# Patient Record
Sex: Female | Born: 1935 | Race: White | Hispanic: No | Smoking: Former smoker
Health system: Southern US, Community
[De-identification: ages and names within clinical notes are randomized; demographics above are authoritative.]

## PROBLEM LIST (undated history)

## (undated) DIAGNOSIS — K219 Gastro-esophageal reflux disease without esophagitis: Secondary | ICD-10-CM

## (undated) DIAGNOSIS — J449 Chronic obstructive pulmonary disease, unspecified: Secondary | ICD-10-CM

## (undated) DIAGNOSIS — K5900 Constipation, unspecified: Secondary | ICD-10-CM

## (undated) DIAGNOSIS — I10 Essential (primary) hypertension: Secondary | ICD-10-CM

## (undated) DIAGNOSIS — I739 Peripheral vascular disease, unspecified: Secondary | ICD-10-CM

## (undated) DIAGNOSIS — E782 Mixed hyperlipidemia: Secondary | ICD-10-CM

## (undated) DIAGNOSIS — I340 Nonrheumatic mitral (valve) insufficiency: Secondary | ICD-10-CM

## (undated) DIAGNOSIS — G4762 Sleep related leg cramps: Secondary | ICD-10-CM

## (undated) DIAGNOSIS — E039 Hypothyroidism, unspecified: Secondary | ICD-10-CM

## (undated) DIAGNOSIS — F419 Anxiety disorder, unspecified: Secondary | ICD-10-CM

## (undated) DIAGNOSIS — R0602 Shortness of breath: Secondary | ICD-10-CM

## (undated) DIAGNOSIS — I2510 Atherosclerotic heart disease of native coronary artery without angina pectoris: Secondary | ICD-10-CM

## (undated) DIAGNOSIS — Z9289 Personal history of other medical treatment: Secondary | ICD-10-CM

## (undated) DIAGNOSIS — N390 Urinary tract infection, site not specified: Secondary | ICD-10-CM

## (undated) DIAGNOSIS — IMO0002 Reserved for concepts with insufficient information to code with codable children: Secondary | ICD-10-CM

## (undated) DIAGNOSIS — Z860100 Personal history of colon polyps, unspecified: Secondary | ICD-10-CM

## (undated) DIAGNOSIS — J45909 Unspecified asthma, uncomplicated: Secondary | ICD-10-CM

## (undated) DIAGNOSIS — M069 Rheumatoid arthritis, unspecified: Secondary | ICD-10-CM

## (undated) DIAGNOSIS — H9190 Unspecified hearing loss, unspecified ear: Secondary | ICD-10-CM

## (undated) HISTORY — PX: ABDOMINAL HYSTERECTOMY: SHX81

## (undated) HISTORY — PX: EYE SURGERY: SHX253

## (undated) HISTORY — PX: FOOT SURGERY: SHX648

## (undated) HISTORY — PX: CATARACT EXTRACTION, BILATERAL: SHX1313

## (undated) HISTORY — PX: VASCULAR SURGERY: SHX849

---

## 2006-04-27 HISTORY — PX: COLONOSCOPY W/ POLYPECTOMY: SHX1380

## 2010-04-27 HISTORY — PX: CERVICAL FUSION: SHX112

## 2010-09-10 ENCOUNTER — Ambulatory Visit (HOSPITAL_COMMUNITY)
Admission: RE | Admit: 2010-09-10 | Discharge: 2010-09-10 | Disposition: A | Discharge status: Home / Self Care | Payer: Medicare Other | Source: Ambulatory Visit | Attending: Specialist | Admitting: Specialist

## 2010-09-10 ENCOUNTER — Encounter (HOSPITAL_COMMUNITY)
Admission: RE | Admit: 2010-09-10 | Discharge: 2010-09-10 | Disposition: A | Discharge status: Home / Self Care | Payer: Medicare Other | Source: Ambulatory Visit | Attending: Specialist | Admitting: Specialist

## 2010-09-10 ENCOUNTER — Other Ambulatory Visit (HOSPITAL_COMMUNITY): Payer: Self-pay | Admitting: Specialist

## 2010-09-10 DIAGNOSIS — M4800 Spinal stenosis, site unspecified: Secondary | ICD-10-CM

## 2010-09-10 DIAGNOSIS — Z01811 Encounter for preprocedural respiratory examination: Secondary | ICD-10-CM | POA: Insufficient documentation

## 2010-09-10 DIAGNOSIS — J438 Other emphysema: Secondary | ICD-10-CM | POA: Insufficient documentation

## 2010-09-10 DIAGNOSIS — Z01818 Encounter for other preprocedural examination: Secondary | ICD-10-CM | POA: Insufficient documentation

## 2010-09-10 DIAGNOSIS — Z01810 Encounter for preprocedural cardiovascular examination: Secondary | ICD-10-CM | POA: Insufficient documentation

## 2010-09-10 DIAGNOSIS — Z01812 Encounter for preprocedural laboratory examination: Secondary | ICD-10-CM | POA: Insufficient documentation

## 2010-09-10 DIAGNOSIS — M4802 Spinal stenosis, cervical region: Secondary | ICD-10-CM | POA: Insufficient documentation

## 2010-09-10 LAB — COMPREHENSIVE METABOLIC PANEL
ALT: 21 U/L (ref 0–35)
AST: 22 U/L (ref 0–37)
Albumin: 4.1 g/dL (ref 3.5–5.2)
Alkaline Phosphatase: 63 U/L (ref 39–117)
BUN: 10 mg/dL (ref 6–23)
Chloride: 106 mEq/L (ref 96–112)
GFR calc Af Amer: >60 mL/min (ref >60)
Potassium: 4.3 mEq/L (ref 3.5–5.1)
Sodium: 141 mEq/L (ref 135–145)
Total Bilirubin: 0.5 mg/dL (ref 0.3–1.2)

## 2010-09-10 LAB — PROTIME-INR
INR: 0.92 (ref 0.00–1.49)
Prothrombin Time: 12.6 seconds (ref 11.6–15.2)

## 2010-09-10 LAB — DIFFERENTIAL
Basophils Absolute: 0.1 10*3/uL (ref 0.0–0.1)
Basophils Relative: 1 % (ref 0–1)
Eosinophils Absolute: 0 10*3/uL (ref 0.0–0.7)
Eosinophils Relative: 1 % (ref 0–5)
Lymphocytes Relative: 39 % (ref 12–46)

## 2010-09-10 LAB — URINALYSIS, ROUTINE W REFLEX MICROSCOPIC
Nitrite: NEGATIVE
Protein, ur: NEGATIVE mg/dL
Specific Gravity, Urine: 1.012 (ref 1.005–1.030)
Urobilinogen, UA: 0.2 mg/dL (ref 0.0–1.0)

## 2010-09-10 LAB — CBC
Platelets: 223 10*3/uL (ref 150–400)
RDW: 12.4 % (ref 11.5–15.5)
WBC: 7.2 10*3/uL (ref 4.0–10.5)

## 2010-09-10 LAB — TYPE AND SCREEN

## 2010-09-12 ENCOUNTER — Inpatient Hospital Stay (HOSPITAL_COMMUNITY)
Admission: RE | Admit: 2010-09-12 | Discharge: 2010-09-15 | DRG: 472 | Disposition: A | Discharge status: Home Health | Payer: Medicare Other | Source: Ambulatory Visit | Attending: Specialist | Admitting: Specialist

## 2010-09-12 ENCOUNTER — Inpatient Hospital Stay (HOSPITAL_COMMUNITY): Payer: Medicare Other

## 2010-09-12 DIAGNOSIS — J449 Chronic obstructive pulmonary disease, unspecified: Secondary | ICD-10-CM | POA: Diagnosis present

## 2010-09-12 DIAGNOSIS — M4802 Spinal stenosis, cervical region: Principal | ICD-10-CM | POA: Diagnosis present

## 2010-09-12 DIAGNOSIS — E785 Hyperlipidemia, unspecified: Secondary | ICD-10-CM | POA: Diagnosis present

## 2010-09-12 DIAGNOSIS — I10 Essential (primary) hypertension: Secondary | ICD-10-CM | POA: Diagnosis present

## 2010-09-12 DIAGNOSIS — J4489 Other specified chronic obstructive pulmonary disease: Secondary | ICD-10-CM | POA: Diagnosis present

## 2010-09-12 DIAGNOSIS — M7500 Adhesive capsulitis of unspecified shoulder: Secondary | ICD-10-CM | POA: Diagnosis present

## 2010-09-12 DIAGNOSIS — M4712 Other spondylosis with myelopathy, cervical region: Secondary | ICD-10-CM | POA: Diagnosis present

## 2010-10-03 NOTE — Op Note (Signed)
NAME:  Amber Yang, Amber Yang                   ACCOUNT NO.:  [id number]  MEDICAL RECORD NO.:  [id number]           PATIENT TYPE:  I  LOCATION:  5031                         FACILITY:  [facility]  PHYSICIAN:  Kerrin Champagne, M.D.   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  PROCEDURE: 1. Cervical spine corpectomy C4 with decompression of the C3-4 and C4-     5 disk spaces and foraminotomy of both C4 and both C5 nerve roots. 2. Decompression of the central portions of the cervical canal     extending from C3 to C5. 3. Anterior cervical diskectomy and fusion at C5-6 with central     decompression at this level and bilateral foraminotomies of the C6     nerve roots. 4. Allograft, strut graft for fusion at the C3-C5 level and allograft     bone graft at the C5-6 level. 5. Internal fixation of the cervical spine extending from C3-C6, a     total of three disk levels and four vertebral bodies using a eight     hole 38 mm length Alphatec cervical plate and six 78-GN screws. 6. Closed manipulation of the left shoulder under general anesthetic.  ANESTHESIA:  General via orotracheal intubation, Dr. Laverle Hobby, supplemented with local infiltration of the left neck with a total of 10 mL of Marcaine, 0.5% with 1:200,000 epinephrine, left shoulder injected with 25 mL of Marcaine, 0.5% with 1:200,000 epinephrine, and 1 mL of Depo-Medrol 40 mg/mL.  SURGEON:  Kerrin Champagne,  MD.  ASSISTANT:  Wende Neighbors, PA.  ESTIMATED BLOOD LOSS:  Less than 75 mL.  DRAINS:  TLS drain 10-French, anterior neck.  Foley to straight drain.  BRIEF CLINICAL HISTORY:  This patient is a 77 year old right-handed female.  She has been seen by Dr. August Saucer previously for problems of shoulder pain, stiffness, found to have a frozen shoulder involving her left shoulder and weakness.  Findings consistent with cervical radiculopathy with severe neck pain, upper extremity numbness.  She is developing an ataxic gait with multiple falls occurring.  MRI scan that showed edema of the cervical cord at the C3-4 and C4-5 levels secondary to severe cervical stenosis at these levels.  Moderate stenosis also present at the C5-6 level.  She was seen in the office, evaluated, felt to have significant findings requiring cervical decompression surgery. She understands the risks and benefits of this procedure and signed informed consent.  DESCRIPTION OF PROCEDURE:  The patient was brought to the operating room via her stretcher, seen in the preoperative holding area where she had marking of her left neck and left shoulder with  an X and my initial. Skin crease on the upper neck was to be used on this left side. Following the transport to OR room #4, which was used for this procedure.  The patient was then transferred to the OR table.  There, she underwent induction of general anesthesia.  Foley catheter was placed.  The neck was placed on the cervical halter traction [place] head rest in minimal extension.  A 5-pound cervical halter traction was used.  Beach-chair type position was used.  The legs placed, well padded with PAS stockings to prevent DVT.  She had a standard prep over the anterior aspect of the cervical spine using DuraPrep solution.  Draped in the usual manner.  Standard time-out protocol was carried out, identifying the patient, procedure for neck and left shoulder.  With this, then  an incision was made in line with the patient's skin crease at about the C4 level just above the patient's thyroid cartilage line with the skin crease.  Skin had been infiltrated with Marcaine 0.5% with 1:200,000 epinephrine, total of 5-10 mL.  Incision through skin and subcu layers down to the platysma layer incised in line with the skin incision and spread.  The interval between the trachea and esophagus medially and the sternocleidomastoid laterally was carefully developed. An exposure obtained both inferiorly and superiorly along the anterior border of the sternocleidomastoid.  The omohyoid muscle identified carefully, freed up, retracted, then resected, and the interval then between the trachea, the esophagus, and the carotid sheath was then plainly visible.  A blunt dissection was then carried to the anterior aspect of the cervical spine extending from C3-C5.  Spinal needles were placed at the C3-4 and C4-5 levels.  The upper needle noted to be at C2- 3 and the lower one at the C3-4 level.  So that the areas of C3-4, C4-5, C5-6 were then carefully marked with incision of the disk using 15 blade scalpels and removed a small portion of the disk for continued identification of these levels.  Medial border longus colli muscles carefully freed up with electrocautery and Key elevators, so that a [employer] retractor was then able to be inserted into the incision and further the blade placed beneath the medial border longus colli muscle of the C4 level bilaterally.  A 14-mm screw posts were then placed into the anterior aspect of the vertebral body of C3 and the anterior superior aspect of the vertebral body of C5.  Distraction was obtained. Disk space at C3-4 and then C4-5 were then further incised using a 15 blade scalpel.  Loupe magnification headlamp used for this portion of the procedure.  The anterior annular material was resected using pituitary rongeurs and the disk space  debrided of degenerative disk material using micro curettes, forward straight 3-0, 2-0, as well as forward angle micro curettes.  The endplates in the inferior aspect of C3 and superior aspect of C5 were debrided with cartilaginous material. Central portions of the disk anteriorly at the C4 level was then resected using Leksell rongeur.  Note that the patient's midline was identified prior to beginning the resection and that the screw posts were placed in the center portion of the vertebral body of both C3 and C5 in order to maintain alignment in the post alignment for resection of the vertebral body.  The vertebral body was resected posteriorly using Leksell rongeur until the posterior cortex was then encountered.  Bone wax was applied to the bleeding cancellous bone surfaces.  The operating room  microscope was then sterilely draped, brought into the field, and under the operating room microscope then a 4-mm bur was then able to be used to carefully thin the posterior cortex in the midline at the C4 level.  This was carefully thinned.  Additionally, then a sounder normally used to sound for the height of the intervertebral disk space for anterior diskectomy and fusion purposes was used, placed within the trough made for the corpectomy, this measured from side-to-side nearly 14 mm.  So that an additional millimeter on each side was resected further using a high-speed bur to decompress the central corpectomy site posteriorly at least 16-17 mm.  This was carried out carefully ensuring that no broaching of the cortex occurred laterally in order to prevent injury to the vertebral arteries.  The posterior cortical bone was then further thinned using a high-speed bur until areas of light penetration of the posterior cortex ventral to the posterior longitudinal ligament had occurred.  At this point, then the posterior cortex of the vertebral body of three was able to be resected using micro  curettes, there were forward angle with 4-0 and 3-0, as well as 1 mm Kerrisons, this entire trough over its posterior aspect was then carefully resected both centrally and then left to the right side.  Once this was completed, then additional annular material at C3-4 and C4-5 levels were resected using 1-mm and 2-mm Kerrisons.  Foraminotomies were performed at the C3- 4 level, resecting the uncovertebral spurs in the right and left side using 1-2 mm Kerrisons, the uncovertebral joint itself debrided of cartilaginous material and allow for viewing purposes of the exiting C4 nerve root as it exited laterally and anteriorly, then the neural foramen demonstrating it of a well decompressed.  Additionally, this was carried out of the C4-5 level.  So that resection of the uncovertebral joints at C4-5 were carried out and that this decompressed the C5 nerve roots bilaterally within their foramen until the nerve root was noted to be exiting ventral and lateral to the vertebral spurs that had been resected.  Bleeders were controlled using bipolar electrocautery. Thrombin-soaked Gelfoam were appropriate.  The width of the trough was noted and it has been nearly 17 mm.  The depth of the trough noted at its upper extent to be about 16 mm, its distal extent noted nearly 20 mm.  A 15-mm graft was chosen.  Graft to be subset over the lower portion of the trough at the C5 upper end plate level and to be tapered from inferior to superior so the upper aspect at the anterior portion of the bone graft against the anterior cortex of the inferior aspect of C3. This graft and the height was measured as well as depth, carefully cut using oscillating saw, high-speed bur, then used to remove cortical bone superiorly and inferiorly, so that the graft could be keyed into the undersurface of C3 and the upper surface of C5.  Irrigation was carried out of the area of the trough.  Bleeding was well controlled.  There  was no active bleeding present, so that the graft was then carefully placed into the trough and carefully impacted then first over its inferior margin and superior margins, carefully aligned the graft to be set into the trough and into the area of previous corpectomy, performing strutting between the C3-C5 levels.  Note, that an additional distraction was not necessary and the graft appeared to seat quite nicely in good position alignment.  Additional bone graft was  then placed along the lateral aspect of the trough bone graft that been harvested from the central corpectomy area.  Screw post at C3 was then removed and this was then placed at the C6 level after careful exposure down to C6 exposing and then carefully elevating the medial border longus colli muscle at C5-6.  The [facility] retractor was removed and replaced at the C5-6 level.  Further the blade beneath the medial border longus colli muscle for retraction purposes and the 14-mm screw post placed into the C6 vertebral body.  Distraction obtained between C5 and C6 and the anterior aspect of this was further debrided degenerative disk material using 15 blade scalpel.  Pituitary rongeurs were used to remove anterior disk and bone.  The operating room microscope had been draped sterilely for resection and the posterior cortex at the C4 level was brought to the C5-6 level and used for purposes of debridement of the degenerative disk from the C5-6 level back to the posterior lip osteophytes at this level.  High-speed bur was then used to carefully remove cartilaginous and bone endplates over the inferior aspect of C5, superior aspect of C6, back to the bony endplate lips, these were further thinned, so that then a small curette was able to be introduced to remove bone from posterior lip osteophytes and then 1 mm Kerrison passed proximal or superior to the endplate osteophytes over the inferior-posterior aspect of C5, resecting  the bony bridge from side-to- side.  This was then removed.  Additionally, then Kerrison was able to be reduced over the posterior aspect of the posterior-superior lip of C6 and used to resect the osteophytes here on an 2-mm Kerrison. Foraminotomy was performed at both C6 nerve roots using 1-2-mm Kerrison, size to be bur would necessary.  Nerve roots noted to be exiting then following decompression ventral and lateral without further compression vertebral spurs.  Height of this intervertebral disk space was measured at about 7-8 mm.  A portion of the previous allograft had been used for the purposes of grafting at the C3-4 to C4-5 level, was then cut to the specific dimensions for this level using a high-speed oscillating saw. The depth of the intervertebral disk space measuring a nearly 20 mm.  A 15-mm graft was chosen.  This was carefully tapered to the dimensions of intervertebral disk space.  Irrigation was carried out of the disk space and care was taken to ensure there was no bony debris or soft tissues to be retropulsed with insertion graft.  Graft was then carefully inserted and packed into place, carefully subset beneath the anterior border of the intervertebral disk space by about 2-mm.  Screw posts were then removed at [employer].  Bone wax applied to bleeding screw post holes.  The expected distance between the inferior aspect of C3 and superior aspect of C5 was carefully measured.  First a 40-mm plate was chosen, but did appear to be too long so a 38-mm length screw and plate set up was chosen with eight holes.  This was placed against the anterior aspect of the cervical spine following carefully burring using a high-speed bur over both the C3 inferior aspect, the superior aspect of C5, inferior aspect of C5, superior aspect of C6 to remove lip osteophytes that would prevent the plate from seating.  Following this, then a retaining screw was placed at the C5 level on the left side  in order to align the plate carefully.  Screws were then placed in the inferior  aspect of the plate at the C6 level, both screws were placed.  Traction was released on the patient's cervical spine.  Additional two screws were then placed at the C5 level removing the retaining pin, drilling to positive stop 14 mm and placing appropriate screw at each segment.  Locking mechanism noted to take place at both C5-C6 and four screws present.  Excellent purchase of bone was preserved.  Final screws were placed at the upper level of C3. These were drilled using a 40-mm drill as well and the appropriate self- tapping screws placed obtaining excellent fixation at C3. Intraoperative lateral radiograph obtained documenting the plates and screws in good position alignment with no evidence of impingement on cervical canal.  The grafts at both the C4 corpectomy site and at C5-6 appeared to be in excellent position alignment with no sign of retropulsion.  Irrigation was then carried out of the soft tissues of the anterior aspect of the cervical spine.  Careful inspection of the esophagus demonstrated no abnormalities.  Soft tissues allowed to fall back into place.  A 10-French talus drain placed at the incision exiting over the lower anterior aspect of the cervical spine below the incision site.  The omohyoid muscle was then reapproximated with interrupted 2-0 Vicryl sutures.  Platysma layer approximated with interrupted 2-0 Vicryl sutures.  Subcu layers of skin approximated with interrupted 3-0 Vicryl suture.  The skin closed with a running subcu stitch of 4-0 Vicryl.  The patient then had Dermabond applied and Mepilex bandage.  The TLS drain was sewn in place using a 4-0 nylon stitch.  Drain was attached to a red top tube.  The patient had [place] collar applied.  This completed the cervical spine portion of the procedure, so the attention was turned to the left shoulder, prep was performed over the  anterior aspect of left shoulder with Betadine spray and this was then draped.  A 25-gauge needle, 1.5 inches in length was used to instill 25 mL of Marcaine, 0.5% with 1:200,000 epinephrine and 1 mL of Depo-Medrol 40 mg/mL into the left shoulder joint proper.  This was performed at the intersection of the sagittal line with the [facility] joint and a horizontal line with the coracoid process.  This entered the joint nicely.  Aspiration showed no blood and the shoulder joint was then injected.  Following injection, then the left arm was then anteriorly flexed and abducted and brought to 180 degrees of the anterior flexion, so that the patient's elbow was at the level of her head.  This was done by carefully grasping proximal humerus and slowly allowing the gravity of the way to the OR to stretch the shoulder capsule until finally the shoulder capsule did give with audible and palpable giving of the tissue.  External rotation examined and about 80-90 degrees of internal rotation was found.  This represented a shoulder manipulation that was successful.  The arm was then brought down to the side and the patient placed into a sling.  The patient then was reactivated and returned to her bed, extubated and returned to the recovery room in satisfactory condition.  All instrument and sponge counts were correct.  Physician assistant's responsibilities Maud Deed, performed duties of an Designer, television/film set and she was present from the beginning of this case to the very end.  She performed the duties of Designer, television/film set and performing suctioning of carefully neural elements in this patient's cervical cord and nerve roots at the C4 level, the level of corpectomy,  C3-4, C4-5 levels, decompressions and foraminotomies were carried out as well as C5-6.  She was present at the beginning of the case with the transfer of this patient to the OR bed and performed closure end of the decompression of this procedure.   She also participated in the introduction of screw fixation of the plate screws during the cervical spine plate fixation portion.  She held the graft while graft was cut for both purposes of the graft at the C5-6 level as well as the corpectomy site.  She performed surgery assistance under the operating room microscope.  At the end of the case, she was present for the transfer the patient to the OR table.  She performed the closure of the patient's neck from the platysma layer to the skin.     Kerrin Champagne, M.D.     JEN/MEDQ  D:  09/18/2010  T:  09/19/2010  Job:  [id number]  Electronically Signed by Vira Browns M.D. on 10/03/2010 07:48:25 PM

## 2010-11-04 NOTE — Discharge Summary (Signed)
Amber Yang, Amber Yang NO.:  [id number]  MEDICAL RECORD NO.:  [id number]  LOCATION:  5031                         FACILITY:  [facility]  PHYSICIAN:  Kerrin Champagne, M.D.   DATE OF BIRTH:  [birth year]  DATE OF ADMISSION:  09/12/2010 DATE OF DISCHARGE:  09/15/2010                              DISCHARGE SUMMARY   ADMISSION DIAGNOSES: 1. Severe cervical spine stenosis at C3-4 and C4-5, moderate stenosis     at C5-6 with cervical spondylosis involving both C4 and C5 nerve     roots and both C6 nerve roots. 2. Left shoulder adhesive capsulitis. 3. Carotid stenosis. 4. Asthma. 5. Hyperlipidemia. 6. Vitamin D deficiency. 7. History of dysphagia and gastroesophageal reflux disease. 8. Chronic obstructive pulmonary disease. 9. Degenerative disk disease of the lumbar spine at L4-5.  DISCHARGE DIAGNOSES: 1. Severe cervical spine stenosis at C3-4 and C4-5, moderate stenosis     at C5-6 with cervical spondylosis involving both C4 and C5 nerve     roots and both C6 nerve roots. 2. Left shoulder adhesive capsulitis. 3. Carotid stenosis. 4. Asthma. 5. Hyperlipidemia. 6. Vitamin D deficiency. 7. History of dysphagia and gastroesophageal reflux disease. 8. Chronic obstructive pulmonary disease. 9. Degenerative disk disease of the lumbar spine at L4-5.  PROCEDURES:  On Sep 12, 2010, the patient underwent 1. Cervical spine corpectomy C4 with decompression of the C3-4 and C4-     5 disk space with foraminotomy at both C4 and C5 nerve roots. 2. Decompression central portions of the cervical canal extending from     C3-C5. 3. Anterior cervical diskectomy and fusion at C5-6 with central     decompression at this level and bilateral foraminotomies of C6     nerve roots. 4. Allograft strut graft for fusion at C3-C5 level with allograft bone     graft at the C5-6 level. 5. Internal fixation of the cervical spine extending from C3-C6.  A     total of 3 disk levels and 4 vertebral  bodies using an 8 hole     Alphatec cervical plate. 6. Closed manipulation of the left shoulder under general anesthesia.     These were performed under general anesthesia by Dr. Otelia Sergeant assisted     by Maud Deed [facility].  CONSULTATIONS:  None.  BRIEF HISTORY:  The patient is a 77 year old right-handed female initially treated for frozen shoulder on the left.  She was also noted to have weakness in the left shoulder.  She underwent evaluation with MRI scan showing edema of the cervical cord at C3-4 and C4-5 level secondary to severe cervical stenosis at these levels.  She was also noted to have moderate stenosis at the C5-6 level.  The patient had exam findings consistent with cervical radiculopathy with severe neck pain and upper extremity numbness and development of an ataxic gait with multiple falls.  It was felt that she would require surgical intervention and after the risk and benefits were discussed, the patient was admitted to proceed with the above-stated procedure.  BRIEF HOSPITAL COURSE:  The patient tolerated the procedure under general anesthesia without complications.  Postoperatively, she continued to have some weakness  with left shoulder abduction.  This was felt to be related to the intraarticular injection to the left shoulder during surgery with Marcaine and Depo-Medrol.  Eventually, this did improve very gradually through the hospital stay.  The patient's drain was removed from her neck on the first postoperative day.  She did have a small amount of bloody drainage from the drain site and this did resolve while in the hospital.  Occupational therapy was initiated for range of motion of the left shoulder as well as activities of daily living.  The patient required IV narcotics initially and was weaned to p.o. analgesics without difficulty.  The patient was able to void once her Foley catheter was discontinued.  She was able to ambulate in the hallway without  difficulty.  An [place] collar was used to the cervical spine continuously.  She was given a [place] collar to use for showering purposes once allowed.  The patient was taking a regular diet at discharge without difficulty swallowing.  She was afebrile and vital signs were stable and the patient was able to be discharged to her home on postop day #3.  Pertinent laboratory values, there are no laboratory values listed in the chart under access anywhere for this dictation.  X- ray of the left humerus on May 18 showed the humerus to be intact without fracture, no dislocation.  Postoperative films of the cervical spine showed fusion C3-C6.  PLAN:  The patient was discharged to her home.  She was given instructions to wear her collar at all times.  She will continue on a liquid or soft diet as long as she has any of the sore throat.  She will keep her incision dry and clean for 5 days and then she will be allowed to shower using the [place] collar.  No lifting or overhead use of her arms.  No driving for 2 weeks.  She will follow up with Dr. Otelia Sergeant 2 weeks from the date of surgery.  For her left shoulder, she should continue with range of motion exercises as instructed by the occupational therapist.  MEDICATIONS AT DISCHARGE: 1. Chloraseptic spray for her throat as needed. 2. Robaxin 500 mg one every 8 hours as needed for spasm. 3. Percocet 5/325 one every 6 hours as needed for pain.  She will     resume home medications as taken prior to admission.  All questions     encouraged and answered.  CONDITION ON DISCHARGE:  Stable.    Wende Neighbors, P.A.   ______________________________ Kerrin Champagne, M.D.   SMV/MEDQ  D:  09/29/2010  T:  09/29/2010  Job:  [id number]  cc:   Kerrin Champagne, M.D.  Electronically Signed by Dorna Mai. on 10/07/2010 12:55:57 PM Electronically Signed by Vira Browns M.D. on 11/04/2010 11:06:53 AM

## 2012-09-29 ENCOUNTER — Other Ambulatory Visit: Payer: Self-pay | Admitting: Specialist

## 2012-09-29 DIAGNOSIS — M4306 Spondylolysis, lumbar region: Secondary | ICD-10-CM

## 2012-10-06 ENCOUNTER — Ambulatory Visit
Admission: RE | Admit: 2012-10-06 | Discharge: 2012-10-06 | Disposition: A | Discharge status: Home / Self Care | Payer: Medicare Other | Source: Ambulatory Visit | Attending: Specialist | Admitting: Specialist

## 2012-10-06 DIAGNOSIS — M4306 Spondylolysis, lumbar region: Secondary | ICD-10-CM

## 2012-12-20 DIAGNOSIS — Z9289 Personal history of other medical treatment: Secondary | ICD-10-CM

## 2012-12-20 HISTORY — DX: Personal history of other medical treatment: Z92.89

## 2013-01-27 DIAGNOSIS — Z9289 Personal history of other medical treatment: Secondary | ICD-10-CM

## 2013-01-27 HISTORY — DX: Personal history of other medical treatment: Z92.89

## 2013-05-28 DIAGNOSIS — N390 Urinary tract infection, site not specified: Secondary | ICD-10-CM

## 2013-05-28 HISTORY — PX: CAROTID ENDARTERECTOMY: SUR193

## 2013-05-28 HISTORY — DX: Urinary tract infection, site not specified: N39.0

## 2013-10-16 ENCOUNTER — Other Ambulatory Visit: Payer: Self-pay | Admitting: Specialist

## 2013-10-16 DIAGNOSIS — M545 Low back pain: Secondary | ICD-10-CM

## 2013-10-20 ENCOUNTER — Ambulatory Visit
Admission: RE | Admit: 2013-10-20 | Discharge: 2013-10-20 | Disposition: A | Discharge status: Home / Self Care | Payer: Medicare Other | Source: Ambulatory Visit | Attending: Specialist | Admitting: Specialist

## 2013-10-20 DIAGNOSIS — M545 Low back pain: Secondary | ICD-10-CM

## 2013-11-03 ENCOUNTER — Other Ambulatory Visit (HOSPITAL_COMMUNITY): Payer: Self-pay | Admitting: Specialist

## 2013-11-13 NOTE — Pre-Procedure Instructions (Signed)
DEANNDRA KIRLEY  11/13/2013   Your procedure is scheduled on:  Tuesday, July 28th  Report to [facility] Admitting at 0800 AM.  Call this number if you have problems the morning of surgery: [phone]   Remember:   Do not eat food or drink liquids after midnight.Monday night   Take these medicines the morning of surgery with A SIP OF WATER:  Symbicort, Thyroid medication, amlodipine for blood pressure, Hydrocodone if needed   Do not wear jewelry, make-up or nail polish.  Do not wear lotions, powders, or perfumes. You may wear deodorant.  Do not shave 48 hours prior to surgery. Men may shave face and neck.  Do not bring valuables to the hospital.  [facility] is not responsible  for any belongings or valuables.               Contacts, dentures or bridgework may not be worn into surgery.  Leave suitcase in the car. After surgery it may be brought to your room.  For patients admitted to the hospital, discharge time is determined by your treatment team.      Please read over the following fact sheets that you were given: Pain Booklet, Coughing and Deep Breathing, Blood Transfusion Information, MRSA Information and Surgical Site Infection Prevention    [place] - Preparing for Surgery  Before surgery, you can play an important role.  Because skin is not sterile, your skin needs to be as free of germs as possible.  You can reduce the number of germs on you skin by washing with CHG (chlorahexidine gluconate) soap before surgery.  CHG is an antiseptic cleaner which kills germs and bonds with the skin to continue killing germs even after washing.  Please DO NOT use if you have an allergy to CHG or antibacterial soaps.  If your skin becomes reddened/irritated stop using the CHG and inform your nurse when you arrive at Short Stay.  Do not shave (including legs and underarms) for at least 48 hours prior to the first CHG shower.  You may shave your face.  Please follow these  instructions carefully:   1.  Shower with CHG Soap the night before surgery and the morning of Surgery.  2.  If you choose to wash your hair, wash your hair first as usual with your normal shampoo.  3.  After you shampoo, rinse your hair and body thoroughly to remove the shampoo.  4.  Use CHG as you would any other liquid soap.  You can apply CHG directly to the skin and wash gently with scrungie or a clean washcloth.  5.  Apply the CHG Soap to your body ONLY FROM THE NECK DOWN.  Do not use on open wounds or open sores.  Avoid contact with your eyes, ears, mouth and genitals (private parts).  Wash genitals (private parts) with your normal soap.  6.  Wash thoroughly, paying special attention to the area where your surgery will be performed.  7.  Thoroughly rinse your body with warm water from the neck down.  8.  DO NOT shower/wash with your normal soap after using and rinsing off the CHG Soap.  9.  Pat yourself dry with a clean towel.            10.  Wear clean pajamas.            11.  Place clean sheets on your bed the night of your first shower and do not sleep with  pets.  Day of Surgery  Do not apply any lotions/deoderants the morning of surgery.  Please wear clean clothes to the hospital/surgery center.

## 2013-11-14 ENCOUNTER — Encounter (HOSPITAL_COMMUNITY)
Admission: RE | Admit: 2013-11-14 | Discharge: 2013-11-14 | Disposition: A | Discharge status: Home / Self Care | Payer: Medicare Other | Source: Ambulatory Visit | Attending: Specialist | Admitting: Specialist

## 2013-11-14 ENCOUNTER — Encounter (HOSPITAL_COMMUNITY): Payer: Self-pay

## 2013-11-14 ENCOUNTER — Encounter (HOSPITAL_COMMUNITY)
Admission: RE | Admit: 2013-11-14 | Discharge: 2013-11-14 | Disposition: A | Discharge status: Home / Self Care | Payer: Medicare Other | Source: Ambulatory Visit | Attending: Anesthesiology | Admitting: Anesthesiology

## 2013-11-14 ENCOUNTER — Encounter (HOSPITAL_COMMUNITY): Payer: Self-pay | Admitting: Pharmacy Technician

## 2013-11-14 DIAGNOSIS — Z01812 Encounter for preprocedural laboratory examination: Secondary | ICD-10-CM | POA: Diagnosis not present

## 2013-11-14 DIAGNOSIS — Z01818 Encounter for other preprocedural examination: Secondary | ICD-10-CM | POA: Diagnosis present

## 2013-11-14 HISTORY — DX: Shortness of breath: R06.02

## 2013-11-14 HISTORY — DX: Chronic obstructive pulmonary disease, unspecified: J44.9

## 2013-11-14 HISTORY — DX: Nonrheumatic mitral (valve) insufficiency: I34.0

## 2013-11-14 HISTORY — DX: Sleep related leg cramps: G47.62

## 2013-11-14 HISTORY — DX: Unspecified hearing loss, unspecified ear: H91.90

## 2013-11-14 HISTORY — DX: Personal history of colonic polyps: Z86.010

## 2013-11-14 HISTORY — DX: Reserved for concepts with insufficient information to code with codable children: IMO0002

## 2013-11-14 HISTORY — DX: Personal history of colon polyps, unspecified: Z86.0100

## 2013-11-14 HISTORY — DX: Urinary tract infection, site not specified: N39.0

## 2013-11-14 HISTORY — DX: Atherosclerotic heart disease of native coronary artery without angina pectoris: I25.10

## 2013-11-14 HISTORY — DX: Mixed hyperlipidemia: E78.2

## 2013-11-14 HISTORY — DX: Anxiety disorder, unspecified: F41.9

## 2013-11-14 HISTORY — DX: Unspecified asthma, uncomplicated: J45.909

## 2013-11-14 HISTORY — DX: Constipation, unspecified: K59.00

## 2013-11-14 HISTORY — DX: Hypothyroidism, unspecified: E03.9

## 2013-11-14 HISTORY — DX: Personal history of other medical treatment: Z92.89

## 2013-11-14 HISTORY — DX: Rheumatoid arthritis, unspecified: M06.9

## 2013-11-14 HISTORY — DX: Peripheral vascular disease, unspecified: I73.9

## 2013-11-14 HISTORY — DX: Essential (primary) hypertension: I10

## 2013-11-14 HISTORY — DX: Gastro-esophageal reflux disease without esophagitis: K21.9

## 2013-11-14 LAB — URINALYSIS, ROUTINE W REFLEX MICROSCOPIC
BILIRUBIN URINE: NEGATIVE
Glucose, UA: NEGATIVE mg/dL
HGB URINE DIPSTICK: NEGATIVE
Ketones, ur: NEGATIVE mg/dL
Leukocytes, UA: NEGATIVE
Nitrite: NEGATIVE
PROTEIN: NEGATIVE mg/dL
Specific Gravity, Urine: 1.01 (ref 1.005–1.030)
UROBILINOGEN UA: 0.2 mg/dL (ref 0.0–1.0)
pH: 5 (ref 5.0–8.0)

## 2013-11-14 LAB — COMPREHENSIVE METABOLIC PANEL
ALK PHOS: 69 U/L (ref 39–117)
ALT: 28 U/L (ref 0–35)
AST: 26 U/L (ref 0–37)
Albumin: 4.2 g/dL (ref 3.5–5.2)
Anion gap: 14 (ref 5–15)
BUN: 14 mg/dL (ref 6–23)
CO2: 25 meq/L (ref 19–32)
Calcium: 9.1 mg/dL (ref 8.4–10.5)
Chloride: 102 mEq/L (ref 96–112)
Creatinine, Ser: 0.79 mg/dL (ref 0.50–1.10)
GFR calc non Af Amer: 76 mL/min — ABNORMAL LOW (ref >90)
GFR, EST AFRICAN AMERICAN: 88 mL/min — AB (ref >90)
GLUCOSE: 93 mg/dL (ref 70–99)
Potassium: 4.9 mEq/L (ref 3.7–5.3)
Sodium: 141 mEq/L (ref 137–147)
Total Bilirubin: 0.8 mg/dL (ref 0.3–1.2)
Total Protein: 7.2 g/dL (ref 6.0–8.3)

## 2013-11-14 LAB — SURGICAL PCR SCREEN
MRSA, PCR: NEGATIVE
STAPHYLOCOCCUS AUREUS: NEGATIVE

## 2013-11-14 LAB — CBC
HCT: 44 % (ref 36.0–46.0)
Hemoglobin: 14.9 g/dL (ref 12.0–15.0)
MCH: 32.3 pg (ref 26.0–34.0)
MCHC: 33.9 g/dL (ref 30.0–36.0)
MCV: 95.2 fL (ref 78.0–100.0)
Platelets: 231 10*3/uL (ref 150–400)
RBC: 4.62 MIL/uL (ref 3.87–5.11)
RDW: 13.1 % (ref 11.5–15.5)
WBC: 8.6 10*3/uL (ref 4.0–10.5)

## 2013-11-14 LAB — TYPE AND SCREEN
ABO/RH(D): AB NEG
Antibody Screen: NEGATIVE

## 2013-11-15 NOTE — Progress Notes (Signed)
Anesthesia Chart Review:  Patient is a 80 year old female scheduled for Gill procedure, L4-5 posterolateral fusion,  L4-5, central decompression L3-4 on 11/21/13 by Dr. Otelia Sergeant.   History includes CAD, mild to moderate mitral regurgitation 01/2013 echo, HTN, severe COPD (not on home O2) with an element of asthma, former smoker, RA, hypothyroidism, PVD s/p right CEA 05/2013 anxiety, GERD, HLD, hearing loss, hysterectomy, cervical fusion, cataract extraction, colo polyps, nocturnal leg cramps. Cardiologist Dr. Lollie Marrow with [facility]) cleared patient for this procedure. Cardiologist is Dr. Kenney Houseman with [facility] who felt "she is in good shape for surgery, is at mildly increased risk of pulmonary complications." PCP is Annamary Rummage, PA-C who also cleared patient for surgery.   EKG on 10/18/13 [facility]) showed: SR, old anterior infarct, low voltage with rightward axis and rotation, possible pulmonary disease pattern.  Echo on 01/27/13 [facility]) showed: The mitral valve is normal. Mild to moderate mitral regurgitation. No evidence of pulmonary hypertension. Normal cardiac chamber sizes and function. Normal valve anatomy. No pericardial effusion. No intracardiac shunts. Normal thoracic aorta and aortic arch. LV size, wall thickness, and systolic function are normal with EF 60-65%.  Nuclear stress test on 12/20/12 [facility]) showed: Normal myocardial perfusion imaging. Overall left ventricular systolic function was normal without regional wall motion abnormalities. Left ventricular EF was 73%.  CXR on 11/14/13 showed: Cervical ACDF hardware now in place. Stable lung volumes. Normal cardiac size and mediastinal contours. Stable mild basilar predominant increased interstitial markings. No pneumothorax, pulmonary edema, pleural effusion or acute pulmonary opacity. No acute osseous abnormality identified.  IMPRESSION: No acute cardiopulmonary abnormality.  According to pulmonary notes, "PFTs  last August showed severe obstruction with bronchodilator response, air trapping and very severe diffusion defect."  She had follow-up spirometry ~ 09/2013, but report is pending.  (Requested)  6 minute walk test showed ability to walk six minutes with O2 sat of 93% with ambulation.  Carotid duplex on 02/02/13 [facility]) showed: 80-99% RICA stenosis.  40-59% LICA stenosis.  She has since undergone right CEA 05/2013.    Preoperative labs noted.    She has been cleared by her PCP, pulmonologist, and cardiologist.  If no acute changes then I would anticipate that she could proceed as planned.  Velna Ochs [facility]/Anesthesiology Phone [phone] 11/15/2013 2:45 PM

## 2013-11-17 NOTE — Progress Notes (Signed)
Spoke with Lanora Manis at [facility] and she will fax PFTs to [phone].

## 2013-11-17 NOTE — H&P (Signed)
Amber Yang is an 80 y.o. female.   Chief Complaint: back pain with radiation into her legs HPI: she is complaining of pain into her left hip and some radiation into her left leg.  She has been experiencing numbness in the left foot.  The pain starts along the left lateral aspect of her hip and then goes all of the way down.  It is not constant, but it is worse when she is standing and trying to walk.   The pain in her back is not constant and it improves when she is sitting.  At nighttime she experiences discomfort and it does keep her awake at night.    She has lumbar spinal stenosis with associated spondylolisthesis that is grade 1 nearly grade 2 at the L4-L5 level.  She has had persistent ongoing back pain and has been ready to undergo intervention surgically and we have discussed this with her and her family.   She has undergone pulmonary testing by her pulmonologist Dr. Lorri Frederick in [facility], these apparently have returned fairly stable.  She notes that she also has seen her primary care physician and Dr. Lollie Marrow.  She states that they have given her an okay to consider undergoing decompression for severe lumbar spinal stenosis with fusion to be done at the L4-5 level for anterior spondylolisthesis at this segment.    On her most recent MRI scan she has been noted to have some areas of foraminal stenosis on the right side at T12, moderate left at T12, disk osteophyte complex of the right at L1-2 with mild right lateral recess stenosis, but no significant central stenosis.  Mild right L1 foraminal stenosis.  At L2-3 she has a disk osteophyte complex with mild left lateral recess stenosis and spinal stenosis, borderline mild bilateral L2 foraminal stenosis.  At L3-4 severe multifactorial spinal stenosis due to chronic disk osteophyte complex, severe ligamentum flavum hypertrophy with moderate left greater than right facet hypertrophy, superimposed epidural lipomatosis, bilateral L3 foraminal  stenosis.  At L4-5 chronic severe and very severe multifactorial stenosis, chronic grade 1 anterolisthesis with circumferential pseudo disk bulge protrusion.  Severe facet ligamentum hypertrophy, moderate-to-severe bilateral foraminal stenosis.  At L5-S1 circumferential disk osteophyte complex, mild lateral recess stenosis on the left, moderate left and moderate-to-severe right L5 foraminal entrapment or stenosis present.    Past Medical History  Diagnosis Date  . Coronary artery disease   . Hypertension   . COPD (chronic obstructive pulmonary disease)   . Asthma   . Hypothyroidism   . Rheumatoid arthritis   . History of colon polyps   . Peripheral vascular disease     followed by Dr Sondra Come  . DDD (degenerative disc disease)   . Constipation   . Anxiety   . Nocturnal leg cramps   . GERD (gastroesophageal reflux disease)   . Hyperlipemia, mixed   . H/O echocardiogram 01/27/2013  . History of nuclear stress test 12/20/2012  . Hearing loss   . COPD, severe   . Shortness of breath     exertional  . UTI (urinary tract infection) 05/2013  . Mitral valve regurgitation     Past Surgical History  Procedure Laterality Date  . Abdominal hysterectomy    . Vascular surgery    . Carotid endarterectomy Right 05/2013  . Colonoscopy w/ polypectomy  2008  . Foot surgery Right   . Cervical fusion  2012  . Eye surgery    . Cataract extraction, bilateral  No family history on file. Social History:  reports that she quit smoking about 10 years ago. Her smoking use included Cigarettes. She has a 40 pack-year smoking history. She has never used smokeless tobacco. She reports that she does not drink alcohol or use illicit drugs.  Allergies:  Allergies  Allergen Reactions  . Bee Venom Swelling  . Codeine Swelling  . Sulfa Antibiotics Hives    Medications Prior to Admission  Medication Sig Dispense Refill  . albuterol (PROVENTIL HFA;VENTOLIN HFA) 108 (90 BASE) MCG/ACT inhaler Inhale 2 puffs  into the lungs every 6 (six) hours as needed for wheezing or shortness of breath.      . ALPRAZolam (XANAX) 0.5 MG tablet Take 0.5 mg by mouth 2 (two) times daily as needed for anxiety.      Marland Kitchen amLODipine (NORVASC) 5 MG tablet Take 5 mg by mouth daily.      Marland Kitchen aspirin 325 MG tablet Take 325 mg by mouth daily.      Marland Kitchen azelastine (ASTELIN) 0.1 % nasal spray Place 1 spray into both nostrils 2 (two) times daily. Use in each nostril as directed      . bisacodyl (DULCOLAX) 5 MG EC tablet Take 5 mg by mouth daily as needed for moderate constipation.      . budesonide-formoterol (SYMBICORT) 80-4.5 MCG/ACT inhaler Inhale 2 puffs into the lungs 2 (two) times daily.      . cholecalciferol (VITAMIN D) 1000 UNITS tablet Take 1,000 Units by mouth daily.      . flintstones complete (FLINTSTONES) 60 MG chewable tablet Chew 1 tablet by mouth daily.      . hydrochlorothiazide (HYDRODIURIL) 12.5 MG tablet Take 3.125 mg by mouth daily as needed. edema      . HYDROcodone-acetaminophen (NORCO/VICODIN) 5-325 MG per tablet Take 1 tablet by mouth every 6 (six) hours as needed for moderate pain.      Marland Kitchen levothyroxine (SYNTHROID, LEVOTHROID) 25 MCG tablet Take 25 mcg by mouth daily before breakfast.      . loratadine (CLARITIN) 10 MG tablet Take 10 mg by mouth daily.      . Lutein 20 MG CAPS Take 20 mg by mouth daily.      . Magnesium 250 MG TABS Take 250 mg by mouth daily.      . psyllium (METAMUCIL) 58.6 % powder Take 1 packet by mouth daily.      . Sennosides (EX-LAX) 15 MG CHEW Chew 1 each by mouth at bedtime as needed. Constipation        No results found for this or any previous visit (from the past 48 hour(s)). No results found.  Review of Systems  Constitutional: Negative.   HENT: Negative.   Eyes: Negative.   Respiratory: Negative.   Cardiovascular: Negative.   Gastrointestinal: Negative.   Genitourinary: Negative.   Musculoskeletal: Positive for back pain.  Skin: Negative.   Neurological: Positive for  focal weakness.       Both legs with ambulation  Endo/Heme/Allergies: Negative.   Psychiatric/Behavioral: Negative.     Blood pressure 116/65, pulse 94, temperature 98.6 F (37 C), temperature source Oral, resp. rate 18, height 5\' 1"  (1.549 m), weight 67.132 kg (148 lb), SpO2 97.00%. Physical Exam  Constitutional: She is oriented to person, place, and time. She appears well-developed and well-nourished.  HENT:  Head: Normocephalic and atraumatic.  Eyes: EOM are normal. Pupils are equal, round, and reactive to light.  Neck: Normal range of motion.  Cardiovascular: Normal rate and regular  rhythm.   Respiratory: Effort normal and breath sounds normal.  GI: Soft.  Musculoskeletal:   Her exam shows that she has some very mild weakness in right foot dorsiflexion, plantar flexion.  Her knee extension/flexion strength is normal.  Hip abduction/adduction strength is normal.    Neurological: She is alert and oriented to person, place, and time.  Skin: Skin is warm and dry.  Psychiatric: She has a normal mood and affect.     IMPRESSION:  Spinal stenosis L3-4 and L4-5 with spondylolisthesis L4-5  PLAN:Gil procedure at L4-5, TLIF at L4-5, pedicle screws and rods and local bone graft, central decompression would be carried up to the L3-4 level.    Keagan Brislin E 11/21/2013, 9:50 AM

## 2013-11-20 MED ORDER — CEFAZOLIN SODIUM-DEXTROSE 2-3 GM-% IV SOLR
2.0000 g | INTRAVENOUS | Status: DC
  Filled 2013-11-20: qty 50

## 2013-11-21 ENCOUNTER — Encounter (HOSPITAL_COMMUNITY): Payer: Self-pay | Admitting: Anesthesiology

## 2013-11-21 ENCOUNTER — Inpatient Hospital Stay (HOSPITAL_COMMUNITY): Payer: Medicare Other | Admitting: Anesthesiology

## 2013-11-21 ENCOUNTER — Encounter (HOSPITAL_COMMUNITY): Payer: Medicare Other | Admitting: Vascular Surgery

## 2013-11-21 ENCOUNTER — Inpatient Hospital Stay (HOSPITAL_COMMUNITY)
Admission: RE | Admit: 2013-11-21 | Discharge: 2013-11-23 | DRG: 460 | Disposition: A | Discharge status: Home / Self Care | Payer: Medicare Other | Source: Ambulatory Visit | Attending: Specialist | Admitting: Specialist

## 2013-11-21 ENCOUNTER — Inpatient Hospital Stay (HOSPITAL_COMMUNITY): Payer: Medicare Other

## 2013-11-21 ENCOUNTER — Encounter (HOSPITAL_COMMUNITY)
Admission: RE | Disposition: A | Discharge status: Home / Self Care | Payer: Medicare Other | Source: Ambulatory Visit | Attending: Specialist

## 2013-11-21 DIAGNOSIS — E039 Hypothyroidism, unspecified: Secondary | ICD-10-CM | POA: Diagnosis present

## 2013-11-21 DIAGNOSIS — Z882 Allergy status to sulfonamides status: Secondary | ICD-10-CM

## 2013-11-21 DIAGNOSIS — I10 Essential (primary) hypertension: Secondary | ICD-10-CM | POA: Diagnosis present

## 2013-11-21 DIAGNOSIS — Q762 Congenital spondylolisthesis: Secondary | ICD-10-CM

## 2013-11-21 DIAGNOSIS — I059 Rheumatic mitral valve disease, unspecified: Secondary | ICD-10-CM | POA: Diagnosis present

## 2013-11-21 DIAGNOSIS — K219 Gastro-esophageal reflux disease without esophagitis: Secondary | ICD-10-CM | POA: Diagnosis present

## 2013-11-21 DIAGNOSIS — M48061 Spinal stenosis, lumbar region without neurogenic claudication: Secondary | ICD-10-CM | POA: Diagnosis present

## 2013-11-21 DIAGNOSIS — M48062 Spinal stenosis, lumbar region with neurogenic claudication: Secondary | ICD-10-CM | POA: Diagnosis present

## 2013-11-21 DIAGNOSIS — H9190 Unspecified hearing loss, unspecified ear: Secondary | ICD-10-CM | POA: Diagnosis present

## 2013-11-21 DIAGNOSIS — D62 Acute posthemorrhagic anemia: Secondary | ICD-10-CM | POA: Diagnosis not present

## 2013-11-21 DIAGNOSIS — Z91038 Other insect allergy status: Secondary | ICD-10-CM

## 2013-11-21 DIAGNOSIS — I739 Peripheral vascular disease, unspecified: Secondary | ICD-10-CM | POA: Diagnosis present

## 2013-11-21 DIAGNOSIS — E875 Hyperkalemia: Secondary | ICD-10-CM | POA: Diagnosis not present

## 2013-11-21 DIAGNOSIS — I2510 Atherosclerotic heart disease of native coronary artery without angina pectoris: Secondary | ICD-10-CM | POA: Diagnosis present

## 2013-11-21 DIAGNOSIS — J4489 Other specified chronic obstructive pulmonary disease: Secondary | ICD-10-CM | POA: Diagnosis present

## 2013-11-21 DIAGNOSIS — J449 Chronic obstructive pulmonary disease, unspecified: Secondary | ICD-10-CM | POA: Diagnosis present

## 2013-11-21 DIAGNOSIS — N289 Disorder of kidney and ureter, unspecified: Secondary | ICD-10-CM | POA: Diagnosis not present

## 2013-11-21 DIAGNOSIS — M069 Rheumatoid arthritis, unspecified: Secondary | ICD-10-CM | POA: Diagnosis present

## 2013-11-21 DIAGNOSIS — Z87891 Personal history of nicotine dependence: Secondary | ICD-10-CM

## 2013-11-21 DIAGNOSIS — Z885 Allergy status to narcotic agent status: Secondary | ICD-10-CM

## 2013-11-21 DIAGNOSIS — M129 Arthropathy, unspecified: Secondary | ICD-10-CM | POA: Diagnosis present

## 2013-11-21 DIAGNOSIS — E782 Mixed hyperlipidemia: Secondary | ICD-10-CM | POA: Diagnosis present

## 2013-11-21 DIAGNOSIS — M549 Dorsalgia, unspecified: Secondary | ICD-10-CM | POA: Diagnosis present

## 2013-11-21 SURGERY — POSTERIOR LUMBAR FUSION 1 LEVEL
Anesthesia: General

## 2013-11-21 MED ORDER — PANTOPRAZOLE SODIUM 40 MG IV SOLR
40.0000 mg | Freq: Every day | INTRAVENOUS | Status: DC
  Administered 2013-11-21: 40 mg via INTRAVENOUS
  Filled 2013-11-21 (×2): qty 40

## 2013-11-21 MED ORDER — ONDANSETRON HCL 4 MG/2ML IJ SOLN
INTRAMUSCULAR | Status: DC | PRN
  Administered 2013-11-21: 4 mg via INTRAVENOUS

## 2013-11-21 MED ORDER — GLYCOPYRROLATE 0.2 MG/ML IJ SOLN
INTRAMUSCULAR | Status: AC
  Filled 2013-11-21: qty 2

## 2013-11-21 MED ORDER — LEVOTHYROXINE SODIUM 25 MCG PO TABS
25.0000 ug | ORAL_TABLET | Freq: Every day | ORAL | Status: DC
  Administered 2013-11-22 – 2013-11-23 (×2): 25 ug via ORAL
  Filled 2013-11-21 (×4): qty 1

## 2013-11-21 MED ORDER — ARTIFICIAL TEARS OP OINT
TOPICAL_OINTMENT | OPHTHALMIC | Status: AC
  Filled 2013-11-21: qty 3.5

## 2013-11-21 MED ORDER — HYDROCODONE-ACETAMINOPHEN 5-325 MG PO TABS
1.0000 | ORAL_TABLET | ORAL | Status: DC | PRN
  Administered 2013-11-21 (×2): 1 via ORAL
  Administered 2013-11-22 – 2013-11-23 (×4): 2 via ORAL
  Filled 2013-11-21: qty 2
  Filled 2013-11-21: qty 1
  Filled 2013-11-21 (×3): qty 2
  Filled 2013-11-21: qty 1

## 2013-11-21 MED ORDER — SODIUM CHLORIDE 0.9 % IJ SOLN: 3.0000 mL | INTRAMUSCULAR | Status: DC | PRN

## 2013-11-21 MED ORDER — PROTAMINE SULFATE 10 MG/ML IV SOLN
INTRAVENOUS | Status: AC
  Filled 2013-11-21: qty 5

## 2013-11-21 MED ORDER — ACETAMINOPHEN 325 MG PO TABS: 650.0000 mg | ORAL_TABLET | ORAL | Status: DC | PRN

## 2013-11-21 MED ORDER — FENTANYL CITRATE 0.05 MG/ML IJ SOLN
INTRAMUSCULAR | Status: DC | PRN
  Administered 2013-11-21 (×4): 50 ug via INTRAVENOUS

## 2013-11-21 MED ORDER — HYDROCHLOROTHIAZIDE 12.5 MG PO TABS: 3.1250 mg | ORAL_TABLET | Freq: Every day | ORAL | Status: DC | PRN

## 2013-11-21 MED ORDER — PHENYLEPHRINE HCL 10 MG/ML IJ SOLN
INTRAMUSCULAR | Status: DC | PRN
  Administered 2013-11-21 (×5): 80 ug via INTRAVENOUS

## 2013-11-21 MED ORDER — ASPIRIN 325 MG PO TABS
325.0000 mg | ORAL_TABLET | Freq: Every day | ORAL | Status: DC
  Administered 2013-11-21 – 2013-11-23 (×3): 325 mg via ORAL
  Filled 2013-11-21 (×4): qty 1

## 2013-11-21 MED ORDER — 0.9 % SODIUM CHLORIDE (POUR BTL) OPTIME
TOPICAL | Status: DC | PRN
  Administered 2013-11-21: 1000 mL

## 2013-11-21 MED ORDER — CALCIUM CHLORIDE 10 % IV SOLN
INTRAVENOUS | Status: AC
  Filled 2013-11-21: qty 10

## 2013-11-21 MED ORDER — ONDANSETRON HCL 4 MG/2ML IJ SOLN
INTRAMUSCULAR | Status: AC
Start: 2013-11-21 — End: 2013-11-21
  Filled 2013-11-21: qty 2

## 2013-11-21 MED ORDER — ROCURONIUM BROMIDE 100 MG/10ML IV SOLN
INTRAVENOUS | Status: DC | PRN
  Administered 2013-11-21: 50 mg via INTRAVENOUS

## 2013-11-21 MED ORDER — ALBUMIN HUMAN 5 % IV SOLN
INTRAVENOUS | Status: DC | PRN
  Administered 2013-11-21: 12:00:00 via INTRAVENOUS

## 2013-11-21 MED ORDER — ZOLPIDEM TARTRATE 5 MG PO TABS: 5.0000 mg | ORAL_TABLET | Freq: Every evening | ORAL | Status: DC | PRN

## 2013-11-21 MED ORDER — BUPIVACAINE LIPOSOME 1.3 % IJ SUSP
20.0000 mL | INTRAMUSCULAR | Status: DC
  Filled 2013-11-21: qty 20

## 2013-11-21 MED ORDER — BUDESONIDE-FORMOTEROL FUMARATE 80-4.5 MCG/ACT IN AERO
2.0000 | INHALATION_SPRAY | Freq: Two times a day (BID) | RESPIRATORY_TRACT | Status: DC
  Administered 2013-11-21: 2 via RESPIRATORY_TRACT
  Filled 2013-11-21: qty 6.9

## 2013-11-21 MED ORDER — AZELASTINE HCL 0.1 % NA SOLN
1.0000 | Freq: Two times a day (BID) | NASAL | Status: DC
  Administered 2013-11-21 – 2013-11-22 (×3): 1 via NASAL
  Filled 2013-11-21 (×2): qty 30

## 2013-11-21 MED ORDER — LIDOCAINE HCL (CARDIAC) 20 MG/ML IV SOLN
INTRAVENOUS | Status: AC
  Filled 2013-11-21: qty 5

## 2013-11-21 MED ORDER — LORATADINE 10 MG PO TABS
10.0000 mg | ORAL_TABLET | Freq: Every day | ORAL | Status: DC
  Administered 2013-11-21 – 2013-11-23 (×3): 10 mg via ORAL
  Filled 2013-11-21 (×4): qty 1

## 2013-11-21 MED ORDER — LACTATED RINGERS IV SOLN
INTRAVENOUS | Status: DC | PRN
  Administered 2013-11-21 (×2): via INTRAVENOUS

## 2013-11-21 MED ORDER — EPHEDRINE SULFATE 50 MG/ML IJ SOLN
INTRAMUSCULAR | Status: DC | PRN
  Administered 2013-11-21 (×2): 10 mg via INTRAVENOUS

## 2013-11-21 MED ORDER — VECURONIUM BROMIDE 10 MG IV SOLR
INTRAVENOUS | Status: DC | PRN
  Administered 2013-11-21 (×3): 2 mg via INTRAVENOUS

## 2013-11-21 MED ORDER — ONDANSETRON HCL 4 MG/2ML IJ SOLN: 4.0000 mg | INTRAMUSCULAR | Status: DC | PRN

## 2013-11-21 MED ORDER — MORPHINE SULFATE 2 MG/ML IJ SOLN: 1.0000 mg | INTRAMUSCULAR | Status: DC | PRN

## 2013-11-21 MED ORDER — HYDROMORPHONE HCL PF 1 MG/ML IJ SOLN
0.2500 mg | INTRAMUSCULAR | Status: DC | PRN
  Administered 2013-11-21 (×2): 0.5 mg via INTRAVENOUS

## 2013-11-21 MED ORDER — BUPIVACAINE HCL 0.5 % IJ SOLN
INTRAMUSCULAR | Status: DC | PRN
  Administered 2013-11-21: 20 mL

## 2013-11-21 MED ORDER — BUPIVACAINE-EPINEPHRINE (PF) 0.5% -1:200000 IJ SOLN
INTRAMUSCULAR | Status: AC
  Filled 2013-11-21: qty 30

## 2013-11-21 MED ORDER — ONDANSETRON HCL 4 MG/2ML IJ SOLN: 4.0000 mg | Freq: Once | INTRAMUSCULAR | Status: DC | PRN

## 2013-11-21 MED ORDER — MENTHOL 3 MG MT LOZG: 1.0000 | LOZENGE | OROMUCOSAL | Status: DC | PRN

## 2013-11-21 MED ORDER — OXYCODONE-ACETAMINOPHEN 5-325 MG PO TABS
1.0000 | ORAL_TABLET | ORAL | Status: DC | PRN
  Administered 2013-11-22: 2 via ORAL
  Filled 2013-11-21: qty 2

## 2013-11-21 MED ORDER — THROMBIN 20000 UNITS EX KIT
PACK | CUTANEOUS | Status: AC
  Filled 2013-11-21: qty 1

## 2013-11-21 MED ORDER — PROPOFOL 10 MG/ML IV BOLUS
INTRAVENOUS | Status: DC | PRN
Start: 2013-11-21 — End: 2013-11-21
  Administered 2013-11-21: 120 mg via INTRAVENOUS
  Administered 2013-11-21: 30 mg via INTRAVENOUS

## 2013-11-21 MED ORDER — FENTANYL CITRATE 0.05 MG/ML IJ SOLN
INTRAMUSCULAR | Status: AC
  Filled 2013-11-21: qty 5

## 2013-11-21 MED ORDER — METHOCARBAMOL 500 MG PO TABS
500.0000 mg | ORAL_TABLET | Freq: Four times a day (QID) | ORAL | Status: DC | PRN
  Administered 2013-11-21 – 2013-11-22 (×3): 500 mg via ORAL
  Filled 2013-11-21 (×3): qty 1

## 2013-11-21 MED ORDER — PHENYLEPHRINE HCL 10 MG/ML IJ SOLN
10.0000 mg | INTRAVENOUS | Status: DC | PRN
  Administered 2013-11-21: 20 ug/min via INTRAVENOUS

## 2013-11-21 MED ORDER — BISACODYL 10 MG RE SUPP: 10.0000 mg | Freq: Every day | RECTAL | Status: DC | PRN

## 2013-11-21 MED ORDER — NEOSTIGMINE METHYLSULFATE 10 MG/10ML IV SOLN
INTRAVENOUS | Status: AC
  Filled 2013-11-21: qty 1

## 2013-11-21 MED ORDER — BUPIVACAINE HCL (PF) 0.5 % IJ SOLN
INTRAMUSCULAR | Status: AC
  Filled 2013-11-21: qty 10

## 2013-11-21 MED ORDER — LACTATED RINGERS IV SOLN: INTRAVENOUS | Status: DC

## 2013-11-21 MED ORDER — SODIUM CHLORIDE 0.9 % IV SOLN: 250.0000 mL | INTRAVENOUS | Status: DC

## 2013-11-21 MED ORDER — SURGIFOAM 100 EX MISC
CUTANEOUS | Status: DC | PRN
  Administered 2013-11-21: 1 via TOPICAL

## 2013-11-21 MED ORDER — PROPOFOL 10 MG/ML IV BOLUS
INTRAVENOUS | Status: AC
  Filled 2013-11-21: qty 20

## 2013-11-21 MED ORDER — NEOSTIGMINE METHYLSULFATE 10 MG/10ML IV SOLN
INTRAVENOUS | Status: DC | PRN
  Administered 2013-11-21: 3 mg via INTRAVENOUS

## 2013-11-21 MED ORDER — SODIUM CHLORIDE 0.9 % IJ SOLN
3.0000 mL | Freq: Two times a day (BID) | INTRAMUSCULAR | Status: DC
  Administered 2013-11-21 – 2013-11-23 (×4): 3 mL via INTRAVENOUS

## 2013-11-21 MED ORDER — BUPIVACAINE LIPOSOME 1.3 % IJ SUSP
INTRAMUSCULAR | Status: DC | PRN
  Administered 2013-11-21: 20 mL

## 2013-11-21 MED ORDER — KCL IN DEXTROSE-NACL 20-5-0.45 MEQ/L-%-% IV SOLN
INTRAVENOUS | Status: AC
  Filled 2013-11-21: qty 1000

## 2013-11-21 MED ORDER — PHENOL 1.4 % MT LIQD: 1.0000 | OROMUCOSAL | Status: DC | PRN

## 2013-11-21 MED ORDER — METHOCARBAMOL 1000 MG/10ML IJ SOLN
500.0000 mg | Freq: Four times a day (QID) | INTRAVENOUS | Status: DC | PRN
  Filled 2013-11-21: qty 5

## 2013-11-21 MED ORDER — KETOROLAC TROMETHAMINE 30 MG/ML IJ SOLN
INTRAMUSCULAR | Status: AC
  Filled 2013-11-21: qty 1

## 2013-11-21 MED ORDER — AMLODIPINE BESYLATE 5 MG PO TABS
5.0000 mg | ORAL_TABLET | Freq: Every day | ORAL | Status: DC
  Administered 2013-11-22 – 2013-11-23 (×2): 5 mg via ORAL
  Filled 2013-11-21 (×2): qty 1

## 2013-11-21 MED ORDER — KETOROLAC TROMETHAMINE 30 MG/ML IJ SOLN
30.0000 mg | Freq: Once | INTRAMUSCULAR | Status: AC
  Administered 2013-11-21: 30 mg via INTRAVENOUS

## 2013-11-21 MED ORDER — KCL IN DEXTROSE-NACL 20-5-0.45 MEQ/L-%-% IV SOLN
INTRAVENOUS | Status: DC
  Administered 2013-11-21: 18:00:00 via INTRAVENOUS
  Filled 2013-11-21 (×3): qty 1000

## 2013-11-21 MED ORDER — ALPRAZOLAM 0.5 MG PO TABS
0.5000 mg | ORAL_TABLET | Freq: Two times a day (BID) | ORAL | Status: DC | PRN
  Administered 2013-11-21 – 2013-11-22 (×2): 0.5 mg via ORAL
  Filled 2013-11-21 (×2): qty 1

## 2013-11-21 MED ORDER — ALBUTEROL SULFATE (2.5 MG/3ML) 0.083% IN NEBU
3.0000 mL | INHALATION_SOLUTION | Freq: Four times a day (QID) | RESPIRATORY_TRACT | Status: DC | PRN
Start: 2013-11-21 — End: 2013-11-23

## 2013-11-21 MED ORDER — VITAMIN D3 25 MCG (1000 UNIT) PO TABS
1000.0000 [IU] | ORAL_TABLET | Freq: Every day | ORAL | Status: DC
  Administered 2013-11-21 – 2013-11-23 (×3): 1000 [IU] via ORAL
  Filled 2013-11-21 (×4): qty 1

## 2013-11-21 MED ORDER — METHOCARBAMOL 500 MG PO TABS
ORAL_TABLET | ORAL | Status: AC
  Filled 2013-11-21: qty 1

## 2013-11-21 MED ORDER — CALCIUM CHLORIDE 10 % IV SOLN
INTRAVENOUS | Status: DC | PRN
  Administered 2013-11-21 (×2): 100 mg via INTRAVENOUS

## 2013-11-21 MED ORDER — CEFAZOLIN SODIUM 1-5 GM-% IV SOLN
1.0000 g | Freq: Three times a day (TID) | INTRAVENOUS | Status: AC
  Administered 2013-11-21 (×2): 1 g via INTRAVENOUS
  Filled 2013-11-21 (×2): qty 50

## 2013-11-21 MED ORDER — BISACODYL 5 MG PO TBEC: 5.0000 mg | DELAYED_RELEASE_TABLET | Freq: Every day | ORAL | Status: DC | PRN

## 2013-11-21 MED ORDER — ARTIFICIAL TEARS OP OINT
TOPICAL_OINTMENT | OPHTHALMIC | Status: DC | PRN
  Administered 2013-11-21: 1 via OPHTHALMIC

## 2013-11-21 MED ORDER — THROMBIN 20000 UNITS EX SOLR
CUTANEOUS | Status: AC
  Filled 2013-11-21: qty 20000

## 2013-11-21 MED ORDER — HYDROMORPHONE HCL PF 1 MG/ML IJ SOLN
INTRAMUSCULAR | Status: AC
  Filled 2013-11-21: qty 1

## 2013-11-21 MED ORDER — POLYETHYLENE GLYCOL 3350 17 G PO PACK
17.0000 g | PACK | Freq: Every day | ORAL | Status: DC
  Administered 2013-11-21 – 2013-11-23 (×3): 17 g via ORAL
  Filled 2013-11-21 (×4): qty 1

## 2013-11-21 MED ORDER — KETOROLAC TROMETHAMINE 15 MG/ML IJ SOLN
INTRAMUSCULAR | Status: AC
  Filled 2013-11-21: qty 1

## 2013-11-21 MED ORDER — HEMOSTATIC AGENTS (NO CHARGE) OPTIME
TOPICAL | Status: DC | PRN
  Administered 2013-11-21: 1 via TOPICAL

## 2013-11-21 MED ORDER — LIDOCAINE HCL (CARDIAC) 20 MG/ML IV SOLN
INTRAVENOUS | Status: DC | PRN
Start: 2013-11-21 — End: 2013-11-21
  Administered 2013-11-21: 100 mg via INTRAVENOUS

## 2013-11-21 MED ORDER — GLYCOPYRROLATE 0.2 MG/ML IJ SOLN
INTRAMUSCULAR | Status: DC | PRN
  Administered 2013-11-21: 0.4 mg via INTRAVENOUS

## 2013-11-21 MED ORDER — ACETAMINOPHEN 650 MG RE SUPP: 650.0000 mg | RECTAL | Status: DC | PRN

## 2013-11-21 MED ORDER — FLEET ENEMA 7-19 GM/118ML RE ENEM: 1.0000 | ENEMA | Freq: Once | RECTAL | Status: AC | PRN

## 2013-11-21 SURGICAL SUPPLY — 67 items
BLADE SURG ROTATE 9660 (MISCELLANEOUS) IMPLANT
BUR ROUND FLUTED 4 SOFT TCH (BURR) ×2 IMPLANT
BUR ROUND FLUTED 4MM SOFT TCH (BURR) ×1
CAGE CONCORDE BULLET 9X7X23 (Cage) ×2 IMPLANT
CAGE CONCORDE BULLET 9X7X23MM (Cage) ×1 IMPLANT
CORDS BIPOLAR (ELECTRODE) ×3 IMPLANT
COVER MAYO STAND STRL (DRAPES) ×6 IMPLANT
COVER SURGICAL LIGHT HANDLE (MISCELLANEOUS) ×3 IMPLANT
DERMABOND ADVANCED (GAUZE/BANDAGES/DRESSINGS) ×2
DERMABOND ADVANCED .7 DNX12 (GAUZE/BANDAGES/DRESSINGS) ×1 IMPLANT
DRAPE C-ARM 42X72 X-RAY (DRAPES) ×3 IMPLANT
DRAPE C-ARMOR (DRAPES) ×6 IMPLANT
DRAPE SURG 17X23 STRL (DRAPES) ×9 IMPLANT
DRAPE TABLE COVER HEAVY DUTY (DRAPES) ×3 IMPLANT
DRSG MEPILEX BORDER 4X4 (GAUZE/BANDAGES/DRESSINGS) IMPLANT
DRSG MEPILEX BORDER 4X8 (GAUZE/BANDAGES/DRESSINGS) ×3 IMPLANT
DURAPREP 26ML APPLICATOR (WOUND CARE) ×3 IMPLANT
ELECT BLADE 6.5 EXT (BLADE) IMPLANT
ELECT CAUTERY BLADE 6.4 (BLADE) ×3 IMPLANT
ELECT REM PT RETURN 9FT ADLT (ELECTROSURGICAL) ×3
ELECTRODE REM PT RTRN 9FT ADLT (ELECTROSURGICAL) ×1 IMPLANT
EVACUATOR 1/8 PVC DRAIN (DRAIN) IMPLANT
GLOVE BIOGEL PI IND STRL 7.5 (GLOVE) ×3 IMPLANT
GLOVE BIOGEL PI INDICATOR 7.5 (GLOVE) ×6
GLOVE ECLIPSE 7.0 STRL STRAW (GLOVE) ×9 IMPLANT
GLOVE ECLIPSE 9.0 STRL (GLOVE) ×3 IMPLANT
GLOVE SURG 8.5 LATEX PF (GLOVE) ×12 IMPLANT
GOWN STRL REUS W/ TWL LRG LVL3 (GOWN DISPOSABLE) ×4 IMPLANT
GOWN STRL REUS W/TWL 2XL LVL3 (GOWN DISPOSABLE) ×6 IMPLANT
GOWN STRL REUS W/TWL LRG LVL3 (GOWN DISPOSABLE) ×8
HEMOSTAT SURGICEL 2X14 (HEMOSTASIS) IMPLANT
KIT BASIN OR (CUSTOM PROCEDURE TRAY) ×3 IMPLANT
KIT POSITION SURG JACKSON T1 (MISCELLANEOUS) ×3 IMPLANT
KIT ROOM TURNOVER OR (KITS) ×3 IMPLANT
NEEDLE 22X1 1/2 (OR ONLY) (NEEDLE) ×3 IMPLANT
NEEDLE ASP BONE MRW 11GX15 (NEEDLE) IMPLANT
NEEDLE BONE MARROW 8GAX6 (NEEDLE) IMPLANT
NEEDLE SPNL 18GX3.5 QUINCKE PK (NEEDLE) ×3 IMPLANT
NS IRRIG 1000ML POUR BTL (IV SOLUTION) ×3 IMPLANT
PACK LAMINECTOMY ORTHO (CUSTOM PROCEDURE TRAY) ×3 IMPLANT
PAD ARMBOARD 7.5X6 YLW CONV (MISCELLANEOUS) ×6 IMPLANT
PATTIES SURGICAL .75X.75 (GAUZE/BANDAGES/DRESSINGS) ×3 IMPLANT
PATTIES SURGICAL 1X1 (DISPOSABLE) ×3 IMPLANT
ROD EXEDIUM PREBENT 5.5 40MM (Rod) ×2 IMPLANT
ROD EXEDIUM PREBENT 5.5X40 (Rod) ×1 IMPLANT
ROD PRE BENT EXP 40MM (Rod) ×3 IMPLANT
SCREW SET SINGLE INNER (Screw) ×12 IMPLANT
SCREW VIPER CORT FIX 5.00X35 (Screw) IMPLANT
SCREW VIPER CORT FIX 5.00X40 (Screw) ×9 IMPLANT
SCREW VIPER CORTICAL FIX 6X40 (Screw) ×3 IMPLANT
SPONGE GAUZE 4X4 12PLY (GAUZE/BANDAGES/DRESSINGS) ×3 IMPLANT
SURGIFLO W/THROMBIN 8M KIT (HEMOSTASIS) ×3 IMPLANT
SUT VIC AB 0 CT1 27 (SUTURE) ×2
SUT VIC AB 0 CT1 27XBRD ANBCTR (SUTURE) ×1 IMPLANT
SUT VIC AB 1 CTX 36 (SUTURE) ×4
SUT VIC AB 1 CTX36XBRD ANBCTR (SUTURE) ×2 IMPLANT
SUT VIC AB 2-0 CT1 27 (SUTURE) ×2
SUT VIC AB 2-0 CT1 TAPERPNT 27 (SUTURE) ×1 IMPLANT
SUT VICRYL 0 CT 1 36IN (SUTURE) ×6 IMPLANT
SUT VICRYL 4-0 PS2 18IN ABS (SUTURE) ×6 IMPLANT
SYR 20CC LL (SYRINGE) ×3 IMPLANT
SYR CONTROL 10ML LL (SYRINGE) ×6 IMPLANT
TOWEL OR 17X24 6PK STRL BLUE (TOWEL DISPOSABLE) ×3 IMPLANT
TOWEL OR 17X26 10 PK STRL BLUE (TOWEL DISPOSABLE) ×3 IMPLANT
TRAY FOLEY CATH 16FRSI W/METER (SET/KITS/TRAYS/PACK) ×3 IMPLANT
WATER STERILE IRR 1000ML POUR (IV SOLUTION) ×3 IMPLANT
YANKAUER SUCT BULB TIP NO VENT (SUCTIONS) ×3 IMPLANT

## 2013-11-21 NOTE — Op Note (Signed)
11/21/2013  1:43 PM  PATIENT:  Amber Yang  80 y.o. female  MRN: [id number]  OPERATIVE REPORT  PRE-OPERATIVE DIAGNOSIS:  L4-5 spondylolisthesis and severe spinal stenosis, L3-4 mild stenosis  POST-OPERATIVE DIAGNOSIS:  same  PROCEDURE:  Procedure(s): Gill Procedure L4-5, Transforaminal lumbar interbody fusion L4-5 with pedicle screws and rods, local bone graft, Central decompression L3-4    SURGEON:  Jessy Oto, MD     ASSISTANT:  Phillips Hay, PA-C  (Present throughout the entire procedure and necessary for completion of procedure in a timely manner)     ANESTHESIA:  General,supplemented with local marcaine 1/2% 1:1 exparel 1.3% total 40cc. Dr. Sherren Kerns.    COMPLICATIONS:  None.   EBL: 200cc    COMPONENTS:Viper cortical fixation screws L4: left 71mx40mm, right 628m40mm. L5: left 40m41m0mm, right 40mm58mmm. Left 340mm29m and Right 40mm 36m Concorde bullet cage 9mmx7m40m3mm.   PROCEDURE: The patient was met in the holding area, and the appropriate lumbar levels left L4-5 and L3-4 identified and marked with an "X" and my initials.The fusion level identified as L4-5. Patient understands the rationale to perform TLIFs at L4-5 and decompress the L4-5 and L3-4 central and foramenal stenosis and to allow stablilization of the L4-5 spondylolisthesis. The patient was then transported to OR and was placed under general anestheticwithout difficulty. The patient received appropriate preoperative antibiotic prophylaxis 2 gm Ancef.  Nursing staff inserted a Foley catheter under sterile conditions. The patient was then turned to a prone position using the JacksonEwingframe. PAS. all pressure points well padded the arms at the side to 90 90. Standard prep with DuraPrep solution draped in the usual manner from the lower dorsal spine the mid sacral segment. Iodine Vi-Drape was used and the old incision scar was marked. Time-out procedure was called and correct. Skin in the midline between L3  and S1 was then infiltrated with local anesthesia, marcaine 1/2% 1:1 exparel 1.3% total 20 cc used. Incision was then made  extending from L2-S1  through the skin and subcutaneous layers down to the patient's lumbodorsal fascia and spinous processes. The incision then carried sharply excising the supraspinous ligament and then continuing the lateral aspect of the spinous processes of L3, L4, L5 . Cobb elevator used to carefully elevate the paralumbar muscles off of the posterior elements using electrocautery carefully drilled bleeding and perform dissection of the muscle tissues of the preserving the facet capsule at the L3-4. Continuing the exposure out laterally to expose the lateral margin of the facet joint line at L3-4 and L4-5. Incision was carried in the midline down to the S1 level area bleeders controlled using electrocautery monopolar electrocautery.  Clamps were then placed at the L3-4 level and also at the L4-5 interspinous process level observed on lateral radiograph the upper clamp was beneath the L2 spinous process. Spinous processes of L4 and L3 marked with  OR marking pen sterilely Viper self retaining retractor was used for the upper part of the incision and the cerebellar retractor distally. C-arm fluoroscopy was then brought into the field and using C-arm fluoroscopy then a hole made into the medial aspect of the pedicle of L4 observed in the pedicle using C arm at the 5 oclock position on the left L4 pedicle nerve probe initial entry was determined on fluoroscopy to be good position alignment so that a 4.0mm tap78ms passed to 30 mm within the left L4 pedicle to a depth of nearly 40 mm observed on C-arm fluoroscopy to be beyond  the midpoint of the lumbar vertebra and then position alignment within the left L4 pedicle this was then removed and the pedicle channel probed demonstrating patency no sign of rupture the cortex of the pedicle. Tapping with a 4 mm screw tap then 5 mm tap then 5.0 mm x  40 mm screw was saved on the left side at the L4 level. C-arm fluoroscopy was then brought into the field and using C-arm fluoroscopy then a hole made into the posterior medial aspect of the pedicle of right L4 observed in the pedicle using ball tipped nerve hook and hockey stick nerve probe initial entry was determined on fluoroscopy to be good position alignment so that 4.57m tap was then used to tap the right L4 pedicle to a depth of nearly 40 mm observed on C-arm fluoroscopy to be beyond the midpoint of the lumbar vertebra and then position alignment within the right L4 pedicle this was then removed and the pedicle channel probed demonstrating patency no sign of rupture the cortex of the pedicle. Tapping with a 5 mm screw tap then 5.0 mm x 40 mm screw was chosen to be placed later following decompression and TLIF. C-arm fluoroscopy was then brought into the field and using C-arm fluoroscopy then a hole made into the posterior and medial aspect of the left pedicle of L5 observed in the pedicle using ball tipped nerve hook and hockey stick nerve probe initial entry was determined on fluoroscopy to be good position alignment so that a 4.086mtap was then used to tap the left L5 pedicle to a depth of nearly 30 mm observed on C-arm fluoroscopy to be beyond the posterior one third of the lumbar vertebra and good position alignment within the left L5 pedicle this was then removed and the pedicle channel probed demonstrating patency no sign of broach of the pedicle cortex. Tapping with a 5 mm screw tap then 5.32m95m 40 mm screw was saved to be placed on the left side at the L5 level. The pedicle channel of L5 on the left probed demonstrating patency no sign of rupture the cortex of the pedicle. Viper screw for fixation of this level was measured as 5.0 mm x 40 mm screw so  was saved to be placed on the left side at the L5 level. C-arm fluoroscopy was then brought into the field and using C-arm fluoroscopy then a hole made  into the posterior and medial aspect of the right pedicle of L5 observed in the pedicle using ball tipped nerve hook and hockey stick nerve probe initial entry was determined on fluoroscopy to be good position alignment so that a 4.32mm66mp was then used to tap the left L5 pedicle to a depth of nearly 30 mm observed on C-arm fluoroscopy to be beyond the posterior one third of the lumbar vertebra and good position alignment within the right L5 pedicle this was then removed and the pedicle channel probed demonstrating patency no sign of rupture the cortex of the pedicle. Tapping with a 5 mm screw then 5.32mm 21m0 mm screw was placed on the right side at the L5 level. The pedicle channel of L5 on the right probed demonstrating patency no sign of rupture the cortex of the pedicle. Viper screw for fixation of this level was measured as 5.0 mm x 40 mm screw saved for insertion after decompression and TLIF.   Spinous processes of  L4 and inferior 50% of L3 were then resected down to the base the  lamina at each segment Leksell rongeur used to resect inferior aspect of the lamina on both sides at the L4 level and partially on the both sides at L3 The left medial 70% of the facet of L4-5 was resected in order to decompress the left side of the lumbar thecal sac at  L4-5, decompressing the left L4 and L5  neuroforamen. Osteotomes and 68m and 3872mkerrisons were used for this portion of the decompression. Similarly the right side decompression was carried out 50% facetectomies were perform on the right at L4-5 decompressing the right L4 and L5 neuroforamen. With the near complete resection of the medial left L4-5 facet eased of placement of the concorde cage at the L4-5 level, inferior portions of the lamina and pars were also resected first beginning with the Leksell rongeur and osteotomes and then resecting using 2 and 3 mm Kerrison. The L3-4 level had removal of the hypertrophic ligamentum flavum centrally and off the medial  L3-4 facets, decompressing the L3-4 level centrally and both Lateral recesses.  The facet capsules of L4-5 were resected. The L5 nerve root identified bilaterally and the medial aspect of the L5 pedicle. Superior articular process of L5 was then resected from the left side further decompressing the left L5 nerve and providing for exposure of the area just superior to the L5 pedicle for a placement of cage.A large amount of hypertrophic ligmentum flavum was found impressing on the left lateral recesses at L4-5 and narrowing the respective L4, and L5 neuroforamen. Loupe magnification and headlight were used during this portion procedure.    Attention then turned to placement of the transforaminal lumbar interbody fusion cages. Using a Penfield 4 the left lateral aspect of the thecal sac at the L4-5 disc space was carefully freed up The thecal sac could then easily be retracted in the posterior lateral aspect of the L4-5 disc was exposed 15 blade scalpel used to incise the posterolateral disc and an osteotome used to resect a small portion of bone off the superior aspect of the posterior superior vertebral body of L5 in order to ease the entry into the L4-5 disc space. A  72m93merrison rongeur was then able to be introduced in the disc space debrided it was quite narrow. 7 mm dilator shaver was used to dialate the L4-5 disc space on the left side using small ring curettes and the disc space was debrided a minimal degenerative disc present in the endplates debrided to bleeding endplate bone. Shavers were inserted to trial the intervertebral disc space. A 7 mm x 39m53mpuy bullet concord cage was carefully packed with morcellized bone graft and the been harvested from previous laminotomies.The cage was then inserted with the articulating insertion handle.  Additional bone graft was then packed into the intervertebral disc space. Bleeding controlled using bipolar electrocautery thrombin soaked gel cottonoids. Bleeding  controlled using bipolar electrocautery.  Observed on C-arm fluoroscopy to be in good position alignment. The cage at L4-5  were placed anteriorly as best as possible restore lordosis. With this then the transforaminal lumbar interbody fusion portion of the case was completed bleeders were controlled using bipolar electrocautery thrombin-soaked Gelfoam were appropriate.Decortication of the facet joints carried out bilateral L4-5. These were packed with cancellous local bone graft.  2 pedicle screws on the left were then placed at L4 and L5, the 2 viper corticofixation screws on the right were each placed and then each fastener carefully aligned  to allow for placement of rods. The right side  first quarter inch titanium rod was then carefully contoured using the french benders and a precontoured 18m rod. This was then placed into the pedicle screws on the right extending from L4-L5 each of the caps were carefully placed loosely tightened. Attention turned to the left side were similarly and then screws were carefully adjusted to allow for a better pattern screws to allow for placement of fixation of the rod a quarter inch 40 mm precontoured titanium rod was then carefully contoured. This was able to be inserted into the left pedicle screw fasteners, Caps onto the L4 fasteners were tightened to 80 foot lbs. Across the leftt side  L4-5  screw fasteners compression was obtained between L4 and L5,  by compressing between the fasteners and tightening the screw caps 85 pounds. Similarly this was done on the right side at L4-5 obtaining compression and tightened 85 pounds. Irrigation was carried out with copious amounts of saline solution this was done throughout the case. Cell Saver was used during the case.  Hockey stick neuroprobe was used to probe the neuroforamen bilateral L4 and L5 , these were determined to be well decompressed. Permanent C-arm images were obtained in AP and lateral plane and oblique planes.  Remaining local bone graft was then applied along both lateral posterior lateral region extending from L4 to L5 facet beds.Gelfoam was then removed spinal canal the lumbodorsal musculature carefully exam debrided of any devitalized tissue following removal of Viper retractors were the bleeders were controlled using electrocautery and the area dorsal lumbar muscle were then approximated in the midline with interrupted #1 Vicryl sutures loose the dorsal fascia was reattached to the spinous process of L3 and L2  superiorly and L5 inferiorly this was done with #1 Vicryl sutures. Subcutaneous layers then approximated using interrupted 0 Vicryl sutures and 2-0 Vicryl sutures. Skin was closed with a running subcutaneous stitch of 4-0 Vicryl Dermabond was applied then MedPlex bandage. All instrument and sponge counts were correct. The patient was then returned to a supine position on her bed reactivated extubated and returned to the recovery room in satisfactory condition.   SPhillips HayPA-C perform the duties of assistant surgeon during this case. She was present from the beginning of the case to the end of the case assisting in transfer the patient from his stretcher to the OR table and back to the stretcher at the end of the case. Assisted in careful retraction and suction of the laminectomy site delicate neural structures operating under the operating room microscope. She performed closure of the incision from the fascia to the skin applying the dressing.         NITKA,JAMES E  11/21/2013, 1:43 PM

## 2013-11-21 NOTE — Anesthesia Procedure Notes (Signed)
Procedure Name: Intubation Date/Time: 11/21/2013 10:10 AM Performed by: Jerilee Hoh Pre-anesthesia Checklist: Patient identified, Emergency Drugs available, Suction available and Patient being monitored Patient Re-evaluated:Patient Re-evaluated prior to inductionOxygen Delivery Method: Circle system utilized Preoxygenation: Pre-oxygenation with 100% oxygen Intubation Type: IV induction Ventilation: Mask ventilation without difficulty Laryngoscope Size: Mac and 3 Grade View: Grade I Tube type: Oral Tube size: 7.5 mm Number of attempts: 1 Airway Equipment and Method: Stylet Placement Confirmation: ETT inserted through vocal cords under direct vision,  positive ETCO2 and breath sounds checked- equal and bilateral Secured at: 21 cm Tube secured with: Tape Dental Injury: Teeth and Oropharynx as per pre-operative assessment

## 2013-11-21 NOTE — Anesthesia Postprocedure Evaluation (Signed)
  Anesthesia Post-op Note  Patient: Amber Yang  Procedure(s) Performed: Procedure(s): Gill Procedure L4-5, Transforaminal lumbar interbody fusion L4-5 with pedicle screws and rods, local bone graft, Central decompression L3-4 (N/A)  Patient Location: PACU  Anesthesia Type:General  Level of Consciousness: awake, oriented, sedated and patient cooperative  Airway and Oxygen Therapy: Patient Spontanous Breathing  Post-op Pain: moderate  Post-op Assessment: Post-op Vital signs reviewed, Patient's Cardiovascular Status Stable, Respiratory Function Stable, Patent Airway and No signs of Nausea or vomiting  Post-op Vital Signs: stable  Last Vitals:  Filed Vitals:   11/21/13 1513  BP:   Pulse: 71  Temp:   Resp: 16    Complications: No apparent anesthesia complications

## 2013-11-21 NOTE — Brief Op Note (Addendum)
11/21/2013  1:39 PM  PATIENT:  Amber Yang  80 y.o. female  PRE-OPERATIVE DIAGNOSIS:  L4-5 spondylolisthesis and severe spinal stenosis, L3-4 mild stenosis  POST-OPERATIVE DIAGNOSIS:  same  PROCEDURE:  Procedure(s): Gill Procedure L4-5, Transforaminal lumbar interbody fusion L4-5 with pedicle screws and rods, local bone graft, Central decompression L3-4 (N/A)  SURGEON:  Surgeon(s) and Role:     Kerrin Champagne, MD - Primary  PHYSICIAN ASSISTANT: Maud Deed, PA-C   ANESTHESIA:   local and general, Dr. Diamantina Monks.  EBL:  Total I/O In: 1250 [I.V.:1000; IV Piggyback:250] Out: 410 [Urine:210; Blood:200]  BLOOD ADMINISTERED:none  DRAINS: Urinary Catheter (Foley)   LOCAL MEDICATIONS USED:  MARCAINE1/2% 1:1 EXPAREL 1.3% Amount:40 ml   SPECIMEN:  No Specimen  DISPOSITION OF SPECIMEN:  N/A  COUNTS:  YES  TOURNIQUET:  * No tourniquets in log *  DICTATION: .Dragon Dictation  PLAN OF CARE: Admit to inpatient   PATIENT DISPOSITION:  PACU - hemodynamically stable.   Delay start of Pharmacological VTE agent (>24hrs) due to surgical blood loss or risk of bleeding: yes

## 2013-11-21 NOTE — Interval H&P Note (Signed)
History and Physical Interval Note:  11/21/2013 9:51 AM  Amber Yang  has presented today for surgery, with the diagnosis of L4-5 spondylolisthesis and severe spinal stenosis, L3-4 mild stenosis  The various methods of treatment have been discussed with the patient and family. After consideration of risks, benefits and other options for treatment, the patient has consented to  Procedure(s): Gill Procedure L4-5, Transforaminal lumbar interbody fusion L4-5 with pedicle screws and rods, local bone graft, Central decompression L3-4 (N/A) as a surgical intervention .  The patient's history has been reviewed, patient examined, no change in status, stable for surgery.  I have reviewed the patient's chart and labs.  Questions were answered to the patient's satisfaction.     NITKA,JAMES E

## 2013-11-21 NOTE — Transfer of Care (Signed)
Immediate Anesthesia Transfer of Care Note  Patient: Amber Yang  Procedure(s) Performed: Procedure(s): Gill Procedure L4-5, Transforaminal lumbar interbody fusion L4-5 with pedicle screws and rods, local bone graft, Central decompression L3-4 (N/A)  Patient Location: PACU  Anesthesia Type:General  Level of Consciousness: awake, alert , oriented and patient cooperative  Airway & Oxygen Therapy: Patient Spontanous Breathing and Patient connected to nasal cannula oxygen  Post-op Assessment: Report given to PACU RN, Post -op Vital signs reviewed and stable and Patient moving all extremities  Post vital signs: Reviewed and stable  Complications: No apparent anesthesia complications

## 2013-11-21 NOTE — Anesthesia Preprocedure Evaluation (Signed)
Anesthesia Evaluation  Patient identified by MRN, date of birth, ID band Patient awake    Reviewed: Allergy & Precautions, H&P , NPO status , Patient's Chart, lab work & pertinent test results  Airway       Dental   Pulmonary asthma , COPDformer smoker,          Cardiovascular hypertension, + CAD and + Peripheral Vascular Disease     Neuro/Psych    GI/Hepatic GERD-  ,  Endo/Other  Hypothyroidism   Renal/GU      Musculoskeletal  (+) Arthritis -,   Abdominal   Peds  Hematology   Anesthesia Other Findings   Reproductive/Obstetrics                           Anesthesia Physical Anesthesia Plan  ASA: III  Anesthesia Plan: General   Post-op Pain Management:    Induction: Intravenous  Airway Management Planned: Oral ETT  Additional Equipment:   Intra-op Plan:   Post-operative Plan: Extubation in OR  Informed Consent: I have reviewed the patients History and Physical, chart, labs and discussed the procedure including the risks, benefits and alternatives for the proposed anesthesia with the patient or authorized representative who has indicated his/her understanding and acceptance.     Plan Discussed with: CRNA, Anesthesiologist and Surgeon  Anesthesia Plan Comments:         Anesthesia Quick Evaluation

## 2013-11-22 LAB — BASIC METABOLIC PANEL
Anion gap: 15 (ref 5–15)
BUN: 13 mg/dL (ref 6–23)
CALCIUM: 8 mg/dL — AB (ref 8.4–10.5)
CO2: 24 mEq/L (ref 19–32)
CREATININE: 1.04 mg/dL (ref 0.50–1.10)
Chloride: 100 mEq/L (ref 96–112)
GFR calc non Af Amer: 49 mL/min — ABNORMAL LOW (ref >90)
GFR, EST AFRICAN AMERICAN: 57 mL/min — AB (ref >90)
GLUCOSE: 120 mg/dL — AB (ref 70–99)
POTASSIUM: 5.4 meq/L — AB (ref 3.7–5.3)
Sodium: 139 mEq/L (ref 137–147)

## 2013-11-22 LAB — CBC
HEMATOCRIT: 31.8 % — AB (ref 36.0–46.0)
HEMOGLOBIN: 10.5 g/dL — AB (ref 12.0–15.0)
MCH: 32.4 pg (ref 26.0–34.0)
MCHC: 33 g/dL (ref 30.0–36.0)
MCV: 98.1 fL (ref 78.0–100.0)
Platelets: 189 10*3/uL (ref 150–400)
RBC: 3.24 MIL/uL — ABNORMAL LOW (ref 3.87–5.11)
RDW: 13.7 % (ref 11.5–15.5)
WBC: 7.4 10*3/uL (ref 4.0–10.5)

## 2013-11-22 MED ORDER — PANTOPRAZOLE SODIUM 40 MG PO TBEC
40.0000 mg | DELAYED_RELEASE_TABLET | Freq: Every day | ORAL | Status: DC
  Administered 2013-11-22: 40 mg via ORAL

## 2013-11-22 MED ORDER — DEXTROSE-NACL 5-0.45 % IV SOLN
INTRAVENOUS | Status: DC
  Administered 2013-11-22: 09:00:00 via INTRAVENOUS

## 2013-11-22 MED FILL — Heparin Sodium (Porcine) Inj 1000 Unit/ML: INTRAMUSCULAR | Qty: 30 | Status: AC

## 2013-11-22 MED FILL — Thrombin For Soln 20000 Unit: CUTANEOUS | Qty: 1 | Status: AC

## 2013-11-22 MED FILL — Sodium Chloride IV Soln 0.9%: INTRAVENOUS | Qty: 1000 | Status: AC

## 2013-11-22 NOTE — Evaluation (Signed)
Occupational Therapy Evaluation Patient Details Name: Amber Yang MRN: [id number] DOB: 1930-07-29 Today's Date: 11/22/2013    History of Present Illness 80 y.o. female s/p Gill Procedure L4-5, Transforaminal lumbar interbody fusion L4-5 with pedicle screws and rods, local bone graft, Central decompression L3-4. Hx of CAD, HTN, COPD, rheumatoid arthritis, GERD, astham, and anxiety.   Clinical Impression   Pt is a pleasant, determined 80 year old woman who was independent prior to admission, living independently.  Pt is performing ADL and ADL tranfers at a supervision level with RW.  She is able to cross her foot over the opposite knee for LB bathing and dressing.  Educated extensively in back and safety precautions related to ADL and IADL.  Daughter in law will be staying with pt and she has extended family that can assist her prn.  Daughter in law will purchase a reacher, toilet tongs, and a long handled sponge in the community.  Educated in safe footwear.  No further OT needs.   Follow Up Recommendations  No OT follow up;Supervision/Assistance - 24 hour    Equipment Recommendations  None recommended by OT (has 3 in 1 at home)    Recommendations for Other Services       Precautions / Restrictions Precautions Precautions: Back Precaution Booklet Issued: Yes (comment) Precaution Comments: Reviewed back precautions related to ADL and IADL. Required Braces or Orthoses: Spinal Brace Spinal Brace: Lumbar corset;Applied in sitting position Restrictions Weight Bearing Restrictions: No      Mobility Bed Mobility               General bed mobility comments: NT, pt up in chair  Transfers Overall transfer level: Needs assistance Equipment used: Rolling walker (2 wheeled) Transfers: Sit to/from Stand Sit to Stand: Supervision         General transfer comment: verba cues for technique    Balance                                            ADL Overall ADL's  : Needs assistance/impaired Eating/Feeding: Independent;Sitting   Grooming: Supervision/safety;Standing;Wash/dry hands   Upper Body Bathing: Supervision/ safety;Sitting   Lower Body Bathing: Supervison/ safety;Sit to/from stand   Upper Body Dressing : Supervision/safety;Sitting   Lower Body Dressing: Supervision/safety;Sit to/from stand   Toilet Transfer: Min guard;Ambulation;Comfort height toilet;Grab bars;RW   Toileting- Clothing Manipulation and Hygiene: Sit to/from stand;Minimal assistance Toileting - Clothing Manipulation Details (indicate cue type and reason): assist to hold up gowns, pt able to perform pericare after urinating      Functional mobility during ADLs: Rolling walker;Supervision/safety       Vision                     Perception     Praxis      Pertinent Vitals/Pain Did not c/o pain, VSS     Hand Dominance Right   Extremity/Trunk Assessment Upper Extremity Assessment Upper Extremity Assessment: Overall WFL for tasks assessed   Lower Extremity Assessment Lower Extremity Assessment: Defer to PT evaluation   Cervical / Trunk Assessment Cervical / Trunk Assessment: Normal   Communication Communication Communication: No difficulties   Cognition Arousal/Alertness: Awake/alert Behavior During Therapy: WFL for tasks assessed/performed Overall Cognitive Status: Within Functional Limits for tasks assessed  General Comments       Exercises       Shoulder Instructions      Home Living Family/patient expects to be discharged to:: Private residence Living Arrangements: Alone Available Help at Discharge: Family;Available 24 hours/day Type of Home: Mobile home Home Access: Stairs to enter Entrance Stairs-Number of Steps: 4 Entrance Stairs-Rails: Right;Can reach both;Left Home Layout: One level     Bathroom Shower/Tub: Producer, television/film/video: Standard     Home Equipment: Bedside commode           Prior Functioning/Environment Level of Independence: Independent             OT Diagnosis:     OT Problem List:     OT Treatment/Interventions:      OT Goals(Current goals can be found in the care plan section) Acute Rehab OT Goals Patient Stated Goal: home with assist of family  OT Frequency:     Barriers to D/C:            Co-evaluation              End of Session Equipment Utilized During Treatment: Back brace;Rolling walker Nurse Communication: Other (comment) (pt urinated)  Activity Tolerance: Patient tolerated treatment well Patient left: in chair;with call bell/phone within reach;with family/visitor present   Time: 1325-1400 OT Time Calculation (min): 35 min Charges:  OT General Charges $OT Visit: 1 Procedure OT Evaluation $Initial OT Evaluation Tier I: 1 Procedure OT Treatments $Self Care/Home Management : 8-22 mins G-Codes:    [name] 11/22/2013, 2:01 PM [phone]

## 2013-11-22 NOTE — Progress Notes (Signed)
Patient c/o difficulty sleeping in hospital. States she wishes to be placed in a diaper for the night to not have to ambulate to void. Patient explained we do not stock adult depends and the possibility of skin breakdown if she is left in a wet diaper for a period of time. Patient given xanax 0.5 prn med and ensure drink to facilitates rest and relaxation.

## 2013-11-22 NOTE — Progress Notes (Signed)
Physical Therapy Treatment Patient Details Name: Amber Yang MRN: [id number] DOB: 01-Oct-1930 Today's Date: 11/22/2013    History of Present Illness 80 y.o. female s/p Gill Procedure L4-5, Transforaminal lumbar interbody fusion L4-5 with pedicle screws and rods, local bone graft, Central decompression L3-4. Hx of CAD, HTN, COPD, rheumatoid arthritis, GERD, astham, and anxiety.    PT Comments    Patient evaluated by Physical Therapy with no further acute PT needs identified. All education has been completed and the patient has no further questions. She is mobilizing at a supervision level and will have 24 hour care at home upon d/c from her daughter-in-law, who was present and actively participated in this therapy session. See below for any follow-up Physial Therapy or equipment needs. PT is signing off. Thank you for this referral.   Follow Up Recommendations  No PT follow up;Supervision for mobility/OOB     Equipment Recommendations  Rolling walker with 5" wheels;3in1 (PT)    Recommendations for Other Services       Precautions / Restrictions Precautions Precautions: Back Precaution Booklet Issued: Yes (comment) Precaution Comments: Reviewed back precautions related to ADL and IADL. Required Braces or Orthoses: Spinal Brace Spinal Brace: Lumbar corset;Applied in sitting position Restrictions Weight Bearing Restrictions: No    Mobility  Bed Mobility Overal bed mobility: Needs Assistance Bed Mobility: Rolling;Sidelying to Sit Rolling: Supervision Sidelying to sit: Supervision       General bed mobility comments: NT, pt up in chair  Transfers Overall transfer level: Needs assistance Equipment used: Rolling walker (2 wheeled) Transfers: Sit to/from Stand Sit to Stand: Supervision         General transfer comment: verba cues for technique  Ambulation/Gait Ambulation/Gait assistance: Supervision Ambulation Distance (Feet): 100 Feet (x2) Assistive device: Rolling  walker (2 wheeled);1 person hand held assist Gait Pattern/deviations: Step-through pattern;Decreased stride length;Narrow base of support;Trunk flexed   Gait velocity interpretation: Below normal speed for age/gender General Gait Details: Educated on safe DME use with rolling walker. Mild instability without assistive device but much improved with use of rolling walker. No instances of knees buckling (complaint prior to surgery). Demonstrates good control of walker after cued for placement.   Stairs Stairs: Yes Stairs assistance: Supervision Stair Management: Two rails;Step to pattern;Forwards Number of Stairs: 2 General stair comments: Demonstrated and instructed on safe stair navigation prior to having pt perform. Was able to complete with supervision for safety. Slow and guarded but performs safely. Able to correctly teach back sequencing.  Wheelchair Mobility    Modified Rankin (Stroke Patients Only)       Balance Overall balance assessment: Needs assistance Sitting-balance support: No upper extremity supported;Feet supported Sitting balance-Leahy Scale: Good     Standing balance support: No upper extremity supported Standing balance-Leahy Scale: Fair                      Cognition Arousal/Alertness: Awake/alert Behavior During Therapy: WFL for tasks assessed/performed Overall Cognitive Status: Within Functional Limits for tasks assessed                      Exercises      General Comments General comments (skin integrity, edema, etc.): Reviewed back precautions and how to safely don/doff spinal corset. Pt daughter-in-law was present and activly participated in all activities.      Pertinent Vitals/Pain Pt reports pain as 2/10 Patient repositioned in chair for comfort.     Home Living Family/patient expects to be discharged to::  Private residence Living Arrangements: Alone Available Help at Discharge: Family;Available 24 hours/day Type of Home:  Mobile home Home Access: Stairs to enter Entrance Stairs-Rails: Right;Can reach both;Left Home Layout: One level Home Equipment: Bedside commode      Prior Function Level of Independence: Independent          PT Goals (current goals can now be found in the care plan section) Acute Rehab PT Goals Patient Stated Goal: home with assist of family PT Goal Formulation: No goals set, d/c therapy    Frequency       PT Plan      Co-evaluation             End of Session Equipment Utilized During Treatment: Back brace Activity Tolerance: Patient tolerated treatment well Patient left: in chair;with call bell/phone within reach;with family/visitor present     Time: 1478-2956 PT Time Calculation (min): 33 min  Charges:  $Gait Training: 8-22 mins $Therapeutic Activity: 8-22 mins                    G Codes:      [employer], [place] [phone]  Berton Mount 11/22/2013, 2:33 PM

## 2013-11-22 NOTE — Progress Notes (Signed)
Subjective: 1 Day Post-Op Procedure(s) (LRB): Gill Procedure L4-5, Transforaminal lumbar interbody fusion L4-5 with pedicle screws and rods, local bone graft, Central decompression L3-4 (N/A) Patient reports pain as mild.   Concerned about not getting her inhaler and has used hers from home this am. Anxious to get out of bed.  Legs feel good.  Some tingling on left leg earlier but resolved currently No nausea.  Ate breakfast. Discussed post op xrays with pt as she had questions.  Objective: Vital signs in last 24 hours: Temp:  [97 F (36.1 C)-98.5 F (36.9 C)] 97.7 F (36.5 C) (07/29 0559) Pulse Rate:  [59-86] 78 (07/29 0559) Resp:  [14-20] 16 (07/29 0559) BP: (114-155)/(46-108) 129/55 mmHg (07/29 0559) SpO2:  [96 %-100 %] 100 % (07/29 0559)  Intake/Output from previous day: 07/28 0701 - 07/29 0700 In: 2500 [P.O.:300; I.V.:1950; IV Piggyback:250] Out: 990 [Urine:790; Blood:200] Intake/Output this shift:     Recent Labs  11/22/13 0525  HGB 10.5*    Recent Labs  11/22/13 0525  WBC 7.4  RBC 3.24*  HCT 31.8*  PLT 189    Recent Labs  11/22/13 0525  NA 139  K 5.4*  CL 100  CO2 24  BUN 13  CREATININE 1.04  GLUCOSE 120*  CALCIUM 8.0*   No results found for this basename: LABPT, INR,  in the last 72 hours  ABD soft Neurovascular intact Sensation intact distally Intact pulses distally Dorsiflexion/Plantar flexion intact Incision: no drainage  Assessment/Plan: 1 Day Post-Op Procedure(s) (LRB): Gill Procedure L4-5, Transforaminal lumbar interbody fusion L4-5 with pedicle screws and rods, local bone graft, Central decompression L3-4 (N/A) Advance diet Up with therapy Change IVF to remove potassium and renal diet started. OOB with brace.  Anticipate discharge Friday depending on progress with PT Terrisa Curfman M 11/22/2013, 8:24 AM

## 2013-11-23 DIAGNOSIS — E875 Hyperkalemia: Secondary | ICD-10-CM | POA: Diagnosis not present

## 2013-11-23 DIAGNOSIS — D62 Acute posthemorrhagic anemia: Secondary | ICD-10-CM | POA: Diagnosis not present

## 2013-11-23 LAB — HEMOGLOBIN AND HEMATOCRIT, BLOOD
HEMATOCRIT: 30 % — AB (ref 36.0–46.0)
Hemoglobin: 9.8 g/dL — ABNORMAL LOW (ref 12.0–15.0)

## 2013-11-23 LAB — BASIC METABOLIC PANEL
Anion gap: 11 (ref 5–15)
BUN: 12 mg/dL (ref 6–23)
CHLORIDE: 101 meq/L (ref 96–112)
CO2: 25 meq/L (ref 19–32)
Calcium: 8.2 mg/dL — ABNORMAL LOW (ref 8.4–10.5)
Creatinine, Ser: 0.88 mg/dL (ref 0.50–1.10)
GFR calc Af Amer: 70 mL/min — ABNORMAL LOW (ref >90)
GFR calc non Af Amer: 60 mL/min — ABNORMAL LOW (ref >90)
GLUCOSE: 148 mg/dL — AB (ref 70–99)
POTASSIUM: 5.1 meq/L (ref 3.7–5.3)
Sodium: 137 mEq/L (ref 137–147)

## 2013-11-23 MED ORDER — OXYCODONE-ACETAMINOPHEN 5-325 MG PO TABS: 1.0000 | ORAL_TABLET | Freq: Four times a day (QID) | ORAL | Status: DC | PRN

## 2013-11-23 MED ORDER — ASPIRIN 325 MG PO TABS: 325.0000 mg | ORAL_TABLET | Freq: Every day | ORAL | Status: AC

## 2013-11-23 MED ORDER — BISACODYL 5 MG PO TBEC: 5.0000 mg | DELAYED_RELEASE_TABLET | Freq: Every day | ORAL | Status: DC | PRN

## 2013-11-23 MED ORDER — METHOCARBAMOL 500 MG PO TABS: 500.0000 mg | ORAL_TABLET | Freq: Three times a day (TID) | ORAL | Status: DC | PRN

## 2013-11-23 NOTE — Progress Notes (Signed)
Patient examined and lab reviewed with Vernon PA-C. 

## 2013-11-23 NOTE — Progress Notes (Addendum)
Patient ID: Amber Yang, female   DOB: Dec 15, 1930, 80 y.o.   MRN: [id number] Subjective: 2 Days Post-Op Procedure(s) (LRB): Gill Procedure L4-5, Transforaminal lumbar interbody fusion L4-5 with pedicle screws and rods, local bone graft, Central decompression L3-4 (N/A) Awake, alert and oriented x 4. No leg pain. Low grade temp 99.3. Patient reports pain as mild.    Objective:   VITALS:  Temp:  [97.3 F (36.3 C)-99.8 F (37.7 C)] 99.2 F (37.3 C) (07/30 0542) Pulse Rate:  [71-86] 84 (07/30 0542) Resp:  [16] 16 (07/30 0542) BP: (101-128)/(51-76) 105/58 mmHg (07/30 0542) SpO2:  [96 %-98 %] 98 % (07/30 0542)  Neurologically intact ABD soft Neurovascular intact Sensation intact distally Intact pulses distally Incision: no drainage   LABS  Recent Labs  11/22/13 0525  HGB 10.5*  WBC 7.4  PLT 189    Recent Labs  11/22/13 0525  NA 139  K 5.4*  CL 100  CO2 24  BUN 13  CREATININE 1.04  GLUCOSE 120*   No results found for this basename: LABPT, INR,  in the last 72 hours   Assessment/Plan: 2 Days Post-Op Procedure(s) (LRB): Gill Procedure L4-5, Transforaminal lumbar interbody fusion L4-5 with pedicle screws and rods, local bone graft, Central decompression L3-4 (N/A) Boderline elevated Potassium 5.4. Dietary and IVF potassium intake discontinued. Perioperative acute blood loss anemia, mild   Check hgb and hct and bmet. Advance diet Up with therapy Discharge home with home health if lab is normalized.  NITKA,JAMES E 11/23/2013, 8:09 AM

## 2013-11-23 NOTE — Discharge Instructions (Signed)
Call if there is increasing drainage, fever greater than 101.5, severe head aches, and worsening nausea or light sensitivity. If shortness of breath, bloody cough or chest tightness or pain go to an emergency room. No lifting greater than 10 lbs. Avoid bending, stooping and twisting. Use brace when sitting and out of bed even to go to bathroom. Walk in house for first 2 weeks then may start to get out slowly increasing distances up to one mile by 4-6 weeks post op. After 5 days may shower and change dressing following bathing with shower.When bathing remove the brace shower and replace brace before getting out of the shower. If drainage, keep dry dressing and do not bathe the incision, use an moisture impervious dressing. Please call and return for scheduled follow up appointment 2 weeks from the time of surgery.   Anterior and Posterior Decompressions with Fusions Decompression is a procedure performed to relieve symptoms caused by pressure or compression on the spinal cord and nerve roots. The spinal cord is a cord of nervous tissue. It extends from the brain and passes through the spinal canal (passage formed by the openings in the backbone). Nerves branch out from the spinal cord to different parts of the body. Vertebrae (backbones) are a group of individual bones. They form the spinal column. Each vertebra is made up of lamina (bony arches of the spinal canal), spine, and foramen (opening in the backbone). A disc is a soft, gel-like cushion between each vertebra.  Decompression can be carried out as an anterior or posterior procedure. Anterior decompression is where the surgeon makes an incision more toward the front of your body in order to get access to the area needing repair. Posterior decompression is where the surgeon makes an incision more toward the back of your body in order to get access to the area needing repair. Depending upon the details of your specific condition, there are  reasons why the surgeon may choose one approach over the other. CAUSES Compression of spinal cord and nerves can be caused by:  Bulging or collapse of the spinal disc.  Loosening of the ligaments.  Bony growth. The causes of the spinal cord compression include:  Degenerative diseases (such as with many arthritis problems).  Rupture of the disc.  Traumatic injury.  Spinal stenosis (narrowing of the spinal canal).  Pott disease (tuberculosis of the spine). LET YOUR CAREGIVER KNOW ABOUT:   Bleeding and clotting problems.  Allergy to anesthesia medications.  Allergy to medications like steroid and blood thinners.  Previous surgeries.  Bone diseases like disease of low bone mass (osteoporosis) and infection of bone (osteomyelitis).  Cancerous (neoplastic) conditions. RISKS AND COMPLICATIONS  Bleeding and collection of blood outside the blood vessels.  Damage to the blood vessels. This can cause heavy bleeding and even death.  Damage to the nerves. This can cause pain, loss of sensation, paralysis, and weakness.  Damage to the covering of the spinal cord (meninges). This can cause the cerebrospinal fluid to leak.  Complications involving graft and plate.  Inadequate fusion.  Wound infection. BEFORE THE PROCEDURE  Your caregiver will make sure that you have been given a reasonable trial of nonsurgical treatment methods. Your caregiver may perform an X-ray study with a dye (diskogram). The injection of a dye into a disc may produce a pain similar to your back pain. This will help your caregiver to identify the disc that is the source of pain.  PROCEDURE Your surgeon will perform spinal decompression and fusion  while you are under general anesthesia. There are different methods of performing decompression surgery. They include the following:  Diskectomy. Your surgeon will remove a portion of a spinal disc that is causing pressure on the nerve roots.  Laminotomy or  laminectomy. Lamina refers to bony arches of the spinal canal. Laminotomy is a procedure in which your surgeon removes a small portion of lamina. Laminectomy is the removal of an entire lamina.  Foraminotomy or foraminectomy. Foramen refers to the opening in the backbone for nerve roots. Foraminotomy is the removal of a small amount of bone and tissue forming the foramen. Foraminectomy is the removal of a large amount of bone and tissue.  Osteophyte removal. Your surgeon removes bony growths called osteophytes. These cause pressure on the spinal column.  Corpectomy. Corpus refers to the body of vertebra. This procedure involves the removal of the body of a vertebra and also the discs.  Spinal fusion is required if there is a curvature of the spine or forward slip of a vertebra. Spinal fusion is a procedure of fusing two or more vertebrae together with a bone graft (new bone or substitute material from your body). This is further strengthened by using screws, plate, and cage apparatus.  Fusion prevents motion between 2 vertebrae. It also prevents the curvature of the spine and slip of vertebra from getting worse after surgery. AFTER THE PROCEDURE   You will be moved to the recovery room and then to the hospital room.  You will have a thin tube (catheter) inserted into the urinary bladder to help in urination.  Your may have pain for the first few days after the surgery. Your caregiver will give you medications to control pain.  Your caregiver may order blood tests to monitor oxygen carrying protein of the red blood cells (hemoglobin). This would be due to blood loss during the surgery. Hemoglobin should be monitored to make sure that the level of blood oxygen does not become too low.  You will be given a back brace. A back brace limits motion and helps fusion of the bone.  You may continue to have mild pain even after full recovery.  You may have a series X-ray examinations over time. This  will ensure adequate healing and appropriate alignment at the site of operation. HOME CARE INSTRUCTIONS   To get strength and function, start physical therapy, occupational therapy, and exercises.  To ease the pain, you may have to exercise regularly. That also helps in weight loss.  To keep your spine in proper alignment, you should sit, stand, walk, turn in bed, and reposition yourself as instructed.  At first, take only short walks. Slowly increase other activities.  Avoid smoking. Nicotine inhibits fusion of the bone.  If narcotics (pain medication) are prescribed, you should not drink alcohol. You should not drive when you are on this medication because you will feel drowsy.  Avoid use of pain medication products and nonsteroidal anti-inflammatory agents (pain medication). They interfere with the development and growth of new bone cells.  Your caregiver may recommend using ice to manage pain. Ask your caregiver for instructions on how to do it.  Avoid lifting heavy objects.  Avoid bending and twisting motions. SEEK MEDICAL CARE IF:   You have increased pain.  There is expanding redness at the operative site.  You have a fever. SEEK IMMEDIATE MEDICAL CARE IF:   You have any discomfort with the substitute material that has been used during the spinal fusion procedure.  You feel a collection of blood outside the blood vessels.  You notice any changes in the smell, appearance, or amount of drainage. Document Released: 05/03/2007 Document Revised: 08/28/2013 Document Reviewed: 09/03/2007 [facility] Patient Information 2015 [place], [place]. This information is not intended to replace advice given to you by your health care provider. Make sure you discuss any questions you have with your health care provider.

## 2013-11-23 NOTE — Care Management Note (Signed)
CARE MANAGEMENT NOTE 11/23/2013  Patient:  Amber Yang, Amber Yang   Account Number:  [id number]  Date Initiated:  11/23/2013  Documentation initiated by:  Vance Peper  Subjective/Objective Assessment:   80 yr old female s/p L4-L5 fusion with decompression.     Action/Plan:   Patient has no home health needs, Case manager will arrange. Patient has family suppport at discharge.   Anticipated DC Date:  11/23/2013   Anticipated DC Plan:  HOME/SELF CARE      DC Planning Services  CM consult      PAC Choice  DURABLE MEDICAL EQUIPMENT   Choice offered to / List presented to:  C-1 Patient   DME arranged  Levan Hurst      DME agency  [facility]     [facility] arranged  NA      Status of service:  Completed, signed off Medicare Important Message given?   (If response is "NO", the following Medicare IM given date fields will be blank) Date Medicare IM given:   Medicare IM given by:   Date Additional Medicare IM given:   Additional Medicare IM given by:    Discharge Disposition:  HOME/SELF CARE  Per UR Regulation:  Reviewed for med. necessity/level of care/duration of stay

## 2013-11-23 NOTE — Progress Notes (Signed)
Patient ID: Amber Yang, female   DOB: Aug 27, 1930, 80 y.o.   MRN: [id number] Subjective: 2 Days Post-Op Procedure(s) (LRB): Gill Procedure L4-5, Transforaminal lumbar interbody fusion L4-5 with pedicle screws and rods, local bone graft, Central decompression L3-4 (N/A);No change in level of discomfort, mild. No complaints.   Patient reports pain as mild.    Objective:   VITALS:  Temp:  [97.3 F (36.3 C)-99.8 F (37.7 C)] 99.2 F (37.3 C) (07/30 0542) Pulse Rate:  [71-86] 84 (07/30 0542) Resp:  [16] 16 (07/30 0542) BP: (101-128)/(51-76) 105/58 mmHg (07/30 0542) SpO2:  [96 %-98 %] 98 % (07/30 0542)  Neurologically intact ABD soft No cellulitis present Compartment soft   LABS  Recent Labs  11/22/13 0525 11/23/13 0930  HGB 10.5* 9.8*  WBC 7.4  --   PLT 189  --     Recent Labs  11/22/13 0525 11/23/13 0930  NA 139 137  K 5.4* 5.1  CL 100 101  CO2 24 25  BUN 13 12  CREATININE 1.04 0.88  GLUCOSE 120* 148*   No results found for this basename: LABPT, INR,  in the last 72 hours   Assessment/Plan: 2 Days Post-Op Procedure(s) (LRB): Gill Procedure L4-5, Transforaminal lumbar interbody fusion L4-5 with pedicle screws and rods, local bone graft, Central decompression L3-4 (N/A) Borderline hyperkalemia likely due to mild renal insufficiency and IV and po intake. Acute blood loss anemia, stable mild.  Discharge home with home health Okay for discharge. Provide instructions on hyperkalemia, decrease intake. Lucy Boardman E 11/23/2013, 12:59 PM

## 2013-12-04 NOTE — Discharge Summary (Signed)
Physician Discharge Summary  Patient ID: ELZABETH MCQUERRY MRN: [id number] DOB/AGE: 10-09-1930 80 y.o.  Admit date: 11/21/2013 Discharge date: 11/23/2013  Admission Diagnoses:  Spinal stenosis, lumbar region, with neurogenic claudication L4-5 spondylolisthesis and severe spinal stenosis, L3-4 mild stenosis  Discharge Diagnoses:  Principal Problem:   Spinal stenosis, lumbar region, with neurogenic claudication Active Problems:   Postoperative anemia due to acute blood loss   Past Medical History  Diagnosis Date  . Coronary artery disease   . Hypertension   . COPD (chronic obstructive pulmonary disease)   . Asthma   . Hypothyroidism   . Rheumatoid arthritis   . History of colon polyps   . Peripheral vascular disease     followed by Dr Sondra Come  . DDD (degenerative disc disease)   . Constipation   . Anxiety   . Nocturnal leg cramps   . GERD (gastroesophageal reflux disease)   . Hyperlipemia, mixed   . H/O echocardiogram 01/27/2013  . History of nuclear stress test 12/20/2012  . Hearing loss   . COPD, severe   . Shortness of breath     exertional  . UTI (urinary tract infection) 05/2013  . Mitral valve regurgitation     Surgeries: Procedure(s): Gill Procedure L4-5, Transforaminal lumbar interbody fusion L4-5 with pedicle screws and rods, local bone graft, Central decompression L3-4 on 11/21/2013   Consultants (if any):    Discharged Condition: Improved  Hospital Course: JAHIRA SWISS is an 80 y.o. female who was admitted 11/21/2013 with a diagnosis of Spinal stenosis, lumbar region, with neurogenic claudication and went to the operating room on 11/21/2013 and underwent the above named procedures.    She was given perioperative antibiotics:  Anti-infectives   Start     Dose/Rate Route Frequency Ordered Stop   11/21/13 1700  ceFAZolin (ANCEF) IVPB 1 g/50 mL premix     1 g 100 mL/hr over 30 Minutes Intravenous Every 8 hours 11/21/13 1551 11/22/13 0019   11/21/13 0600  ceFAZolin  (ANCEF) IVPB 2 g/50 mL premix  Status:  Discontinued     2 g 100 mL/hr over 30 Minutes Intravenous On call to O.R. 11/20/13 1416 11/21/13 1534    Borderline hyperkalemia likely due to mild renal insufficiency resolved with dietary and IV changes. Good progression with PT and independent at discharge to be cared for at her daughters home.  She was given sequential compression devices, early ambulation for DVT prophylaxis.  She benefited maximally from the hospital stay and there were no complications.    Recent vital signs:  Filed Vitals:   11/23/13 1422  BP: 103/44  Pulse: 77  Temp: 98.2 F (36.8 C)  Resp:     Recent laboratory studies:  Lab Results  Component Value Date   HGB 9.8* 11/23/2013   HGB 10.5* 11/22/2013   HGB 14.9 11/14/2013   Lab Results  Component Value Date   WBC 7.4 11/22/2013   PLT 189 11/22/2013   Lab Results  Component Value Date   INR 0.92 09/10/2010   Lab Results  Component Value Date   NA 137 11/23/2013   K 5.1 11/23/2013   CL 101 11/23/2013   CO2 25 11/23/2013   BUN 12 11/23/2013   CREATININE 0.88 11/23/2013   GLUCOSE 148* 11/23/2013    Discharge Medications:     Medication List         albuterol 108 (90 BASE) MCG/ACT inhaler  Commonly known as:  PROVENTIL HFA;VENTOLIN HFA  Inhale 2 puffs into the  lungs every 6 (six) hours as needed for wheezing or shortness of breath.     ALPRAZolam 0.5 MG tablet  Commonly known as:  XANAX  Take 0.5 mg by mouth 2 (two) times daily as needed for anxiety.     amLODipine 5 MG tablet  Commonly known as:  NORVASC  Take 5 mg by mouth daily.     aspirin 325 MG tablet  Take 325 mg by mouth daily.     aspirin 325 MG tablet  Take 1 tablet (325 mg total) by mouth daily.     azelastine 0.1 % nasal spray  Commonly known as:  ASTELIN  Place 1 spray into both nostrils 2 (two) times daily. Use in each nostril as directed     bisacodyl 5 MG EC tablet  Commonly known as:  DULCOLAX  Take 5 mg by mouth daily as  needed for moderate constipation.     bisacodyl 5 MG EC tablet  Commonly known as:  DULCOLAX  Take 1 tablet (5 mg total) by mouth daily as needed for moderate constipation.     budesonide-formoterol 80-4.5 MCG/ACT inhaler  Commonly known as:  SYMBICORT  Inhale 2 puffs into the lungs 2 (two) times daily.     cholecalciferol 1000 UNITS tablet  Commonly known as:  VITAMIN D  Take 1,000 Units by mouth daily.     EX-LAX 15 MG Chew  Generic drug:  Sennosides  Chew 1 each by mouth at bedtime as needed. Constipation     flintstones complete 60 MG chewable tablet  Chew 1 tablet by mouth daily.     hydrochlorothiazide 12.5 MG tablet  Commonly known as:  HYDRODIURIL  Take 3.125 mg by mouth daily as needed. edema     HYDROcodone-acetaminophen 5-325 MG per tablet  Commonly known as:  NORCO/VICODIN  Take 1 tablet by mouth every 6 (six) hours as needed for moderate pain.     levothyroxine 25 MCG tablet  Commonly known as:  SYNTHROID, LEVOTHROID  Take 25 mcg by mouth daily before breakfast.     loratadine 10 MG tablet  Commonly known as:  CLARITIN  Take 10 mg by mouth daily.     Lutein 20 MG Caps  Take 20 mg by mouth daily.     Magnesium 250 MG Tabs  Take 250 mg by mouth daily.     methocarbamol 500 MG tablet  Commonly known as:  ROBAXIN  Take 1 tablet (500 mg total) by mouth every 8 (eight) hours as needed for muscle spasms.     oxyCODONE-acetaminophen 5-325 MG per tablet  Commonly known as:  PERCOCET/ROXICET  Take 1-2 tablets by mouth every 6 (six) hours as needed for moderate pain.     psyllium 58.6 % powder  Commonly known as:  METAMUCIL  Take 1 packet by mouth daily.        Diagnostic Studies: Dg Chest 2 View  11/14/2013   CLINICAL DATA:  80 year old female preoperative study for lumbar surgery. Hypertension, heart disease, former smoker. Initial encounter.  EXAM: CHEST  2 VIEW  COMPARISON:  09/10/2010.  FINDINGS: Cervical ACDF hardware now in place. Stable lung  volumes. Normal cardiac size and mediastinal contours. Stable mild basilar predominant increased interstitial markings. No pneumothorax, pulmonary edema, pleural effusion or acute pulmonary opacity. No acute osseous abnormality identified.  IMPRESSION: No acute cardiopulmonary abnormality.   Electronically Signed   By: Augusto Gamble M.D.   On: 11/14/2013 11:31   Dg Lumbar Spine Complete  11/21/2013  CLINICAL DATA:  L4-5 fusion.  EXAM: LUMBAR SPINE - COMPLETE 4+ VIEW; DG C-ARM 61-120 MIN  COMPARISON:  10/20/2013 MR.  Fluoroscopic time:  48 seconds.  FINDINGS: Four intraoperative C-arm views submitted for review after surgery.  Fusion L4-5 level with bilateral pedicle screws, posterior connecting bar and interbody spacer. Decrease in degree of anterior slippage of L4. No obvious complication.  Marked L5-S1 disc space narrowing.  IMPRESSION: Fusion L4-5 as noted above.   Electronically Signed   By: Bridgett Larsson M.D.   On: 11/21/2013 13:43   Dg C-arm 1-60 Min  11/21/2013   CLINICAL DATA:  L4-5 fusion.  EXAM: LUMBAR SPINE - COMPLETE 4+ VIEW; DG C-ARM 61-120 MIN  COMPARISON:  10/20/2013 MR.  Fluoroscopic time:  48 seconds.  FINDINGS: Four intraoperative C-arm views submitted for review after surgery.  Fusion L4-5 level with bilateral pedicle screws, posterior connecting bar and interbody spacer. Decrease in degree of anterior slippage of L4. No obvious complication.  Marked L5-S1 disc space narrowing.  IMPRESSION: Fusion L4-5 as noted above.   Electronically Signed   By: Bridgett Larsson M.D.   On: 11/21/2013 13:43    Disposition: 01-Home or Self Care      Discharge Instructions   Call MD / Call 911    Complete by:  As directed   If you experience chest pain or shortness of breath, CALL 911 and be transported to the hospital emergency room.  If you develope a fever above 101 F, pus (white drainage) or increased drainage or redness at the wound, or calf pain, call your surgeon's office.     Constipation  Prevention    Complete by:  As directed   Drink plenty of fluids.  Prune juice may be helpful.  You may use a stool softener, such as Colace (over the counter) 100 mg twice a day.  Use MiraLax (over the counter) for constipation as needed.     Diet general    Complete by:  As directed      Discharge instructions    Complete by:  As directed   Call if there is increasing drainage, fever greater than 101.5, severe head aches, and worsening nausea or light sensitivity. If shortness of breath, bloody cough or chest tightness or pain go to an emergency room. No lifting greater than 10 lbs. Avoid bending, stooping and twisting. Use brace when sitting and out of bed even to go to bathroom. Walk in house for first 2 weeks then may start to get out slowly increasing distances up to one mile by 4-6 weeks post op. After 5 days may shower and change dressing following bathing with shower.When bathing remove the brace shower and replace brace before getting out of the shower. If drainage, keep dry dressing and do not bathe the incision, use an moisture impervious dressing. Please call and return for scheduled follow up appointment 2 weeks from the time of surgery.     Driving restrictions    Complete by:  As directed   No driving for 6 weeks     Increase activity slowly as tolerated    Complete by:  As directed      Lifting restrictions    Complete by:  As directed   No lifting for 6 weeks           Follow-up Information   Follow up with NITKA,JAMES E, MD. Schedule an appointment as soon as possible for a visit in 2 weeks.   Specialty:  Orthopedic  Surgery   Contact information:   [address] [place] [address] [phone]        Signed: Wende Neighbors 12/04/2013, 4:18 PM

## 2013-12-05 NOTE — Discharge Summary (Signed)
Patient d/c summary note and lab reviewed.  

## 2014-10-05 DIAGNOSIS — M778 Other enthesopathies, not elsewhere classified: Secondary | ICD-10-CM | POA: Insufficient documentation

## 2014-10-05 DIAGNOSIS — M7580 Other shoulder lesions, unspecified shoulder: Secondary | ICD-10-CM

## 2014-10-17 ENCOUNTER — Other Ambulatory Visit (HOSPITAL_COMMUNITY): Payer: Self-pay | Admitting: Specialist

## 2014-10-17 DIAGNOSIS — M545 Low back pain: Secondary | ICD-10-CM

## 2014-10-26 ENCOUNTER — Ambulatory Visit (HOSPITAL_COMMUNITY)
Admission: RE | Admit: 2014-10-26 | Discharge: 2014-10-26 | Disposition: A | Discharge status: Home / Self Care | Payer: Medicare Other | Source: Ambulatory Visit | Attending: Specialist | Admitting: Specialist

## 2014-10-26 DIAGNOSIS — M545 Low back pain: Secondary | ICD-10-CM | POA: Diagnosis present

## 2014-10-26 DIAGNOSIS — M4806 Spinal stenosis, lumbar region: Secondary | ICD-10-CM | POA: Insufficient documentation

## 2014-10-26 DIAGNOSIS — M5126 Other intervertebral disc displacement, lumbar region: Secondary | ICD-10-CM | POA: Insufficient documentation

## 2014-10-26 LAB — CREATININE, SERUM
CREATININE: 1 mg/dL (ref 0.44–1.00)
GFR calc non Af Amer: 51 mL/min — ABNORMAL LOW (ref >60)
GFR, EST AFRICAN AMERICAN: 59 mL/min — AB (ref >60)

## 2014-10-26 MED ORDER — GADOBENATE DIMEGLUMINE 529 MG/ML IV SOLN
13.0000 mL | Freq: Once | INTRAVENOUS | Status: AC | PRN
  Administered 2014-10-26: 13 mL via INTRAVENOUS

## 2014-11-05 ENCOUNTER — Other Ambulatory Visit: Payer: Self-pay | Admitting: Specialist

## 2014-11-05 DIAGNOSIS — M48061 Spinal stenosis, lumbar region without neurogenic claudication: Secondary | ICD-10-CM

## 2014-11-12 ENCOUNTER — Ambulatory Visit
Admission: RE | Admit: 2014-11-12 | Discharge: 2014-11-12 | Disposition: A | Discharge status: Home / Self Care | Payer: Medicare Other | Source: Ambulatory Visit | Attending: Specialist | Admitting: Specialist

## 2014-11-12 DIAGNOSIS — M48061 Spinal stenosis, lumbar region without neurogenic claudication: Secondary | ICD-10-CM

## 2014-11-12 MED ORDER — IOHEXOL 180 MG/ML  SOLN
1.0000 mL | Freq: Once | INTRAMUSCULAR | Status: AC | PRN
  Administered 2014-11-12: 1 mL via INTRATHECAL

## 2014-11-12 MED ORDER — METHYLPREDNISOLONE ACETATE 40 MG/ML INJ SUSP (RADIOLOG
120.0000 mg | Freq: Once | INTRAMUSCULAR | Status: AC
  Administered 2014-11-12: 120 mg via EPIDURAL

## 2014-11-12 NOTE — Discharge Instructions (Signed)

## 2014-11-22 IMAGING — RF DG C-ARM 61-120 MIN
1 series · 4 of 4 positions shown · non-contrast
Comparison: 10/20/2013 MR.

Fluoroscopic time:  48 seconds.

CLINICAL DATA: L4-5 fusion.

EXAM:
LUMBAR SPINE - COMPLETE 4+ VIEW; DG C-ARM 61-120 MIN

[Series 1: run · 4 of 4 slices shown]
[im 1/4]
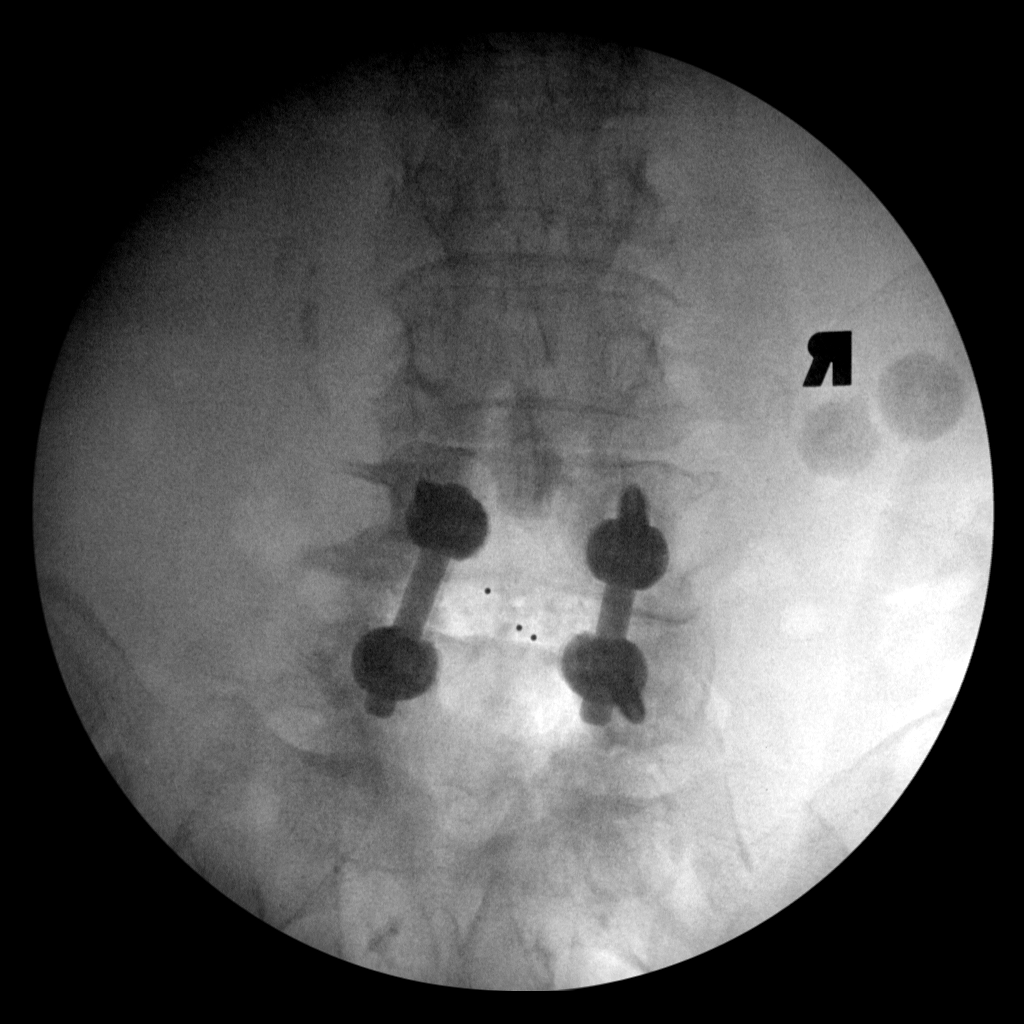
[im 2/4]
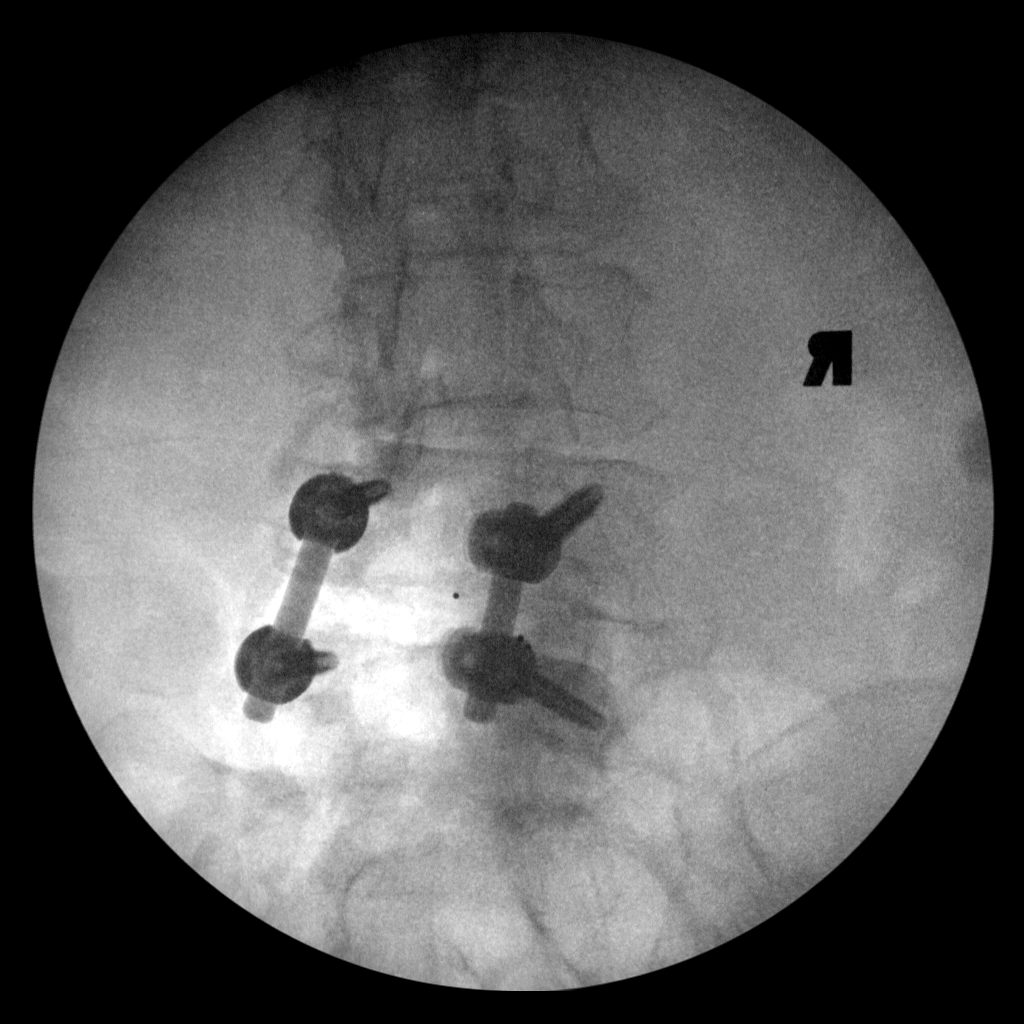
[im 3/4]
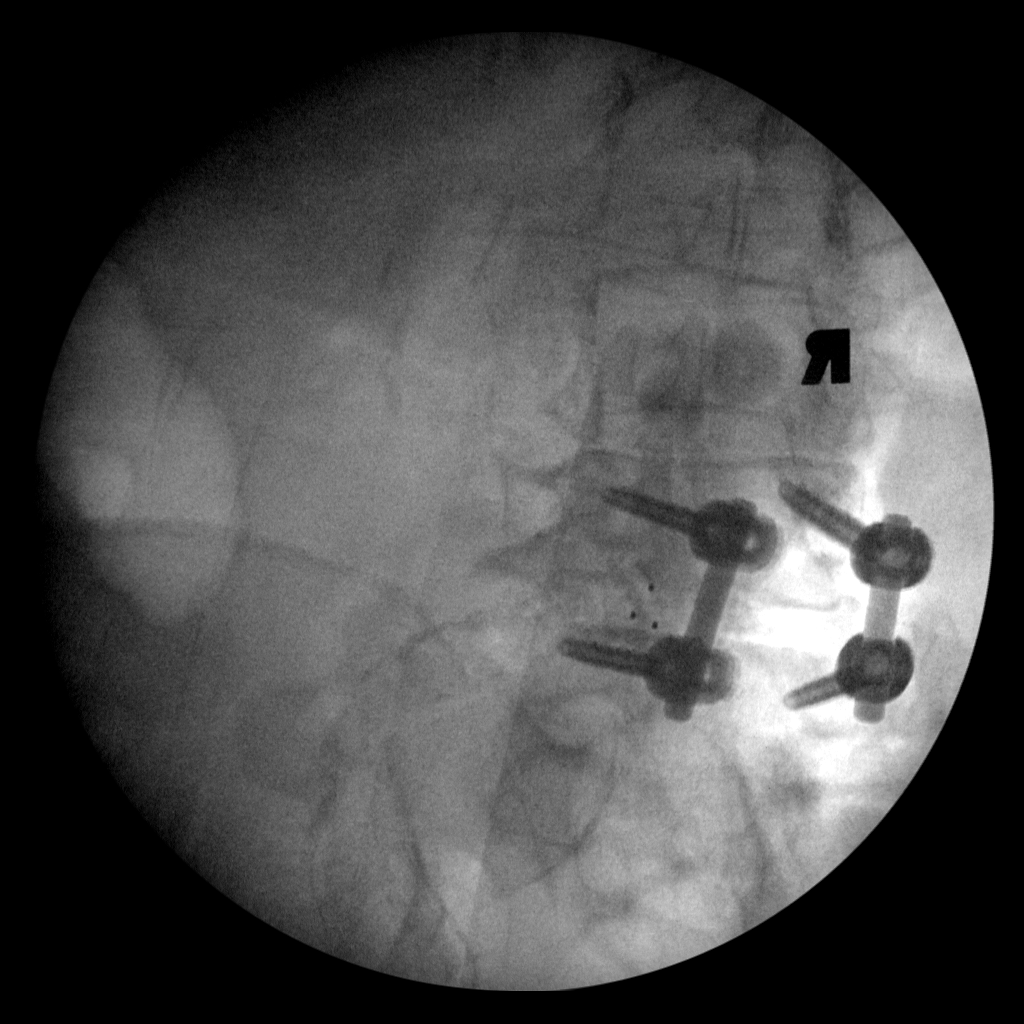
[im 4/4]
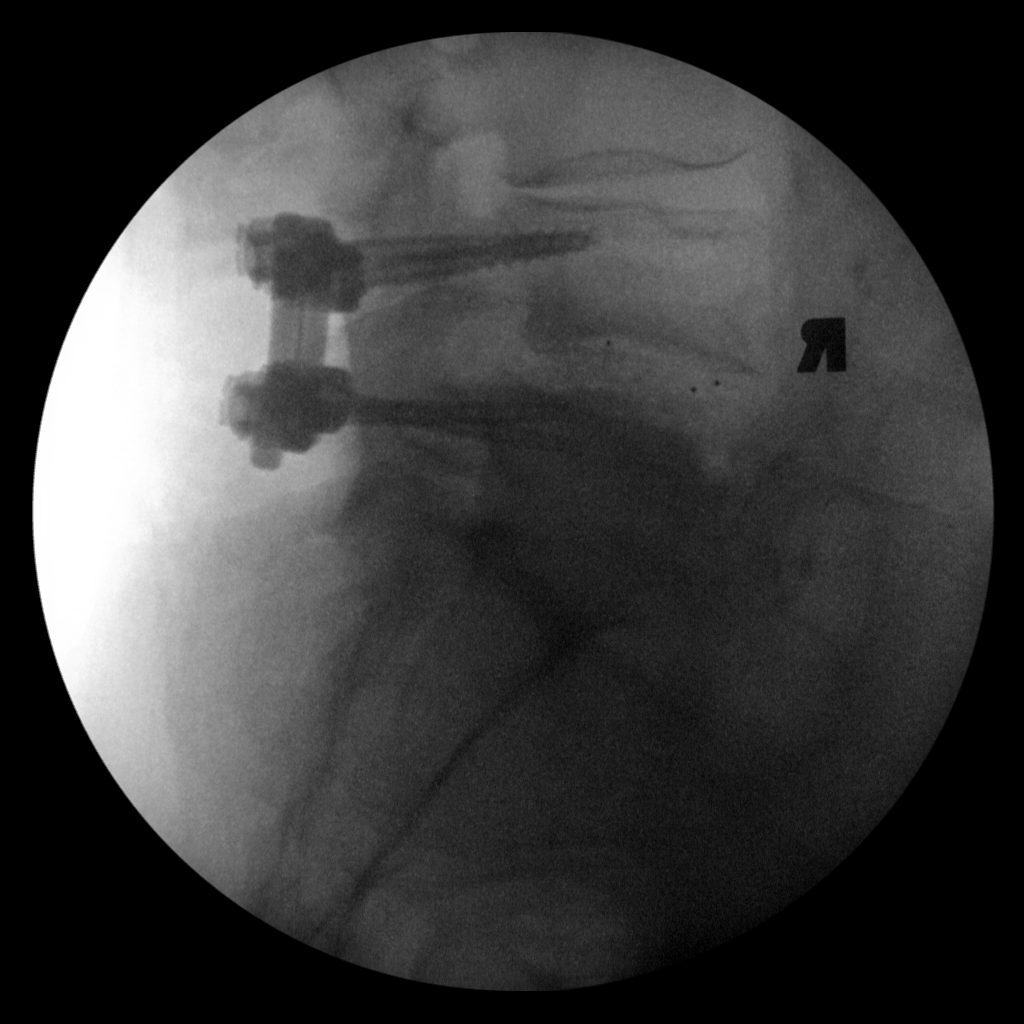

[4 of 4 positions shown; findings below may reference images not displayed]

FINDINGS: Four intraoperative C-arm views submitted for review after surgery.

Fusion L4-5 level with bilateral pedicle screws, posterior
connecting bar and interbody spacer. Decrease in degree of anterior
slippage of L4. No obvious complication.

Marked L5-S1 disc space narrowing.
IMPRESSION: Fusion L4-5 as noted above.

## 2016-03-06 ENCOUNTER — Ambulatory Visit (INDEPENDENT_AMBULATORY_CARE_PROVIDER_SITE_OTHER): Payer: Self-pay | Admitting: Specialist

## 2016-03-09 ENCOUNTER — Ambulatory Visit (INDEPENDENT_AMBULATORY_CARE_PROVIDER_SITE_OTHER): Payer: Self-pay | Admitting: Specialist

## 2016-03-13 ENCOUNTER — Ambulatory Visit (INDEPENDENT_AMBULATORY_CARE_PROVIDER_SITE_OTHER): Payer: Medicare Other | Admitting: Specialist

## 2016-03-13 ENCOUNTER — Encounter (INDEPENDENT_AMBULATORY_CARE_PROVIDER_SITE_OTHER): Payer: Self-pay | Admitting: Specialist

## 2016-03-13 VITALS — BP 153/71 | HR 71 | Ht 61.0 in | Wt 146.0 lb

## 2016-03-13 DIAGNOSIS — M778 Other enthesopathies, not elsewhere classified: Secondary | ICD-10-CM

## 2016-03-13 DIAGNOSIS — M7062 Trochanteric bursitis, left hip: Secondary | ICD-10-CM | POA: Diagnosis not present

## 2016-03-13 DIAGNOSIS — G8929 Other chronic pain: Secondary | ICD-10-CM | POA: Diagnosis not present

## 2016-03-13 DIAGNOSIS — M542 Cervicalgia: Secondary | ICD-10-CM

## 2016-03-13 DIAGNOSIS — M4722 Other spondylosis with radiculopathy, cervical region: Secondary | ICD-10-CM

## 2016-03-13 DIAGNOSIS — M25511 Pain in right shoulder: Secondary | ICD-10-CM

## 2016-03-13 DIAGNOSIS — M7581 Other shoulder lesions, right shoulder: Secondary | ICD-10-CM | POA: Diagnosis not present

## 2016-03-13 MED ORDER — HYDROCODONE-ACETAMINOPHEN 5-325 MG PO TABS: 1.0000 | ORAL_TABLET | Freq: Four times a day (QID) | ORAL | 0 refills | Status: DC | PRN

## 2016-03-13 NOTE — Patient Instructions (Addendum)
    No lifting greater than 5-10 lbs. Avoid bending, stooping and twisting. Avoid overhead use of arms and overhead lifting.   Use ice or moist heat as needed for comfort. Tylenol up to 6 tablets per day for pain. Hydrocodone 1/2 tablet BID for pain.

## 2016-03-13 NOTE — Progress Notes (Signed)
Office Visit Note   Patient: Amber Yang           Date of Birth: [birth year]           MRN: [id number] Visit Date: 03/13/2016              Requested by: No referring provider defined for this encounter. PCP: Pcp Not In System   Assessment & Plan: Visit Diagnoses:  1. Chronic right shoulder pain   2. Cervicalgia of occipito-atlanto-axial region   3. Osteoarthritis of spine with radiculopathy, cervical region   4. Shoulder tendonitis, right   5. Greater trochanteric bursitis of left hip     Plan:No lifting greater than 10 lbs. Avoid bending, stooping and twisting. Avoid overhead use of arms and overhead lifting.   Use ice or moist heat as needed for comfort. Tylenol up to 6 tablets per day for pain. Hydrocodone 1/2 tablet BID for pain.   Follow-Up Instructions: Return in about 3 months (around 06/13/2016) for follow up right shoulder and left hip..   Orders:  No orders of the defined types were placed in this encounter.  Meds ordered this encounter  Medications  . HYDROcodone-acetaminophen (NORCO/VICODIN) 5-325 MG tablet    Sig: Take 1 tablet by mouth every 6 (six) hours as needed for moderate pain. Take 1/2 tablet BID as needed for pain.    Dispense:  30 tablet    Refill:  0      Procedures: Large Joint Inj Date/Time: 03/13/2016 12:43 PM Performed by: Kerrin Champagne Authorized by: Kerrin Champagne   Consent Given by:  Patient Site marked: the procedure site was marked   Timeout: prior to procedure the correct patient, procedure, and site was verified   Indications:  Pain Location:  Shoulder Site:  R subacromial bursa Prep: patient was prepped and draped in usual sterile fashion   Needle Size:  25 G Needle Length:  1.5 inches Approach:  Posterior Ultrasound Guidance: No   Fluoroscopic Guidance: No   Arthrogram: No   Medications:  40 mg methylPREDNISolone acetate 40 MG/ML; 4 mL bupivacaine 0.25 % Aspiration Attempted: No   Patient tolerance:  Patient  tolerated the procedure well with no immediate complications  Band aid applied to the right arm post injection. Ethylene chloride cold spray used to provide local anesthetic for the placement of the needle.     Clinical Data: No additional findings.   Subjective: Chief Complaint  Patient presents with  . Lower Back - Pain, Follow-up    Patient retuning for a 3 month follow up of low back, bilateral legs. States pain feels like its getting worse. Patient states she had injection in her left hip and that is still sore. Pain in the right neck and into the right superior scapula angle, and some radiation into the arm into the upper lateral right arm. Making cakes and using the beater. Had injections into the right shoulder about a year ago. No numbness or tingling into the right arm. Pain in the left ankle is improved. Now with pain in the left lateral hip and buttock. Some cramping left thigh and left legs. 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 She has seen ENT Dr. May at [facility], and he  Recommended following this slow growing non  Malignant tumor. Hearing is decreased. Pain in the right shoulder with lying on the  right side but also discomfort into the right posterior cervicooccipital junction    Review of Systems  Constitutional: Negative.   HENT: Negative.   Eyes: Negative.   Respiratory: Negative.   Cardiovascular: Negative.   Gastrointestinal: Negative.   Endocrine: Negative.   Genitourinary: Negative.   Musculoskeletal: Positive for arthralgias, back pain, gait problem, joint swelling, myalgias, neck pain and neck stiffness.  Allergic/Immunologic: Negative for environmental allergies, food allergies and immunocompromised state.  Neurological: Positive for light-headedness, numbness and headaches.  Hematological: Negative.   Psychiatric/Behavioral: Negative.       Objective: Vital Signs: BP (!) 153/71   Pulse 71   Ht 5\' 1"  (1.549 m)   Wt 146 lb (66.2 kg)   BMI 27.59 kg/m   Physical Exam  Constitutional: She is oriented to person, place, and time. She appears well-developed and well-nourished.  Eyes: EOM are normal. Pupils are equal, round, and reactive to light.  Neck: Normal range of motion. Neck supple.  Pulmonary/Chest: Effort normal and breath sounds normal.  Abdominal: Soft. Bowel sounds are normal.  Musculoskeletal: She exhibits edema and tenderness.  Neurological: She is alert and oriented to person, place, and time.  Skin: Skin is warm and dry.  Psychiatric: She has a normal mood and affect. Her behavior is normal. Judgment and thought content normal.    Right Hip Exam   Tenderness  The patient is experiencing tenderness in the greater trochanter.  Range of Motion  Extension:  0 normal  Flexion:  140 normal  Internal Rotation:  15 normal  External Rotation:  50 normal  Abduction:  20 normal  Adduction:  30 normal   Muscle Strength  Abduction: 5/5  Adduction: 5/5  Flexion: 5/5   Tests  FABER: negative Ober: positive  Other  Erythema: absent Scars: absent Sensation: normal Pulse: present   Left Hip Exam   Tenderness  The patient is experiencing tenderness in the greater trochanter.  Range of Motion  Extension: 0  Flexion: 140  Internal Rotation: 15  External Rotation: 50  Abduction:  20 abnormal  Adduction: 30   Muscle Strength  Abduction: 5/5  Adduction: 5/5  Flexion: 5/5   Tests  FABER: negative Ober: negative  Other  Erythema: absent Scars: absent Sensation: normal Pulse: absent   Right Shoulder Exam   Tenderness  The patient is experiencing tenderness in the acromioclavicular joint.  Range of Motion  Active Abduction:  160 abnormal  Passive Abduction: normal  Extension: normal  Forward Flexion:  140 abnormal  External Rotation:  70 abnormal  Internal Rotation 0 degrees:  abnormal  Internal Rotation 90 degrees: 60   Muscle Strength  Abduction: 5/5  Internal Rotation: 5/5  External Rotation: 5/5  Supraspinatus: 4/5  Subscapularis: 5/5  Biceps: 5/5   Tests  Apprehension: negative Cross Arm: negative Drop Arm: negative Hawkin's test: negative Impingement: positive Sulcus: absent  Other  Erythema: absent Sensation: normal Pulse: present      Specialty Comments:  No specialty comments available.  Imaging: No results found.   PMFS History: Patient Active Problem List   Diagnosis Date Noted  . Postoperative anemia due to acute blood loss 11/23/2013    Priority: Low    Class: Acute  . Shoulder tendinitis 10/05/2014  . Spinal stenosis, lumbar region, with neurogenic claudication 11/21/2013   Past Medical History:  Diagnosis Date  . Anxiety   . Asthma   . Constipation   . COPD (chronic obstructive pulmonary disease) (HCC)   . COPD, severe (HCC)   .  Coronary artery disease   . DDD (degenerative disc disease)   . GERD (gastroesophageal reflux disease)   . H/O echocardiogram 01/27/2013  . Hearing loss   . History of colon polyps   . History of nuclear stress test 12/20/2012  . Hyperlipemia, mixed   . Hypertension   . Hypothyroidism   . Mitral valve regurgitation   . Nocturnal leg cramps   . Peripheral vascular disease (HCC)    followed by Dr Sondra Come  . Rheumatoid arthritis (HCC)   . Shortness of breath    exertional  . UTI (urinary tract infection) 05/2013    No family history on file.  Past Surgical History:  Procedure Laterality Date  . ABDOMINAL HYSTERECTOMY    . CAROTID ENDARTERECTOMY Right 05/2013  . CATARACT EXTRACTION, BILATERAL    . CERVICAL FUSION  2012  . COLONOSCOPY W/ POLYPECTOMY  2008  . EYE SURGERY    . FOOT SURGERY Right   . VASCULAR SURGERY     Social History   Occupational History  . Not on file.   Social History Main Topics  . Smoking status: Former Smoker    Packs/day: 1.00    Years: 40.00     Types: Cigarettes    Quit date: 11/15/2003  . Smokeless tobacco: Never Used  . Alcohol use No  . Drug use: No  . Sexual activity: No

## 2016-03-16 MED ORDER — BUPIVACAINE HCL 0.25 % IJ SOLN
4.0000 mL | INTRAMUSCULAR | Status: AC | PRN
  Administered 2016-03-13: 4 mL via INTRA_ARTICULAR

## 2016-03-16 MED ORDER — METHYLPREDNISOLONE ACETATE 40 MG/ML IJ SUSP
40.0000 mg | INTRAMUSCULAR | Status: AC | PRN
Start: 2016-03-13 — End: 2016-03-13
  Administered 2016-03-13: 40 mg via INTRA_ARTICULAR

## 2016-06-12 ENCOUNTER — Ambulatory Visit (INDEPENDENT_AMBULATORY_CARE_PROVIDER_SITE_OTHER): Payer: Medicare Other | Admitting: Specialist

## 2016-06-12 ENCOUNTER — Encounter (INDEPENDENT_AMBULATORY_CARE_PROVIDER_SITE_OTHER): Payer: Self-pay | Admitting: Specialist

## 2016-06-12 ENCOUNTER — Ambulatory Visit (INDEPENDENT_AMBULATORY_CARE_PROVIDER_SITE_OTHER): Payer: Medicare Other

## 2016-06-12 ENCOUNTER — Encounter (INDEPENDENT_AMBULATORY_CARE_PROVIDER_SITE_OTHER): Payer: Self-pay

## 2016-06-12 VITALS — BP 121/52 | HR 73 | Ht 61.0 in | Wt 146.0 lb

## 2016-06-12 DIAGNOSIS — G8929 Other chronic pain: Secondary | ICD-10-CM

## 2016-06-12 DIAGNOSIS — M5442 Lumbago with sciatica, left side: Secondary | ICD-10-CM | POA: Diagnosis not present

## 2016-06-12 DIAGNOSIS — I739 Peripheral vascular disease, unspecified: Secondary | ICD-10-CM

## 2016-06-12 MED ORDER — HYDROCODONE-ACETAMINOPHEN 5-325 MG PO TABS: 1.0000 | ORAL_TABLET | Freq: Two times a day (BID) | ORAL | 0 refills | Status: DC | PRN

## 2016-06-12 NOTE — Progress Notes (Signed)
Office Visit Note   Patient: Amber Yang           Date of Birth: 1930-06-15           MRN: [id number] Visit Date: 06/12/2016              Requested by: No referring provider defined for this encounter. PCP: Pcp Not In System   Assessment & Plan: Visit Diagnoses:  1. Chronic midline low back pain with left-sided sciatica   2. Claudication in peripheral vascular disease (HCC)   Claudication. Peripheral artery disease.  Plan: Increase lumbar symptoms we will repeat MRI and compare to previous study done July 2016. Follow-up after completion to discuss results. We'll also schedule arterial Dopplers due to the extensive calcification that she has. We'll make appropriate referral depending upon the results. Refilled Norco prescription.  Follow-Up Instructions: Return in 4 weeks (on 07/10/2016).   Orders:  Orders Placed This Encounter  Procedures  . XR Lumbar Spine 2-3 Views  . MR Lumbar Spine W Wo Contrast   Meds ordered this encounter  Medications  . HYDROcodone-acetaminophen (NORCO/VICODIN) 5-325 MG tablet    Sig: Take 1 tablet by mouth every 12 (twelve) hours as needed for moderate pain.    Dispense:  50 tablet    Refill:  0      Procedures: No procedures performed   Clinical Data: No additional findings.   Subjective: Chief Complaint  Patient presents with  . Right Shoulder - Pain, Follow-up  . Left Hip - Pain, Follow-up    Amber Yang is here for for her right shoulder, Low back and left leg pain.  She states that her shoulder is better.  She is complaining of low back pain radiating into the left leg to the foot.  She states that there is a pulling sensation in her leg, that some mornings she can't put her foot on the floor.  Patient continues to have ongoing low back pain and left lower extremity radiculopathy. She also describes having neurogenic claudication symptoms when she is ambulating and also standing on one spot for a little while. In the grocery store  she doesn't lean over a cart to help to relieve some of her symptoms. She's had previous lumbar spine MRI 10/26/2014 and report read L1 to retrolisthesis of 2 mm. Show protrusion of the disc with a focal right posterior lateral herniation. Foraminal encroachment on the right that could affect the exiting L1 nerve root. L2-3 disc degeneration with circumferential protrusion. L3-4 bilateral facet degeneration and ligamentous hypertrophy. Anterolisthesis of 2 mm. Circumferential bulging of the disc. Moderate multifactorial stenosis that could be some dramatic. L4-5 interval posterior decompression and fusion. The central canal is sufficiently patent. Small fluid collection at the decompression site. Foraminal stenosis left worse than right. Some potential to affect the L4 nerve roots typically the left. L5-S1 distant fusion is solid. She describes ongoing pain that radiates from her left low back into her buttock and down to her foot. This is worse at night when she is lying down. No right leg radicular symptoms. Shoulder is doing much better after injection a couple months ago.  Review of Systems  Constitutional: Negative.   Respiratory: Negative.   Genitourinary: Negative.   Neurological: Positive for numbness (Left leg).  Psychiatric/Behavioral: Negative.      Objective: Vital Signs: BP (!) 121/52 (BP Location: Left Arm, Patient Position: Sitting)   Pulse 73   Ht 5\' 1"  (1.549 m)   Wt 146 lb (  66.2 kg)   BMI 27.59 kg/m   Physical Exam  Constitutional: She is oriented to person, place, and time. She appears well-developed. No distress.  Extremity pleasant elderly white female alert and oriented and in no acute distress.  HENT:  Head: Normocephalic and atraumatic.  Eyes: EOM are normal. Pupils are equal, round, and reactive to light.  Neck: Normal range of motion.  Abdominal: She exhibits no distension.  Musculoskeletal:  Gait is slightly antalgic. Lumbar paraspinal tenderness on the left.  She is moderately tender at the left hip greater trochanter bursa. Negative logroll bilateral wrist. She does have pain with left straight leg raise. No focal motor deficits.  Neurological: She is alert and oriented to person, place, and time.  Skin: Skin is warm and dry.  Psychiatric: She has a normal mood and affect.    Ortho Exam  Specialty Comments:  No specialty comments available.  Imaging: Xr Lumbar Spine 2-3 Views  Result Date: 06/12/2016 X-rays lumbar spine shows multilevel lumbar spondylosis. Hardware intact at L4-5. No acute findings. She does have fairly extensive calcification throughout her aorta down into the iliacs.    PMFS History: Patient Active Problem List   Diagnosis Date Noted  . Shoulder tendinitis 10/05/2014  . Postoperative anemia due to acute blood loss 11/23/2013    Class: Acute  . Spinal stenosis, lumbar region, with neurogenic claudication 11/21/2013   Past Medical History:  Diagnosis Date  . Anxiety   . Asthma   . Constipation   . COPD (chronic obstructive pulmonary disease) (HCC)   . COPD, severe (HCC)   . Coronary artery disease   . DDD (degenerative disc disease)   . GERD (gastroesophageal reflux disease)   . H/O echocardiogram 01/27/2013  . Hearing loss   . History of colon polyps   . History of nuclear stress test 12/20/2012  . Hyperlipemia, mixed   . Hypertension   . Hypothyroidism   . Mitral valve regurgitation   . Nocturnal leg cramps   . Peripheral vascular disease (HCC)    followed by Dr Sondra Come  . Rheumatoid arthritis (HCC)   . Shortness of breath    exertional  . UTI (urinary tract infection) 05/2013    No family history on file.  Past Surgical History:  Procedure Laterality Date  . ABDOMINAL HYSTERECTOMY    . CAROTID ENDARTERECTOMY Right 05/2013  . CATARACT EXTRACTION, BILATERAL    . CERVICAL FUSION  2012  . COLONOSCOPY W/ POLYPECTOMY  2008  . EYE SURGERY    . FOOT SURGERY Right   . VASCULAR SURGERY     Social  History   Occupational History  . Not on file.   Social History Main Topics  . Smoking status: Former Smoker    Packs/day: 1.00    Years: 40.00    Types: Cigarettes    Quit date: 11/15/2003  . Smokeless tobacco: Never Used  . Alcohol use No  . Drug use: No  . Sexual activity: No

## 2016-06-15 ENCOUNTER — Telehealth (INDEPENDENT_AMBULATORY_CARE_PROVIDER_SITE_OTHER): Payer: Self-pay | Admitting: *Deleted

## 2016-06-15 ENCOUNTER — Other Ambulatory Visit (INDEPENDENT_AMBULATORY_CARE_PROVIDER_SITE_OTHER): Payer: Self-pay | Admitting: *Deleted

## 2016-06-15 DIAGNOSIS — I739 Peripheral vascular disease, unspecified: Secondary | ICD-10-CM

## 2016-06-15 NOTE — Telephone Encounter (Signed)
Pt has appt scheduled at [facility] tomorrow Feb 20th at 4p with a 345p arrival for the US arterial Duplex, Pt is aware of appt

## 2016-06-16 ENCOUNTER — Ambulatory Visit (HOSPITAL_COMMUNITY)
Admission: RE | Admit: 2016-06-16 | Discharge: 2016-06-16 | Disposition: A | Discharge status: Home / Self Care | Payer: Medicare Other | Source: Ambulatory Visit | Attending: Surgery | Admitting: Surgery

## 2016-06-16 DIAGNOSIS — I739 Peripheral vascular disease, unspecified: Secondary | ICD-10-CM | POA: Insufficient documentation

## 2016-06-16 NOTE — Progress Notes (Signed)
VASCULAR LAB PRELIMINARY  ARTERIAL  ABI completed: Bilateral within normal limits.    RIGHT    LEFT    PRESSURE WAVEFORM  PRESSURE WAVEFORM  BRACHIAL 180 Tri BRACHIAL 171 Tri         AT 179 Tri AT 171 Tri  PT 189 Bi PT 174 Tri           NA   NA    RIGHT LEFT  ABI 1.05 0.97    Farrel Demark, RDMS, RVT   06/16/2016, 4:39 PM

## 2016-06-24 ENCOUNTER — Telehealth (INDEPENDENT_AMBULATORY_CARE_PROVIDER_SITE_OTHER): Payer: Self-pay | Admitting: Radiology

## 2016-06-24 ENCOUNTER — Ambulatory Visit (HOSPITAL_COMMUNITY)
Admission: RE | Admit: 2016-06-24 | Discharge: 2016-06-24 | Disposition: A | Discharge status: Home / Self Care | Payer: Medicare Other | Source: Ambulatory Visit | Attending: Surgery | Admitting: Surgery

## 2016-06-24 DIAGNOSIS — M5135 Other intervertebral disc degeneration, thoracolumbar region: Secondary | ICD-10-CM | POA: Diagnosis not present

## 2016-06-24 DIAGNOSIS — Z981 Arthrodesis status: Secondary | ICD-10-CM | POA: Diagnosis not present

## 2016-06-24 DIAGNOSIS — M5442 Lumbago with sciatica, left side: Secondary | ICD-10-CM

## 2016-06-24 DIAGNOSIS — K8020 Calculus of gallbladder without cholecystitis without obstruction: Secondary | ICD-10-CM | POA: Diagnosis not present

## 2016-06-24 DIAGNOSIS — M4804 Spinal stenosis, thoracic region: Secondary | ICD-10-CM | POA: Diagnosis not present

## 2016-06-24 DIAGNOSIS — M5136 Other intervertebral disc degeneration, lumbar region: Secondary | ICD-10-CM | POA: Insufficient documentation

## 2016-06-24 DIAGNOSIS — M4805 Spinal stenosis, thoracolumbar region: Secondary | ICD-10-CM | POA: Diagnosis not present

## 2016-06-24 DIAGNOSIS — M48061 Spinal stenosis, lumbar region without neurogenic claudication: Secondary | ICD-10-CM | POA: Diagnosis not present

## 2016-06-24 DIAGNOSIS — G8929 Other chronic pain: Secondary | ICD-10-CM

## 2016-06-24 LAB — CREATININE, SERUM
CREATININE: 0.8 mg/dL (ref 0.44–1.00)
GFR calc Af Amer: >60 mL/min (ref >60)

## 2016-06-24 MED ORDER — GADOBENATE DIMEGLUMINE 529 MG/ML IV SOLN
15.0000 mL | Freq: Once | INTRAVENOUS | Status: AC | PRN
  Administered 2016-06-24: 15 mL via INTRAVENOUS

## 2016-06-24 NOTE — Telephone Encounter (Signed)
Brooke calling from [facility] at [facility] wanting to know if patients creatinine was drawn. Last documented one I could find was from 10/2014. Looking at prior office note I did not see this ordered. Patient will have labs drawn there.

## 2016-07-22 ENCOUNTER — Telehealth (INDEPENDENT_AMBULATORY_CARE_PROVIDER_SITE_OTHER): Payer: Self-pay | Admitting: Specialist

## 2016-07-22 NOTE — Telephone Encounter (Signed)
Returned call to patient she advised she had a Ultra sound and MRI in February and do not know the results. Patient also said she need Rx refilled (Hydrocodone)

## 2016-07-22 NOTE — Telephone Encounter (Signed)
Returned call to patient she advised she had a Ultra sound and MRI in February and do not know the results. Patient also said she need Rx refilled (Hydrocodone) The number to contact patient is [phone]

## 2016-07-28 ENCOUNTER — Telehealth (INDEPENDENT_AMBULATORY_CARE_PROVIDER_SITE_OTHER): Payer: Self-pay | Admitting: Specialist

## 2016-07-30 NOTE — Telephone Encounter (Signed)
Routed to front desk.

## 2016-07-31 NOTE — Telephone Encounter (Signed)
Waiting for Dr. Nitka to respond to previous message ?

## 2016-10-01 ENCOUNTER — Encounter (INDEPENDENT_AMBULATORY_CARE_PROVIDER_SITE_OTHER): Payer: Self-pay | Admitting: Specialist

## 2016-10-01 ENCOUNTER — Ambulatory Visit (INDEPENDENT_AMBULATORY_CARE_PROVIDER_SITE_OTHER): Payer: Medicare Other | Admitting: Specialist

## 2016-10-01 VITALS — BP 146/75 | HR 71 | Ht 61.0 in | Wt 146.0 lb

## 2016-10-01 DIAGNOSIS — M5136 Other intervertebral disc degeneration, lumbar region: Secondary | ICD-10-CM | POA: Diagnosis not present

## 2016-10-01 DIAGNOSIS — M5441 Lumbago with sciatica, right side: Secondary | ICD-10-CM | POA: Diagnosis not present

## 2016-10-01 DIAGNOSIS — M48062 Spinal stenosis, lumbar region with neurogenic claudication: Secondary | ICD-10-CM | POA: Diagnosis not present

## 2016-10-01 MED ORDER — TRAMADOL-ACETAMINOPHEN 37.5-325 MG PO TABS: 1.0000 | ORAL_TABLET | Freq: Four times a day (QID) | ORAL | 0 refills | Status: DC | PRN

## 2016-10-01 MED ORDER — GABAPENTIN 100 MG PO CAPS: 100.0000 mg | ORAL_CAPSULE | Freq: Every day | ORAL | 3 refills | Status: DC

## 2016-10-01 NOTE — Progress Notes (Addendum)
Office Visit Note   Patient: Amber Yang           Date of Birth: July 15, 1930           MRN: [id number] Visit Date: 10/01/2016              Requested by: No referring provider defined for this encounter. PCP: System, Pcp Not In   Assessment & Plan: Visit Diagnoses:  1. Spinal stenosis of lumbar region with neurogenic claudication   2. Degenerative disc disease, lumbar   83 year old female with increasing symptoms of recurrent neurogenic claudication. Her studies suggest neuroforamenal narrowing right L2-3 and L3-4 The central canal is fairly well maintained. She is nearly 3 years post decompression and fusion of a severe stenosis with associated spondylolisthesis at the L4-5 level, previous to the surgery she had reach a point where she was nearly wheel chair bound, not as bad at this time. Hopefully ESI with improve  Her pain pattern and allow her to continue to cope with the stenosis. The MRI recently also noted marrow and peridisc enhancement at the T11-12 and T12-L1  Levels. These are sites of previous severe disc collapse. The radiologist expressed concern of infection. Will obtain sed rate and CRP today to rule out infection, Schedule her for transforamenal ESIs right L2 and L3. Call her with the results before the ESIs are done.   Avoid bending, stooping and avoid lifting weights greater than 10 lbs. Avoid prolong standing and walking. Avoid frequent bending and stooping  No lifting greater than 10 lbs. May use ice or moist heat for pain. Weight loss is of benefit. Handicap license is approved. [facility] will call to arrange for epidural steroid injections right L2-3 and L3-4. Laboratory to be done today to assess for any signs of infection. Will review and call with the study reports in the next 3-4 days. .  Follow-Up Instructions: Return in about 4 weeks (around 10/29/2016).   Orders:  No orders of the defined types were placed in this encounter.  No orders of  the defined types were placed in this encounter.     Procedures: No procedures performed   Clinical Data: Findings:  MRI from 2/28/218 shows uptake in the vertebral bodies and perivertebral at the T11-12 and T12-L1 levels, radiologist expressed concerns of infection. History of a hospitalization at [facility] for UTI in the recent 2-3 months.     Subjective: Chief Complaint  Patient presents with  . Lower Back - Follow-up    MRI Review----Lumbar spine    83 year old female with complaints of ongoing back pain and radiation into the left greater and the right. Pain starts in the foot and in the toes and then Draws up the leg to the knee. Drinking gatorade and mustard with minimal help. No night sweats or chills. No cough or productive cough. Concerned that the burning pain is perhaps neuropathy. No history of TB exposure. Not to pulmonary MD in a while.     Review of Systems  HENT: Negative.   Eyes: Negative.   Respiratory: Negative.   Cardiovascular: Negative.   Gastrointestinal: Negative.   Endocrine: Negative.   Genitourinary: Negative.   Musculoskeletal: Positive for back pain and gait problem.  Skin: Negative.   Allergic/Immunologic: Negative.   Neurological: Positive for dizziness, seizures, weakness and light-headedness.  Hematological: Negative.   Psychiatric/Behavioral: Negative.      Objective: Vital Signs: BP (!) 146/75 (BP Location: Left Arm, Patient Position: Sitting)  Pulse 71   Ht 5\' 1"  (1.549 m)   Wt 146 lb (66.2 kg)   BMI 27.59 kg/m   Physical Exam  Ortho Exam  Specialty Comments:  No specialty comments available.  Imaging: No results found.   PMFS History: Patient Active Problem List   Diagnosis Date Noted  . Postoperative anemia due to acute blood loss 11/23/2013    Priority: Low    Class: Acute  . Shoulder tendinitis 10/05/2014  . Spinal stenosis, lumbar region, with neurogenic claudication 11/21/2013   Past Medical  History:  Diagnosis Date  . Anxiety   . Asthma   . Constipation   . COPD (chronic obstructive pulmonary disease) (HCC)   . COPD, severe (HCC)   . Coronary artery disease   . DDD (degenerative disc disease)   . GERD (gastroesophageal reflux disease)   . H/O echocardiogram 01/27/2013  . Hearing loss   . History of colon polyps   . History of nuclear stress test 12/20/2012  . Hyperlipemia, mixed   . Hypertension   . Hypothyroidism   . Mitral valve regurgitation   . Nocturnal leg cramps   . Peripheral vascular disease (HCC)    followed by Dr 12/22/2012  . Rheumatoid arthritis (HCC)   . Shortness of breath    exertional  . UTI (urinary tract infection) 05/2013    No family history on file.  Past Surgical History:  Procedure Laterality Date  . ABDOMINAL HYSTERECTOMY    . CAROTID ENDARTERECTOMY Right 05/2013  . CATARACT EXTRACTION, BILATERAL    . CERVICAL FUSION  2012  . COLONOSCOPY W/ POLYPECTOMY  2008  . EYE SURGERY    . FOOT SURGERY Right   . VASCULAR SURGERY     Social History   Occupational History  . Not on file.   Social History Main Topics  . Smoking status: Former Smoker    Packs/day: 1.00    Years: 40.00    Types: Cigarettes    Quit date: 11/15/2003  . Smokeless tobacco: Never Used  . Alcohol use No  . Drug use: No  . Sexual activity: No

## 2016-10-01 NOTE — Patient Instructions (Addendum)
Avoid bending, stooping and avoid lifting weights greater than 10 lbs. Avoid prolong standing and walking. Avoid frequent bending and stooping  No lifting greater than 10 lbs. May use ice or moist heat for pain. Weight loss is of benefit. Handicap license is approved. [facility] will call to arrange for epidural steroid injections right L2-3 and L3-4. Laboratory to be done today to assess for any signs of infection. Will review and call with the study reports in the next 3-4 days.

## 2016-10-02 LAB — C-REACTIVE PROTEIN: CRP: 6 mg/L (ref <8.0)

## 2016-10-02 LAB — SEDIMENTATION RATE: SED RATE: 8 mm/h (ref 0–30)

## 2016-10-05 ENCOUNTER — Other Ambulatory Visit (INDEPENDENT_AMBULATORY_CARE_PROVIDER_SITE_OTHER): Payer: Self-pay | Admitting: Specialist

## 2016-10-05 DIAGNOSIS — M48062 Spinal stenosis, lumbar region with neurogenic claudication: Secondary | ICD-10-CM

## 2016-10-05 DIAGNOSIS — M5136 Other intervertebral disc degeneration, lumbar region: Secondary | ICD-10-CM

## 2016-10-06 ENCOUNTER — Ambulatory Visit
Admission: RE | Admit: 2016-10-06 | Discharge: 2016-10-06 | Disposition: A | Discharge status: Home / Self Care | Payer: Medicare Other | Source: Ambulatory Visit | Attending: Specialist | Admitting: Specialist

## 2016-10-06 DIAGNOSIS — M5136 Other intervertebral disc degeneration, lumbar region: Secondary | ICD-10-CM

## 2016-10-06 DIAGNOSIS — M48062 Spinal stenosis, lumbar region with neurogenic claudication: Secondary | ICD-10-CM

## 2016-10-06 MED ORDER — IOPAMIDOL (ISOVUE-M 200) INJECTION 41%
1.0000 mL | Freq: Once | INTRAMUSCULAR | Status: AC
  Administered 2016-10-06: 1 mL via EPIDURAL

## 2016-10-06 MED ORDER — METHYLPREDNISOLONE ACETATE 40 MG/ML INJ SUSP (RADIOLOG
120.0000 mg | Freq: Once | INTRAMUSCULAR | Status: AC
  Administered 2016-10-06: 120 mg via EPIDURAL

## 2016-10-06 NOTE — Discharge Instructions (Signed)

## 2016-11-19 ENCOUNTER — Ambulatory Visit (INDEPENDENT_AMBULATORY_CARE_PROVIDER_SITE_OTHER): Payer: Medicare Other | Admitting: Specialist

## 2016-11-19 ENCOUNTER — Encounter (INDEPENDENT_AMBULATORY_CARE_PROVIDER_SITE_OTHER): Payer: Self-pay | Admitting: Specialist

## 2016-11-19 VITALS — BP 128/73 | HR 138 | Ht 61.0 in | Wt 146.0 lb

## 2016-11-19 DIAGNOSIS — M48062 Spinal stenosis, lumbar region with neurogenic claudication: Secondary | ICD-10-CM | POA: Diagnosis not present

## 2016-11-19 NOTE — Progress Notes (Signed)
Office Visit Note   Patient: Amber Yang           Date of Birth: August 13, 1930           MRN: [id number] Visit Date: 11/19/2016              Requested by: No referring provider defined for this encounter. PCP: Amelia Jo, FNP   Assessment & Plan: Visit Diagnoses:  1. Spinal stenosis of lumbar region with neurogenic claudication     Plan: With patient' facet injections. Patient follow up in s ongoing back pain we'll schedule left L1-2, L2-3 and L3-4office in a couple weeks for recheck to check her response. If she has good improvement with that series of injections we will plan to do the right side   Follow-Up Instructions: Return in about 3 weeks (around 12/10/2016).   Orders:  Orders Placed This Encounter  Procedures  . Ambulatory referral to Physical Medicine Rehab   No orders of the defined types were placed in this encounter.     Procedures: No procedures performed   Clinical Data: No additional findings.   Subjective: Chief Complaint  Patient presents with  . Lower Back - Follow-up    HPI Patient returns for evaluation of low back pain and neurogenic claudication. States that she continues to have increased back pain. This becoming more intolerable. He had L2-3 and L3-4 ESI and states that this did help her symptoms for a few hours only. She is wondering what the next step is regards to treatment.Most of her pain is in her back.    Review of Systems No current cardiac pulmonary GI GU issues. Patient does have easy bruising and takes aspirin 81 mg daily.  Objective: Vital Signs: BP 128/73 (BP Location: Left Arm, Patient Position: Sitting)   Pulse (!) 138   Ht 5\' 1"  (1.549 m)   Wt 146 lb (66.2 kg)   BMI 27.59 kg/m   Physical Exam  Constitutional: She is oriented to person, place, and time. She appears well-developed. No distress.  HENT:  Head: Normocephalic and atraumatic.  Pulmonary/Chest: No respiratory distress.  Musculoskeletal: She  exhibits tenderness (Lumbar).  No focal motor deficits.  Neurological: She is alert and oriented to person, place, and time.  Skin: Skin is warm and dry.  Psychiatric: She has a normal mood and affect.    Ortho Exam  Specialty Comments:  No specialty comments available.  Imaging: No results found.   PMFS History: Patient Active Problem List   Diagnosis Date Noted  . Shoulder tendinitis 10/05/2014  . Postoperative anemia due to acute blood loss 11/23/2013    Class: Acute  . Spinal stenosis, lumbar region, with neurogenic claudication 11/21/2013   Past Medical History:  Diagnosis Date  . Anxiety   . Asthma   . Constipation   . COPD (chronic obstructive pulmonary disease) (HCC)   . COPD, severe (HCC)   . Coronary artery disease   . DDD (degenerative disc disease)   . GERD (gastroesophageal reflux disease)   . H/O echocardiogram 01/27/2013  . Hearing loss   . History of colon polyps   . History of nuclear stress test 12/20/2012  . Hyperlipemia, mixed   . Hypertension   . Hypothyroidism   . Mitral valve regurgitation   . Nocturnal leg cramps   . Peripheral vascular disease (HCC)    followed by Dr 12/22/2012  . Rheumatoid arthritis (HCC)   . Shortness of breath    exertional  .  UTI (urinary tract infection) 05/2013    No family history on file.  Past Surgical History:  Procedure Laterality Date  . ABDOMINAL HYSTERECTOMY    . CAROTID ENDARTERECTOMY Right 05/2013  . CATARACT EXTRACTION, BILATERAL    . CERVICAL FUSION  2012  . COLONOSCOPY W/ POLYPECTOMY  2008  . EYE SURGERY    . FOOT SURGERY Right   . VASCULAR SURGERY     Social History   Occupational History  . Not on file.   Social History Main Topics  . Smoking status: Former Smoker    Packs/day: 1.00    Years: 40.00    Types: Cigarettes    Quit date: 11/15/2003  . Smokeless tobacco: Never Used  . Alcohol use No  . Drug use: No  . Sexual activity: No

## 2016-12-07 ENCOUNTER — Encounter (INDEPENDENT_AMBULATORY_CARE_PROVIDER_SITE_OTHER): Payer: Self-pay

## 2016-12-07 ENCOUNTER — Ambulatory Visit (INDEPENDENT_AMBULATORY_CARE_PROVIDER_SITE_OTHER): Payer: Medicare Other | Admitting: Physical Medicine and Rehabilitation

## 2016-12-11 ENCOUNTER — Other Ambulatory Visit (INDEPENDENT_AMBULATORY_CARE_PROVIDER_SITE_OTHER): Payer: Self-pay | Admitting: Surgery

## 2016-12-11 DIAGNOSIS — M48062 Spinal stenosis, lumbar region with neurogenic claudication: Secondary | ICD-10-CM

## 2016-12-16 ENCOUNTER — Ambulatory Visit (INDEPENDENT_AMBULATORY_CARE_PROVIDER_SITE_OTHER): Payer: Medicare Other | Admitting: Orthopedic Surgery

## 2016-12-21 ENCOUNTER — Ambulatory Visit (INDEPENDENT_AMBULATORY_CARE_PROVIDER_SITE_OTHER): Payer: Medicare Other | Admitting: Orthopedic Surgery

## 2016-12-23 ENCOUNTER — Ambulatory Visit
Admission: RE | Admit: 2016-12-23 | Discharge: 2016-12-23 | Disposition: A | Discharge status: Home / Self Care | Payer: Medicare Other | Source: Ambulatory Visit | Attending: Surgery | Admitting: Surgery

## 2016-12-23 DIAGNOSIS — M48062 Spinal stenosis, lumbar region with neurogenic claudication: Secondary | ICD-10-CM

## 2016-12-23 MED ORDER — METHYLPREDNISOLONE ACETATE 40 MG/ML INJ SUSP (RADIOLOG
120.0000 mg | Freq: Once | INTRAMUSCULAR | Status: AC
  Administered 2016-12-23: 120 mg via INTRA_ARTICULAR

## 2016-12-23 NOTE — Discharge Instructions (Signed)

## 2017-01-04 ENCOUNTER — Telehealth (INDEPENDENT_AMBULATORY_CARE_PROVIDER_SITE_OTHER): Payer: Self-pay | Admitting: Radiology

## 2017-01-04 NOTE — Telephone Encounter (Signed)
Ms. Ladd called and lmom that she had injections on 12/07/16 with Dr. Alvester Morin and they did not help her at all. She states that she does not want anymore injections.  Please advise

## 2017-01-25 ENCOUNTER — Encounter (INDEPENDENT_AMBULATORY_CARE_PROVIDER_SITE_OTHER): Payer: Self-pay | Admitting: Specialist

## 2017-01-25 ENCOUNTER — Ambulatory Visit (INDEPENDENT_AMBULATORY_CARE_PROVIDER_SITE_OTHER): Payer: Medicare Other | Admitting: Specialist

## 2017-01-25 VITALS — BP 148/76 | Ht 61.0 in | Wt 146.0 lb

## 2017-01-25 DIAGNOSIS — M4316 Spondylolisthesis, lumbar region: Secondary | ICD-10-CM

## 2017-01-25 DIAGNOSIS — M48062 Spinal stenosis, lumbar region with neurogenic claudication: Secondary | ICD-10-CM | POA: Diagnosis not present

## 2017-01-25 NOTE — Patient Instructions (Signed)
Avoid bending, stooping and avoid lifting weights greater than 10 lbs. Avoid prolong standing and walking. Avoid frequent bending and stooping  No lifting greater than 10 lbs. May use ice or moist heat for pain. Weight loss is of benefit.  

## 2017-01-25 NOTE — Progress Notes (Signed)
Office Visit Note   Patient: Amber Yang           Date of Birth: 1931-03-07           MRN: [id number] Visit Date: 01/25/2017              Requested by: Amelia Jo, FNP [address] [place], [place] [address] PCP: Amelia Jo, FNP   Assessment & Plan: Visit Diagnoses:  1. Spinal stenosis of lumbar region with neurogenic claudication   2. Spondylolisthesis, lumbar region     Plan: Avoid bending, stooping and avoid lifting weights greater than 10 lbs. Avoid prolong standing and walking. Avoid frequent bending and stooping  No lifting greater than 10 lbs. May use ice or moist heat for pain. Weight loss is of benefit.  Follow-Up Instructions: Return in about 3 months (around 04/27/2017).   Orders:  No orders of the defined types were placed in this encounter.  No orders of the defined types were placed in this encounter.     Procedures: No procedures performed   Clinical Data: No additional findings.   Subjective: Chief Complaint  Patient presents with  . Lower Back - Follow-up    83 year old female with L4-5 fusion with increasing back pain bilateral and transverse at the level just above the pelvis. No pain with cough or sneeze. Having an occasional pain with  Standing and walkiing. Intermittant urinary tract infections.     Review of Systems  Constitutional: Negative.   HENT: Negative.   Eyes: Negative.   Respiratory: Negative.   Cardiovascular: Negative.   Gastrointestinal: Negative.   Endocrine: Negative.   Genitourinary: Negative.   Musculoskeletal: Negative.   Skin: Negative.   Allergic/Immunologic: Negative.   Neurological: Negative.   Hematological: Negative.   Psychiatric/Behavioral: Negative.      Objective: Vital Signs: BP (!) 148/76 (BP Location: Left Arm, Patient Position: Sitting)   Ht 5\' 1"  (1.549 m)   Wt 146 lb (66.2 kg)   BMI 27.59 kg/m   Physical Exam  Constitutional: She is oriented to person, place, and  time. She appears well-developed and well-nourished.  HENT:  Head: Normocephalic and atraumatic.  Eyes: Pupils are equal, round, and reactive to light. EOM are normal.  Neck: Normal range of motion. Neck supple.  Pulmonary/Chest: Effort normal and breath sounds normal.  Abdominal: Soft. Bowel sounds are normal.  Neurological: She is alert and oriented to person, place, and time.  Skin: Skin is warm and dry.  Psychiatric: She has a normal mood and affect. Her behavior is normal. Judgment and thought content normal.    Back Exam   Tenderness  The patient is experiencing tenderness in the lumbar.  Range of Motion  Extension: abnormal  Flexion: abnormal  Lateral Bend Right: abnormal  Lateral Bend Left: abnormal  Rotation Right: abnormal  Rotation Left: abnormal   Muscle Strength  Right Quadriceps:  5/5  Left Quadriceps:  5/5  Right Hamstrings:  5/5  Left Hamstrings:  5/5   Tests  Straight leg raise right: negative Straight leg raise left: negative  Reflexes  Patellar: 0/4 Achilles: 0/4 Babinski's sign: normal   Other  Toe Walk: normal Heel Walk: normal Sensation: normal Gait: normal  Erythema: no back redness Scars: absent      Specialty Comments:  No specialty comments available.  Imaging: No results found.   PMFS History: Patient Active Problem List   Diagnosis Date Noted  . Postoperative anemia due to acute  blood loss 11/23/2013    Priority: Low    Class: Acute  . Shoulder tendinitis 10/05/2014  . Spinal stenosis, lumbar region, with neurogenic claudication 11/21/2013   Past Medical History:  Diagnosis Date  . Anxiety   . Asthma   . Constipation   . COPD (chronic obstructive pulmonary disease) (HCC)   . COPD, severe (HCC)   . Coronary artery disease   . DDD (degenerative disc disease)   . GERD (gastroesophageal reflux disease)   . H/O echocardiogram 01/27/2013  . Hearing loss   . History of colon polyps   . History of nuclear stress test  12/20/2012  . Hyperlipemia, mixed   . Hypertension   . Hypothyroidism   . Mitral valve regurgitation   . Nocturnal leg cramps   . Peripheral vascular disease (HCC)    followed by Dr Sondra Come  . Rheumatoid arthritis (HCC)   . Shortness of breath    exertional  . UTI (urinary tract infection) 05/2013    No family history on file.  Past Surgical History:  Procedure Laterality Date  . ABDOMINAL HYSTERECTOMY    . CAROTID ENDARTERECTOMY Right 05/2013  . CATARACT EXTRACTION, BILATERAL    . CERVICAL FUSION  2012  . COLONOSCOPY W/ POLYPECTOMY  2008  . EYE SURGERY    . FOOT SURGERY Right   . VASCULAR SURGERY     Social History   Occupational History  . Not on file.   Social History Main Topics  . Smoking status: Former Smoker    Packs/day: 1.00    Years: 40.00    Types: Cigarettes    Quit date: 11/15/2003  . Smokeless tobacco: Never Used  . Alcohol use No  . Drug use: No  . Sexual activity: No

## 2017-05-03 ENCOUNTER — Encounter (INDEPENDENT_AMBULATORY_CARE_PROVIDER_SITE_OTHER): Payer: Self-pay | Admitting: Specialist

## 2017-05-03 ENCOUNTER — Ambulatory Visit (INDEPENDENT_AMBULATORY_CARE_PROVIDER_SITE_OTHER): Payer: Medicare Other | Admitting: Specialist

## 2017-05-03 VITALS — BP 110/64 | HR 66 | Ht 61.0 in | Wt 146.0 lb

## 2017-05-03 DIAGNOSIS — Z981 Arthrodesis status: Secondary | ICD-10-CM

## 2017-05-03 DIAGNOSIS — M48062 Spinal stenosis, lumbar region with neurogenic claudication: Secondary | ICD-10-CM | POA: Diagnosis not present

## 2017-05-03 NOTE — Patient Instructions (Addendum)
Avoid bending, stooping and avoid lifting weights greater than 10 lbs. Avoid prolong standing and walking. Avoid frequent bending and stooping  No lifting greater than 10 lbs. May use ice or moist heat for pain. Weight loss is of benefit. Handicap license is approved. Try a TENs Unit for pain control, continue to see Dr. Konrad Penta.

## 2017-05-03 NOTE — Progress Notes (Addendum)
Office Visit Note   Patient: Amber Yang           Date of Birth: 06/26/1930           MRN: [id number] Visit Date: 05/03/2017              Requested by: Amelia Jo, FNP [address] [place], [place] [address] PCP: Amelia Jo, FNP   Assessment & Plan: Visit Diagnoses:  1. Spinal stenosis of lumbar region with neurogenic claudication   2. History of lumbar spinal fusion     Plan: Avoid bending, stooping and avoid lifting weights greater than 10 lbs. Avoid prolong standing and walking. Avoid frequent bending and stooping  No lifting greater than 10 lbs. May use ice or moist heat for pain. Weight loss is of benefit. Handicap license is approved. Try a TENs Unit for pain control, continue to see Dr. Konrad Penta.   Follow-Up Instructions: Return in about 3 months (around 08/01/2017).   Orders:  Orders Placed This Encounter  Procedures  . Ambulatory referral to Physical Therapy   No orders of the defined types were placed in this encounter.     Procedures: No procedures performed   Clinical Data: No additional findings.   Subjective: Chief Complaint  Patient presents with  . Lower Back - Follow-up    84 year old female with history of lumbar spondylolisthesis and spinal stenosis, post fusion L4-5 and decompression at L3-4. She has had facet blocks in August and these did not help. Ice packs help. She has had recent onset of atrial fibrillation and is on elloquis 2.5 BID. She is off aspirin. No pace maker. She is no longer taking gabapentin or tramadol. She experiences  Pain in her back is worse with standing and walking. In the kitchen she is painful at the sink or stove.     Review of Systems  Constitutional: Negative.   HENT: Negative.   Eyes: Negative.   Respiratory: Negative.   Cardiovascular: Negative.   Gastrointestinal: Negative.   Endocrine: Negative.   Genitourinary: Negative.   Musculoskeletal: Negative.   Skin: Negative.     Allergic/Immunologic: Negative.   Neurological: Negative.   Hematological: Negative.   Psychiatric/Behavioral: Negative.      Objective: Vital Signs: BP 110/64 (BP Location: Left Arm, Patient Position: Sitting)   Pulse 66   Ht 5\' 1"  (1.549 m)   Wt 146 lb (66.2 kg)   BMI 27.59 kg/m   Physical Exam  Constitutional: She is oriented to person, place, and time. She appears well-developed and well-nourished.  HENT:  Head: Normocephalic and atraumatic.  Eyes: EOM are normal. Pupils are equal, round, and reactive to light.  Neck: Normal range of motion. Neck supple.  Pulmonary/Chest: Effort normal and breath sounds normal.  Abdominal: Soft. Bowel sounds are normal.  Neurological: She is alert and oriented to person, place, and time.  Skin: Skin is warm and dry.  Psychiatric: She has a normal mood and affect. Her behavior is normal. Judgment and thought content normal.    Back Exam   Tenderness  The patient is experiencing tenderness in the lumbar.  Range of Motion  Extension: abnormal  Flexion: normal  Lateral bend right: normal  Lateral bend left: normal  Rotation right: normal  Rotation left: normal   Muscle Strength  Right Quadriceps:  5/5  Left Quadriceps:  5/5  Right Hamstrings:  5/5  Left Hamstrings:  5/5   Tests  Straight leg raise right:  negative Straight leg raise left: negative  Reflexes  Patellar: abnormal Achilles: abnormal Babinski's sign: normal   Other  Toe walk: abnormal Heel walk: abnormal Sensation: normal Gait: normal  Erythema: no back redness Scars: absent  Comments:  SLR is negative. She has typical pattern for persistant lumbar spinal stenosis.      Specialty Comments:  No specialty comments available.  Imaging: No results found.   PMFS History: Patient Active Problem List   Diagnosis Date Noted  . Postoperative anemia due to acute blood loss 11/23/2013    Priority: Low    Class: Acute  . Shoulder tendinitis  10/05/2014  . Spinal stenosis, lumbar region, with neurogenic claudication 11/21/2013   Past Medical History:  Diagnosis Date  . Anxiety   . Asthma   . Constipation   . COPD (chronic obstructive pulmonary disease) (HCC)   . COPD, severe (HCC)   . Coronary artery disease   . DDD (degenerative disc disease)   . GERD (gastroesophageal reflux disease)   . H/O echocardiogram 01/27/2013  . Hearing loss   . History of colon polyps   . History of nuclear stress test 12/20/2012  . Hyperlipemia, mixed   . Hypertension   . Hypothyroidism   . Mitral valve regurgitation   . Nocturnal leg cramps   . Peripheral vascular disease (HCC)    followed by Dr Sondra Come  . Rheumatoid arthritis (HCC)   . Shortness of breath    exertional  . UTI (urinary tract infection) 05/2013    No family history on file.  Past Surgical History:  Procedure Laterality Date  . ABDOMINAL HYSTERECTOMY    . CAROTID ENDARTERECTOMY Right 05/2013  . CATARACT EXTRACTION, BILATERAL    . CERVICAL FUSION  2012  . COLONOSCOPY W/ POLYPECTOMY  2008  . EYE SURGERY    . FOOT SURGERY Right   . VASCULAR SURGERY     Social History   Occupational History  . Not on file  Tobacco Use  . Smoking status: Former Smoker    Packs/day: 1.00    Years: 40.00    Pack years: 40.00    Types: Cigarettes    Last attempt to quit: 11/15/2003    Years since quitting: 13.4  . Smokeless tobacco: Never Used  Substance and Sexual Activity  . Alcohol use: No  . Drug use: No  . Sexual activity: No

## 2017-05-24 ENCOUNTER — Ambulatory Visit: Payer: Medicare Other | Admitting: Physical Therapy

## 2017-05-31 ENCOUNTER — Ambulatory Visit: Payer: Medicare Other | Attending: Specialist | Admitting: Physical Therapy

## 2017-05-31 ENCOUNTER — Other Ambulatory Visit: Payer: Self-pay

## 2017-05-31 ENCOUNTER — Encounter: Payer: Self-pay | Admitting: Physical Therapy

## 2017-05-31 DIAGNOSIS — M6281 Muscle weakness (generalized): Secondary | ICD-10-CM

## 2017-05-31 DIAGNOSIS — R293 Abnormal posture: Secondary | ICD-10-CM | POA: Diagnosis present

## 2017-05-31 DIAGNOSIS — R29898 Other symptoms and signs involving the musculoskeletal system: Secondary | ICD-10-CM | POA: Diagnosis present

## 2017-05-31 DIAGNOSIS — G8929 Other chronic pain: Secondary | ICD-10-CM | POA: Diagnosis present

## 2017-05-31 DIAGNOSIS — M5440 Lumbago with sciatica, unspecified side: Secondary | ICD-10-CM | POA: Diagnosis not present

## 2017-05-31 NOTE — Therapy (Signed)
[facility] Outpatient Rehabilitation [facility] [address]  [address] [place], [place], [address] Phone: [phone]   Fax:  [phone]  Physical Therapy Evaluation  Patient Details  Name: Amber Yang MRN: [id number] Date of Birth: 12/01/1930 Referring Provider: Dr. Vira Browns   Encounter Date: 05/31/2017  PT End of Session - 05/31/17 1738    Visit Number  1    Number of Visits  12    Date for PT Re-Evaluation  07/12/17    Authorization Type  Medicare    PT Start Time  1532    PT Stop Time  1625    PT Time Calculation (min)  53 min    Activity Tolerance  Patient tolerated treatment well    Behavior During Therapy  [facility] for tasks assessed/performed       Past Medical History:  Diagnosis Date  . Anxiety   . Asthma   . Constipation   . COPD (chronic obstructive pulmonary disease) (HCC)   . COPD, severe (HCC)   . Coronary artery disease   . DDD (degenerative disc disease)   . GERD (gastroesophageal reflux disease)   . H/O echocardiogram 01/27/2013  . Hearing loss   . History of colon polyps   . History of nuclear stress test 12/20/2012  . Hyperlipemia, mixed   . Hypertension   . Hypothyroidism   . Mitral valve regurgitation   . Nocturnal leg cramps   . Peripheral vascular disease (HCC)    followed by Dr Sondra Come  . Rheumatoid arthritis (HCC)   . Shortness of breath    exertional  . UTI (urinary tract infection) 05/2013    Past Surgical History:  Procedure Laterality Date  . ABDOMINAL HYSTERECTOMY    . CAROTID ENDARTERECTOMY Right 05/2013  . CATARACT EXTRACTION, BILATERAL    . CERVICAL FUSION  2012  . COLONOSCOPY W/ POLYPECTOMY  2008  . EYE SURGERY    . FOOT SURGERY Right   . VASCULAR SURGERY      There were no vitals filed for this visit.   Subjective Assessment - 05/31/17 1533    Subjective  Patient reporting B LBP (chronic). Reports L4-5 fusion - did good for a little while and now has pain. Feels like this pain is getting worse. Was  seen by another PT - "it wasn't that involved" - reports some increased pain. Reports most difficulty with LE strength and endurance as well as pain. Reports new onset AFib - now on Elequis. Denies N&T, but reports neuropathy. Increase in pain with prolonged standing (cooking, dishes) - uses "belt" with this.     Pertinent History  AFib, HTN, L4-5 fusion, cervical sx    Limitations  Standing;Walking    How long can you stand comfortably?  very short periods    How long can you walk comfortably?  15 minutes    Diagnostic tests  none recently    Patient Stated Goals  "I want to get rid of the pain"     Currently in Pain?  No/denies    Pain Score  0-No pain 10/10 with prolonged standing    Pain Location  Back    Pain Orientation  Right;Left;Lower    Pain Descriptors / Indicators  Sharp;Stabbing    Pain Type  Chronic pain    Pain Onset  More than a month ago    Pain Frequency  Intermittent    Aggravating Factors   prolonged stanging and walking    Pain Relieving Factors  belt, ice, moist heat         [facility] PT Assessment - 05/31/17 1543      Assessment   Medical Diagnosis  Spinal Stenosis of lumbar region with neurologic claudication    Referring Provider  Dr. Vira Browns    Onset Date/Surgical Date  -- years    Next MD Visit  08/02/17    Prior Therapy  yes      Precautions   Precautions  None      Restrictions   Weight Bearing Restrictions  No      Balance Screen   Has the patient fallen in the past 6 months  Yes    How many times?  1    Has the patient had a decrease in activity level because of a fear of falling?   No    Is the patient reluctant to leave their home because of a fear of falling?   No      Home Environment   Living Environment  Private residence    Living Arrangements  Alone    Type of Home  House    Home Access  Stairs to enter    Entrance Stairs-Number of Steps  3-4    Entrance Stairs-Rails  Right    Home Layout  One level    Home Equipment  Walker - 2  wheels;Gilmer Mor - single point      Prior Function   Level of Independence  Independent    Vocation  Retired      Copy Status  Within Functional Limits for tasks assessed      Observation/Other Assessments   Focus on Therapeutic Outcomes (FOTO)   Lumbar Spine: 52 (48% limited, 43% predicted)      Sensation   Light Touch  Appears Intact      Coordination   Gross Motor Movements are Fluid and Coordinated  Yes      Posture/Postural Control   Posture/Postural Control  Postural limitations      ROM / Strength   AROM / PROM / Strength  AROM;Strength      AROM   AROM Assessment Site  Lumbar    Lumbar Flexion  fingertip to anterior ankle - no pain    Lumbar Extension  75% limited - painful    Lumbar - Right Side Bend  50% limited    Lumbar - Left Side Bend  50% limited    Lumbar - Right Rotation  fingertip above joint line - painful    Lumbar - Left Rotation  fingertip above joint line - painful      Strength   Strength Assessment Site  Hip;Knee    Right/Left Hip  Right;Left    Right Hip Flexion  3+/5    Right Hip ABduction  3+/5    Left Hip Flexion  3+/5    Left Hip ABduction  3+/5    Right/Left Knee  Right;Left    Right Knee Flexion  4/5    Right Knee Extension  4/5    Left Knee Flexion  4/5    Left Knee Extension  4/5      Flexibility   Soft Tissue Assessment /Muscle Length  yes    Hamstrings  B mild tightness      Palpation   Palpation comment  TTP along B lumbar paraspinals, B glute/piriformis region, B greater trochanters             Objective measurements completed on examination:  See above findings.      [facility] Adult PT Treatment/Exercise - 05/31/17 1543      Modalities   Modalities  Electrical Stimulation;Cryotherapy      Cryotherapy   Number Minutes Cryotherapy  10 Minutes    Cryotherapy Location  Lumbar Spine    Type of Cryotherapy  Ice pack      Electrical Stimulation   Electrical Stimulation Location  B lower back     Electrical Stimulation Action  IFC    Electrical Stimulation Parameters  to tolerance    Electrical Stimulation Goals  Pain             PT Education - 05/31/17 1738    Education provided  Yes    Education Details  exam findings, POC, TENS usage    Person(s) Educated  Patient    Methods  Explanation    Comprehension  Verbalized understanding       PT Short Term Goals - 05/31/17 1811      PT SHORT TERM GOAL #1   Title  patient to be independent with initial HEP    Status  New    Target Date  06/21/17        PT Long Term Goals - 05/31/17 1812      PT LONG TERM GOAL #1   Title  patient to be independent with advanced HEP    Status  New    Target Date  07/12/17      PT LONG TERM GOAL #2   Title  patient to improve B hip strength to >/= 4+/5 for improved mobility    Status  New    Target Date  07/12/17      PT LONG TERM GOAL #3   Title  patient to report ability to stand at sink/stove for >/= 30 min without pain limiting    Status  New    Target Date  07/12/17      PT LONG TERM GOAL #4   Title  patient to demonstrate good posture and body mechanics as applicable to daily activities    Status  New    Target Date  07/12/17      PT LONG TERM GOAL #5   Title  patient to report ability to perform ADLs and household chores without pain limiting    Status  New    Target Date  07/12/17             Plan - 05/31/17 1808    Clinical Impression Statement  Ms. Passini is a 84 y/o female presenting to OPPT today regarding primary complaints of B low back pain that has been present for quite some time. Patient today with limitations in lumbar AROM, proximal hip strength, and flexibility likely contributing to her pain patterns. Patient today trialed on e-stim as MD requested TENS unit evaluation - with patient subjective reports of pain improvements following treatment. Patient to benefit from PT to addres sthe above listed deficits to allow for improved functional  mobility and QOL.     Clinical Presentation  Stable    Clinical Decision Making  Low    Rehab Potential  Good    PT Frequency  2x / week    PT Duration  6 weeks    PT Treatment/Interventions  ADLs/Self Care Home Management;Cryotherapy;Electrical Stimulation;Moist Heat;Therapeutic exercise;Therapeutic activities;Functional mobility training;Stair training;Gait training;Ultrasound;Balance training;Neuromuscular re-education;Patient/family education;Manual techniques;Vasopneumatic Device;Taping;Dry needling;Passive range of motion    Consulted and Agree with Plan of Care  Patient  Patient will benefit from skilled therapeutic intervention in order to improve the following deficits and impairments:  Pain, Decreased strength, Decreased activity tolerance, Decreased mobility  Visit Diagnosis: Chronic bilateral low back pain with sciatica, sciatica laterality unspecified - Plan: PT plan of care cert/re-cert  Muscle weakness (generalized) - Plan: PT plan of care cert/re-cert  Abnormal posture - Plan: PT plan of care cert/re-cert  Other symptoms and signs involving the musculoskeletal system - Plan: PT plan of care cert/re-cert     Problem List Patient Active Problem List   Diagnosis Date Noted  . Shoulder tendinitis 10/05/2014  . Postoperative anemia due to acute blood loss 11/23/2013    Class: Acute  . Spinal stenosis, lumbar region, with neurogenic claudication 11/21/2013     Kipp Laurence, PT, DPT 05/31/17 6:22 PM   [facility] [address]  [address] [place], [place], [address] Phone: [phone]   Fax:  [phone]  Name: Amber Yang MRN: [id number] Date of Birth: [birth year]

## 2017-06-07 ENCOUNTER — Ambulatory Visit: Payer: Medicare Other | Admitting: Physical Therapy

## 2017-06-07 ENCOUNTER — Encounter: Payer: Self-pay | Admitting: Physical Therapy

## 2017-06-07 DIAGNOSIS — R29898 Other symptoms and signs involving the musculoskeletal system: Secondary | ICD-10-CM

## 2017-06-07 DIAGNOSIS — M5440 Lumbago with sciatica, unspecified side: Secondary | ICD-10-CM | POA: Diagnosis not present

## 2017-06-07 DIAGNOSIS — R293 Abnormal posture: Secondary | ICD-10-CM

## 2017-06-07 DIAGNOSIS — G8929 Other chronic pain: Secondary | ICD-10-CM

## 2017-06-07 DIAGNOSIS — M6281 Muscle weakness (generalized): Secondary | ICD-10-CM

## 2017-06-07 NOTE — Therapy (Signed)
[facility] Outpatient Rehabilitation [facility] [address]  [address] [place], [place], [address] Phone: [phone]   Fax:  [phone]  Physical Therapy Treatment  Patient Details  Name: Amber Yang MRN: [id number] Date of Birth: Jun 30, 1930 Referring Provider: Dr. Vira Browns   Encounter Date: 06/07/2017  PT End of Session - 06/07/17 1103    Visit Number  2    Number of Visits  12    Date for PT Re-Evaluation  07/12/17    Authorization Type  Medicare    PT Start Time  1058    PT Stop Time  1158    PT Time Calculation (min)  60 min    Activity Tolerance  Patient tolerated treatment well    Behavior During Therapy  [facility] for tasks assessed/performed       Past Medical History:  Diagnosis Date  . Anxiety   . Asthma   . Constipation   . COPD (chronic obstructive pulmonary disease) (HCC)   . COPD, severe (HCC)   . Coronary artery disease   . DDD (degenerative disc disease)   . GERD (gastroesophageal reflux disease)   . H/O echocardiogram 01/27/2013  . Hearing loss   . History of colon polyps   . History of nuclear stress test 12/20/2012  . Hyperlipemia, mixed   . Hypertension   . Hypothyroidism   . Mitral valve regurgitation   . Nocturnal leg cramps   . Peripheral vascular disease (HCC)    followed by Dr Sondra Come  . Rheumatoid arthritis (HCC)   . Shortness of breath    exertional  . UTI (urinary tract infection) 05/2013    Past Surgical History:  Procedure Laterality Date  . ABDOMINAL HYSTERECTOMY    . CAROTID ENDARTERECTOMY Right 05/2013  . CATARACT EXTRACTION, BILATERAL    . CERVICAL FUSION  2012  . COLONOSCOPY W/ POLYPECTOMY  2008  . EYE SURGERY    . FOOT SURGERY Right   . VASCULAR SURGERY      There were no vitals filed for this visit.  Subjective Assessment - 06/07/17 1102    Subjective  Felt well for a while after she left last session - has had to continue to utilize lumbar brace intermittently due to pain    Pertinent History   AFib, HTN, L4-5 fusion, cervical sx    Patient Stated Goals  "I want to get rid of the pain"     Currently in Pain?  No/denies    Pain Score  0-No pain                      [facility] Adult PT Treatment/Exercise - 06/07/17 1104      Exercises   Exercises  Lumbar      Lumbar Exercises: Stretches   Passive Hamstring Stretch  Right;Left;3 reps;30 seconds    Passive Hamstring Stretch Limitations  manual by PT    Other Lumbar Stretch Exercise  seated red ball rollouts - fwd/R/L 5 x 5 sec each      Lumbar Exercises: Aerobic   Nustep  L4 x 5 min      Lumbar Exercises: Standing   Heel Raises  15 reps    Heel Raises Limitations  B UE support    Other Standing Lumbar Exercises  hip abduction - B UE support x 10 each side    Other Standing Lumbar Exercises  hip extension - B UE support - 10 reps each LE  Lumbar Exercises: Seated   Sit to Stand  10 reps    Sit to Stand Limitations  no UE support; slow eccentric lowering      Lumbar Exercises: Supine   Ab Set  10 reps;3 seconds    Bridge  10 reps limited height and tolerance due to LE cramping    Straight Leg Raise  10 reps    Straight Leg Raises Limitations  bilateral      Lumbar Exercises: Sidelying   Clam  Right;Left;15 reps red tband      Modalities   Modalities  Electrical Stimulation;Moist Heat      Moist Heat Therapy   Number Minutes Moist Heat  15 Minutes    Moist Heat Location  Lumbar Spine      Electrical Stimulation   Electrical Stimulation Location  B lower back    Electrical Stimulation Action  IFC    Electrical Stimulation Parameters  to tolerance    Electrical Stimulation Goals  Pain               PT Short Term Goals - 06/07/17 1103      PT SHORT TERM GOAL #1   Title  patient to be independent with initial HEP    Status  On-going        PT Long Term Goals - 06/07/17 1103      PT LONG TERM GOAL #1   Title  patient to be independent with advanced HEP    Status  On-going      PT  LONG TERM GOAL #2   Title  patient to improve B hip strength to >/= 4+/5 for improved mobility    Status  On-going      PT LONG TERM GOAL #3   Title  patient to report ability to stand at sink/stove for >/= 30 min without pain limiting    Status  On-going      PT LONG TERM GOAL #4   Title  patient to demonstrate good posture and body mechanics as applicable to daily activities    Status  On-going      PT LONG TERM GOAL #5   Title  patient to report ability to perform ADLs and household chores without pain limiting    Status  On-going            Plan - 06/07/17 1103    Clinical Impression Statement  Ms. Compere doing well today - felt good relief from estim at last visit, therefore continued today at end of treatment with good tolerance. Good work today with all proximal hip and core strengthening activities with no increase in pain, does require VC and demonstration for control of movements for appropriate muscle activation. Did initiate HEP today for gentle hip/core strengthening with good carryover. Will continue to progress towards goals.    PT Treatment/Interventions  ADLs/Self Care Home Management;Cryotherapy;Electrical Stimulation;Moist Heat;Therapeutic exercise;Therapeutic activities;Functional mobility training;Stair training;Gait training;Ultrasound;Balance training;Neuromuscular re-education;Patient/family education;Manual techniques;Vasopneumatic Device;Taping;Dry needling;Passive range of motion    Consulted and Agree with Plan of Care  Patient       Patient will benefit from skilled therapeutic intervention in order to improve the following deficits and impairments:  Pain, Decreased strength, Decreased activity tolerance, Decreased mobility  Visit Diagnosis: Chronic bilateral low back pain with sciatica, sciatica laterality unspecified  Muscle weakness (generalized)  Abnormal posture  Other symptoms and signs involving the musculoskeletal system     Problem  List Patient Active Problem List   Diagnosis Date Noted  .  Shoulder tendinitis 10/05/2014  . Postoperative anemia due to acute blood loss 11/23/2013    Class: Acute  . Spinal stenosis, lumbar region, with neurogenic claudication 11/21/2013    Kipp Laurence, PT, DPT 06/07/17 12:01 PM   [facility] [address]  [address] [place], [place], [address] Phone: [phone]   Fax:  [phone]  Name: KORTNY LIRETTE MRN: [id number] Date of Birth: 11/06/1930

## 2017-06-07 NOTE — Patient Instructions (Signed)
Bridge   Lie back, legs bent. Inhale, pressing hips up. Keeping ribs in, lengthen lower back. Exhale, rolling down along spine from top. Repeat __10__ times. Do _2_ sessions per day.  Strengthening: Straight Leg Raise (Phase 1)   Tighten muscles on front of right thigh, then lift leg __~8__ inches from surface, keeping knee locked.  Repeat _10___ times per set. Do _2___ sets per session.   Pelvic Tilt: Posterior - Legs Bent (Supine)   Tighten stomach and flatten back by rolling pelvis down. Hold __5__ seconds. Relax. Repeat _10-15___ times per set.

## 2017-06-09 ENCOUNTER — Ambulatory Visit: Payer: Medicare Other

## 2017-06-09 DIAGNOSIS — M5440 Lumbago with sciatica, unspecified side: Principal | ICD-10-CM

## 2017-06-09 DIAGNOSIS — R293 Abnormal posture: Secondary | ICD-10-CM

## 2017-06-09 DIAGNOSIS — M6281 Muscle weakness (generalized): Secondary | ICD-10-CM

## 2017-06-09 DIAGNOSIS — G8929 Other chronic pain: Secondary | ICD-10-CM

## 2017-06-09 DIAGNOSIS — R29898 Other symptoms and signs involving the musculoskeletal system: Secondary | ICD-10-CM

## 2017-06-09 NOTE — Therapy (Signed)
[facility] Outpatient Rehabilitation [facility] [address]  [address] [place], [place], [address] Phone: [phone]   Fax:  [phone]  Physical Therapy Treatment  Patient Details  Name: Amber Yang MRN: [id number] Date of Birth: 1930-06-06 Referring Provider: Dr. Vira Browns   Encounter Date: 06/09/2017  PT End of Session - 06/09/17 1109    Visit Number  3    Number of Visits  12    Date for PT Re-Evaluation  07/12/17    Authorization Type  Medicare    PT Start Time  1058    PT Stop Time  1203    PT Time Calculation (min)  65 min    Activity Tolerance  Patient tolerated treatment well    Behavior During Therapy  [facility] for tasks assessed/performed       Past Medical History:  Diagnosis Date  . Anxiety   . Asthma   . Constipation   . COPD (chronic obstructive pulmonary disease) (HCC)   . COPD, severe (HCC)   . Coronary artery disease   . DDD (degenerative disc disease)   . GERD (gastroesophageal reflux disease)   . H/O echocardiogram 01/27/2013  . Hearing loss   . History of colon polyps   . History of nuclear stress test 12/20/2012  . Hyperlipemia, mixed   . Hypertension   . Hypothyroidism   . Mitral valve regurgitation   . Nocturnal leg cramps   . Peripheral vascular disease (HCC)    followed by Dr Sondra Come  . Rheumatoid arthritis (HCC)   . Shortness of breath    exertional  . UTI (urinary tract infection) 05/2013    Past Surgical History:  Procedure Laterality Date  . ABDOMINAL HYSTERECTOMY    . CAROTID ENDARTERECTOMY Right 05/2013  . CATARACT EXTRACTION, BILATERAL    . CERVICAL FUSION  2012  . COLONOSCOPY W/ POLYPECTOMY  2008  . EYE SURGERY    . FOOT SURGERY Right   . VASCULAR SURGERY      There were no vitals filed for this visit.  Subjective Assessment - 06/09/17 1105    Subjective  Pt. doing well today.  Notes some discomfort yesterday from, "my neuropathy"    Pertinent History  AFib, HTN, L4-5 fusion, cervical sx    Patient  Stated Goals  "I want to get rid of the pain"     Currently in Pain?  No/denies    Pain Score  0-No pain    Multiple Pain Sites  No                      [facility] Adult PT Treatment/Exercise - 06/09/17 1111      Lumbar Exercises: Aerobic   Nustep  L4 x 5 min      Lumbar Exercises: Standing   Heel Raises  15 reps    Heel Raises Limitations  toe raise + heel raise; B UE support at counter     Functional Squats  10 reps    Functional Squats Limitations  "mini" sqaut at counter     Other Standing Lumbar Exercises  hip abduction - B UE support x 10 each side    Other Standing Lumbar Exercises  hip extension - B UE support - 10 reps each LE      Lumbar Exercises: Seated   Sit to Stand  10 reps    Sit to Stand Limitations  no UE support; slow eccentric lowering    Other Seated Lumbar  Exercises  Hip adduction squeeze 5" x 10 reps     Other Seated Lumbar Exercises  B hip abd/ER (clam) with yellow TB at knees x 10 reps       Lumbar Exercises: Supine   Ab Set  3 seconds;15 reps    Bridge  10 reps limited ROM     Straight Leg Raise  10 reps    Straight Leg Raises Limitations  bilateral      Modalities   Modalities  Electrical Stimulation;Moist Heat      Moist Heat Therapy   Number Minutes Moist Heat  10 Minutes    Moist Heat Location  Lumbar Spine      Electrical Stimulation   Electrical Stimulation Location  B lower back    Electrical Stimulation Action  IFC    Electrical Stimulation Parameters  to tolerance' 15'    Electrical Stimulation Goals  Pain               PT Short Term Goals - 06/07/17 1103      PT SHORT TERM GOAL #1   Title  patient to be independent with initial HEP    Status  On-going        PT Long Term Goals - 06/07/17 1103      PT LONG TERM GOAL #1   Title  patient to be independent with advanced HEP    Status  On-going      PT LONG TERM GOAL #2   Title  patient to improve B hip strength to >/= 4+/5 for improved mobility    Status   On-going      PT LONG TERM GOAL #3   Title  patient to report ability to stand at sink/stove for >/= 30 min without pain limiting    Status  On-going      PT LONG TERM GOAL #4   Title  patient to demonstrate good posture and body mechanics as applicable to daily activities    Status  On-going      PT LONG TERM GOAL #5   Title  patient to report ability to perform ADLs and household chores without pain limiting    Status  On-going            Plan - 06/09/17 1141    Clinical Impression Statement  Pt. reporting some increased pain yesterday however feeling well today.  Tolerated all standing and supine lumbopelvic strengthening activities well today.  Did require cueing today to maintain abdominal activation with therex.  Did not have questions regarding HEP.  Ended with E-stim/moist heat per pt. request to lumbar spine to reduce post-exercise pain.    PT Treatment/Interventions  ADLs/Self Care Home Management;Cryotherapy;Electrical Stimulation;Moist Heat;Therapeutic exercise;Therapeutic activities;Functional mobility training;Stair training;Gait training;Ultrasound;Balance training;Neuromuscular re-education;Patient/family education;Manual techniques;Vasopneumatic Device;Taping;Dry needling;Passive range of motion       Patient will benefit from skilled therapeutic intervention in order to improve the following deficits and impairments:  Pain, Decreased strength, Decreased activity tolerance, Decreased mobility  Visit Diagnosis: Chronic bilateral low back pain with sciatica, sciatica laterality unspecified  Muscle weakness (generalized)  Abnormal posture  Other symptoms and signs involving the musculoskeletal system     Problem List Patient Active Problem List   Diagnosis Date Noted  . Shoulder tendinitis 10/05/2014  . Postoperative anemia due to acute blood loss 11/23/2013    Class: Acute  . Spinal stenosis, lumbar region, with neurogenic claudication 11/21/2013     Kermit Balo, PTA 06/09/17 12:05 PM  [facility] [address]  [address] [place], [place], [address] Phone: [phone]   Fax:  [phone]  Name: Amber Yang MRN: [id number] Date of Birth: 1931-01-27

## 2017-06-14 ENCOUNTER — Ambulatory Visit: Payer: Medicare Other | Admitting: Physical Therapy

## 2017-06-14 ENCOUNTER — Encounter: Payer: Self-pay | Admitting: Physical Therapy

## 2017-06-14 DIAGNOSIS — M5440 Lumbago with sciatica, unspecified side: Principal | ICD-10-CM

## 2017-06-14 DIAGNOSIS — G8929 Other chronic pain: Secondary | ICD-10-CM

## 2017-06-14 DIAGNOSIS — R29898 Other symptoms and signs involving the musculoskeletal system: Secondary | ICD-10-CM

## 2017-06-14 DIAGNOSIS — R293 Abnormal posture: Secondary | ICD-10-CM

## 2017-06-14 DIAGNOSIS — M6281 Muscle weakness (generalized): Secondary | ICD-10-CM

## 2017-06-14 NOTE — Therapy (Signed)
[facility] Outpatient Rehabilitation [facility] [address]  [address] [place], [place], [address] Phone: [phone]   Fax:  [phone]  Physical Therapy Treatment  Patient Details  Name: Amber Yang MRN: [id number] Date of Birth: 07/10/1930 Referring Provider: Dr. Vira Browns   Encounter Date: 06/14/2017  PT End of Session - 06/14/17 1351    Visit Number  4    Number of Visits  12    Date for PT Re-Evaluation  07/12/17    Authorization Type  Medicare    PT Start Time  1349    PT Stop Time  1449    PT Time Calculation (min)  60 min    Activity Tolerance  Patient tolerated treatment well    Behavior During Therapy  [facility] for tasks assessed/performed       Past Medical History:  Diagnosis Date  . Anxiety   . Asthma   . Constipation   . COPD (chronic obstructive pulmonary disease) (HCC)   . COPD, severe (HCC)   . Coronary artery disease   . DDD (degenerative disc disease)   . GERD (gastroesophageal reflux disease)   . H/O echocardiogram 01/27/2013  . Hearing loss   . History of colon polyps   . History of nuclear stress test 12/20/2012  . Hyperlipemia, mixed   . Hypertension   . Hypothyroidism   . Mitral valve regurgitation   . Nocturnal leg cramps   . Peripheral vascular disease (HCC)    followed by Dr Sondra Come  . Rheumatoid arthritis (HCC)   . Shortness of breath    exertional  . UTI (urinary tract infection) 05/2013    Past Surgical History:  Procedure Laterality Date  . ABDOMINAL HYSTERECTOMY    . CAROTID ENDARTERECTOMY Right 05/2013  . CATARACT EXTRACTION, BILATERAL    . CERVICAL FUSION  2012  . COLONOSCOPY W/ POLYPECTOMY  2008  . EYE SURGERY    . FOOT SURGERY Right   . VASCULAR SURGERY      There were no vitals filed for this visit.  Subjective Assessment - 06/14/17 1350    Subjective  Feeling well today    Pertinent History  AFib, HTN, L4-5 fusion, cervical sx    Patient Stated Goals  "I want to get rid of the pain"     Currently  in Pain?  No/denies    Pain Score  0-No pain                      [facility] Adult PT Treatment/Exercise - 06/14/17 1352      Lumbar Exercises: Aerobic   Nustep  L4 x 6 min      Lumbar Exercises: Standing   Heel Raises  15 reps    Heel Raises Limitations  B UE support    Functional Squats  15 reps    Functional Squats Limitations  "mini" sqaut at counter     Other Standing Lumbar Exercises  B - 4 way hip kickers - red tband x 10 reps - 2 pole A    Other Standing Lumbar Exercises  B side stepping - yellow tband x 25 feet each direction - B HHA      Lumbar Exercises: Seated   Long Arc Quad on [place]  Both;15 reps seated on disc    Hip Flexion on Ball  Both;15 reps seated on disc    Other Seated Lumbar Exercises  rows - red tband x 15 reps - seated on  disc      Modalities   Modalities  Electrical Stimulation;Moist Heat      Moist Heat Therapy   Number Minutes Moist Heat  15 Minutes    Moist Heat Location  Lumbar Spine      Electrical Stimulation   Electrical Stimulation Location  B lower back    Electrical Stimulation Action  IFC    Electrical Stimulation Parameters  to tolerance    Electrical Stimulation Goals  Pain               PT Short Term Goals - 06/07/17 1103      PT SHORT TERM GOAL #1   Title  patient to be independent with initial HEP    Status  On-going        PT Long Term Goals - 06/07/17 1103      PT LONG TERM GOAL #1   Title  patient to be independent with advanced HEP    Status  On-going      PT LONG TERM GOAL #2   Title  patient to improve B hip strength to >/= 4+/5 for improved mobility    Status  On-going      PT LONG TERM GOAL #3   Title  patient to report ability to stand at sink/stove for >/= 30 min without pain limiting    Status  On-going      PT LONG TERM GOAL #4   Title  patient to demonstrate good posture and body mechanics as applicable to daily activities    Status  On-going      PT LONG TERM GOAL #5   Title   patient to report ability to perform ADLs and household chores without pain limiting    Status  On-going            Plan - 06/14/17 1351    Clinical Impression Statement  Patient doing well today - PT session focusing more on weight bearing activities to promote functional strengthening with good tolerance. Reports good relief from estim - has been approved for device, however, awaiting device from vendor. Will plan to review usage of device once available to patient. Will continue to progress towards goals.     PT Treatment/Interventions  ADLs/Self Care Home Management;Cryotherapy;Electrical Stimulation;Moist Heat;Therapeutic exercise;Therapeutic activities;Functional mobility training;Stair training;Gait training;Ultrasound;Balance training;Neuromuscular re-education;Patient/family education;Manual techniques;Vasopneumatic Device;Taping;Dry needling;Passive range of motion    Consulted and Agree with Plan of Care  Patient       Patient will benefit from skilled therapeutic intervention in order to improve the following deficits and impairments:  Pain, Decreased strength, Decreased activity tolerance, Decreased mobility  Visit Diagnosis: Chronic bilateral low back pain with sciatica, sciatica laterality unspecified  Muscle weakness (generalized)  Abnormal posture  Other symptoms and signs involving the musculoskeletal system     Problem List Patient Active Problem List   Diagnosis Date Noted  . Shoulder tendinitis 10/05/2014  . Postoperative anemia due to acute blood loss 11/23/2013    Class: Acute  . Spinal stenosis, lumbar region, with neurogenic claudication 11/21/2013    Kipp Laurence, PT, DPT 06/14/17 2:49 PM   [facility] [address]  [address] [place], [place], [address] Phone: [phone]   Fax:  [phone]  Name: Amber Yang MRN: [id number] Date of Birth: 1930/05/27

## 2017-06-16 ENCOUNTER — Ambulatory Visit: Payer: Medicare Other

## 2017-06-16 DIAGNOSIS — M6281 Muscle weakness (generalized): Secondary | ICD-10-CM

## 2017-06-16 DIAGNOSIS — G8929 Other chronic pain: Secondary | ICD-10-CM

## 2017-06-16 DIAGNOSIS — R293 Abnormal posture: Secondary | ICD-10-CM

## 2017-06-16 DIAGNOSIS — M5440 Lumbago with sciatica, unspecified side: Principal | ICD-10-CM

## 2017-06-16 DIAGNOSIS — R29898 Other symptoms and signs involving the musculoskeletal system: Secondary | ICD-10-CM

## 2017-06-16 NOTE — Therapy (Signed)
[facility] Outpatient Rehabilitation [facility] [address]  [address] [place], [place], [address] Phone: [phone]   Fax:  [phone]  Physical Therapy Treatment  Patient Details  Name: Amber Yang MRN: [id number] Date of Birth: 1931/04/06 Referring Provider: Dr. Vira Browns   Encounter Date: 06/16/2017  PT End of Session - 06/16/17 1057    Visit Number  5    Number of Visits  12    Date for PT Re-Evaluation  07/12/17    Authorization Type  Medicare    PT Start Time  1051    PT Stop Time  1155    PT Time Calculation (min)  64 min    Activity Tolerance  Patient tolerated treatment well    Behavior During Therapy  [facility] for tasks assessed/performed       Past Medical History:  Diagnosis Date  . Anxiety   . Asthma   . Constipation   . COPD (chronic obstructive pulmonary disease) (HCC)   . COPD, severe (HCC)   . Coronary artery disease   . DDD (degenerative disc disease)   . GERD (gastroesophageal reflux disease)   . H/O echocardiogram 01/27/2013  . Hearing loss   . History of colon polyps   . History of nuclear stress test 12/20/2012  . Hyperlipemia, mixed   . Hypertension   . Hypothyroidism   . Mitral valve regurgitation   . Nocturnal leg cramps   . Peripheral vascular disease (HCC)    followed by Dr Sondra Come  . Rheumatoid arthritis (HCC)   . Shortness of breath    exertional  . UTI (urinary tract infection) 05/2013    Past Surgical History:  Procedure Laterality Date  . ABDOMINAL HYSTERECTOMY    . CAROTID ENDARTERECTOMY Right 05/2013  . CATARACT EXTRACTION, BILATERAL    . CERVICAL FUSION  2012  . COLONOSCOPY W/ POLYPECTOMY  2008  . EYE SURGERY    . FOOT SURGERY Right   . VASCULAR SURGERY      There were no vitals filed for this visit.  Subjective Assessment - 06/16/17 1055    Subjective  Doing well and felt good following last visit.  Pt. noting improvement in back pain frequency since start therapy.      Pertinent History  AFib, HTN,  L4-5 fusion, cervical sx    Patient Stated Goals  "I want to get rid of the pain"     Currently in Pain?  No/denies    Pain Score  0-No pain    Multiple Pain Sites  No                      [facility] Adult PT Treatment/Exercise - 06/16/17 1116      Neuro Re-ed    Neuro Re-ed Details   Side step to airex pad and over x 10 reps; supervision; intermittent counter support       Lumbar Exercises: Aerobic   Nustep  L4 x 6 min      Lumbar Exercises: Standing   Heel Raises  15 reps    Heel Raises Limitations  toe raise + heel raise     Row  Both;10 reps    Theraband Level (Row)  Level 2 (Red)    Other Standing Lumbar Exercises  High knee march at counter x 10 reps each  cues provided to ensure pt. maintain core engagement     Other Standing Lumbar Exercises  B side stepping - yellow tband x  25 feet each direction - B HHA cues required for proper pacing       Lumbar Exercises: Seated   Sit to Stand  15 reps    Sit to Stand Limitations  no UE support; slow eccentric lowering      Lumbar Exercises: Supine   Ab Set  3 seconds;15 reps    Bridge with Ball Squeeze  10 reps;3 seconds limited ROM       Lumbar Exercises: Sidelying   Clam  Right;Left;15 reps    Clam Limitations  red looped TB at knees       Moist Heat Therapy   Number Minutes Moist Heat  15 Minutes    Moist Heat Location  Lumbar Spine      Electrical Stimulation   Electrical Stimulation Location  B lower back    Electrical Stimulation Action  IFC    Electrical Stimulation Parameters  to tolerance, 15'    Electrical Stimulation Goals  Pain             PT Education - 06/16/17 1146    Education provided  Yes    Education Details  clam shell with red looped TB issued to pt.     Person(s) Educated  Patient    Methods  Explanation;Demonstration;Verbal cues;Handout    Comprehension  Verbalized understanding;Returned demonstration;Verbal cues required;Need further instruction       PT Short Term Goals -  06/07/17 1103      PT SHORT TERM GOAL #1   Title  patient to be independent with initial HEP    Status  On-going        PT Long Term Goals - 06/07/17 1103      PT LONG TERM GOAL #1   Title  patient to be independent with advanced HEP    Status  On-going      PT LONG TERM GOAL #2   Title  patient to improve B hip strength to >/= 4+/5 for improved mobility    Status  On-going      PT LONG TERM GOAL #3   Title  patient to report ability to stand at sink/stove for >/= 30 min without pain limiting    Status  On-going      PT LONG TERM GOAL #4   Title  patient to demonstrate good posture and body mechanics as applicable to daily activities    Status  On-going      PT LONG TERM GOAL #5   Title  patient to report ability to perform ADLs and household chores without pain limiting    Status  On-going            Plan - 06/16/17 1058    Clinical Impression Statement  Keyshla reporting some improvement in LBP frequency with daily activities since starting therapy.  Still have not received device from supplier thus not issued to pt. today.  Pt. tolerated progression of sit<>stand and addition of 6" step-up today without issue.  HEP updated with clamshell with red TB resistance as pt. able to perform with good overall technique.  Ended treatment with E-stim/moist to lumbar spine per pt. request to reduce post-exercise swelling and pain.  Pt. progressing well toward goals.      PT Treatment/Interventions  ADLs/Self Care Home Management;Cryotherapy;Electrical Stimulation;Moist Heat;Therapeutic exercise;Therapeutic activities;Functional mobility training;Stair training;Gait training;Ultrasound;Balance training;Neuromuscular re-education;Patient/family education;Manual techniques;Vasopneumatic Device;Taping;Dry needling;Passive range of motion    Consulted and Agree with Plan of Care  Patient       Patient  will benefit from skilled therapeutic intervention in order to improve the following  deficits and impairments:  Pain, Decreased strength, Decreased activity tolerance, Decreased mobility  Visit Diagnosis: Chronic bilateral low back pain with sciatica, sciatica laterality unspecified  Muscle weakness (generalized)  Abnormal posture  Other symptoms and signs involving the musculoskeletal system     Problem List Patient Active Problem List   Diagnosis Date Noted  . Shoulder tendinitis 10/05/2014  . Postoperative anemia due to acute blood loss 11/23/2013    Class: Acute  . Spinal stenosis, lumbar region, with neurogenic claudication 11/21/2013    Kermit Balo, PTA 06/16/17 11:55 AM  [facility] [address]  [address] [place], [place], [address] Phone: [phone]   Fax:  [phone]  Name: Amber Yang MRN: [id number] Date of Birth: [birth year]

## 2017-06-21 ENCOUNTER — Ambulatory Visit: Payer: Medicare Other

## 2017-06-21 DIAGNOSIS — M5440 Lumbago with sciatica, unspecified side: Principal | ICD-10-CM

## 2017-06-21 DIAGNOSIS — R293 Abnormal posture: Secondary | ICD-10-CM

## 2017-06-21 DIAGNOSIS — R29898 Other symptoms and signs involving the musculoskeletal system: Secondary | ICD-10-CM

## 2017-06-21 DIAGNOSIS — M6281 Muscle weakness (generalized): Secondary | ICD-10-CM

## 2017-06-21 DIAGNOSIS — G8929 Other chronic pain: Secondary | ICD-10-CM

## 2017-06-21 NOTE — Therapy (Signed)
[facility] [address]  [address] [place], [place], [address] Phone: [phone]   Fax:  [phone]  Physical Therapy Treatment  Patient Details  Name: LAKIETA MAKEY MRN: [id number] Date of Birth: 09-04-1930 Referring Provider: Dr. Vira Browns   Encounter Date: 06/21/2017  PT End of Session - 06/21/17 1102    Visit Number  6    Number of Visits  12    Date for PT Re-Evaluation  07/12/17    Authorization Type  Medicare    PT Start Time  1057    PT Stop Time  1153    PT Time Calculation (min)  56 min    Activity Tolerance  Patient tolerated treatment well    Behavior During Therapy  [facility] for tasks assessed/performed       Past Medical History:  Diagnosis Date  . Anxiety   . Asthma   . Constipation   . COPD (chronic obstructive pulmonary disease) (HCC)   . COPD, severe (HCC)   . Coronary artery disease   . DDD (degenerative disc disease)   . GERD (gastroesophageal reflux disease)   . H/O echocardiogram 01/27/2013  . Hearing loss   . History of colon polyps   . History of nuclear stress test 12/20/2012  . Hyperlipemia, mixed   . Hypertension   . Hypothyroidism   . Mitral valve regurgitation   . Nocturnal leg cramps   . Peripheral vascular disease (HCC)    followed by Dr Sondra Come  . Rheumatoid arthritis (HCC)   . Shortness of breath    exertional  . UTI (urinary tract infection) 05/2013    Past Surgical History:  Procedure Laterality Date  . ABDOMINAL HYSTERECTOMY    . CAROTID ENDARTERECTOMY Right 05/2013  . CATARACT EXTRACTION, BILATERAL    . CERVICAL FUSION  2012  . COLONOSCOPY W/ POLYPECTOMY  2008  . EYE SURGERY    . FOOT SURGERY Right   . VASCULAR SURGERY      There were no vitals filed for this visit.  Subjective Assessment - 06/21/17 1101    Subjective  Doing well - still having some back pain while doing kitchen work.      Pertinent History  AFib, HTN, L4-5 fusion, cervical sx    Patient Stated  Goals  "I want to get rid of the pain"     Currently in Pain?  No/denies    Pain Score  0-No pain    Multiple Pain Sites  No                      [facility] Adult PT Treatment/Exercise - 06/21/17 1103      Self-Care   Self-Care  Other Self-Care Comments    Other Self-Care Comments   Reviewed proper body mechanics with kitchen work placing foot on edge of bottom shelf to reduce lumbar strain; Reviewed carrying groceries in both hands to reduce lumbar strain; Reviewed putting pillow between knees when sleeping to reduce strain on hips and lumbar spine       Lumbar Exercises: Aerobic   Nustep  L5 x 6 min      Lumbar Exercises: Standing   Functional Squats  15 reps    Functional Squats Limitations  TRX - tactile cues to prevent excessive anterior wt. shift; chair used as target     Wall Slides  5 reps;3 seconds terminated due to difficulty controlling movement     Wall Slides Limitations  leaning on orange p-ball     Row  --    Theraband Level (Row)  --    Other Standing Lumbar Exercises  6" forward step-up x 15 reps each LE; 1 HH assist     Other Standing Lumbar Exercises  B side stepping - yellow tband x 30 feet each direction - B HHA      Lumbar Exercises: Seated   Sit to Stand  15 reps    Sit to Stand Limitations  no UE support; slow eccentric lowering      Lumbar Exercises: Supine   Bridge  --    Bridge with clamshell  15 reps    Bridge with [employer] Limitations  with sustained hip abd/ER isometrics into red TB at knees       Lumbar Exercises: Sidelying   Clam  Right;Left;15 reps    Clam Limitations  red looped TB at knees       Knee/Hip Exercises: Standing   Hip Flexion  Right;Left;10 reps;Knee bent    Hip Flexion Limitations  1#; 2 HH assist from therapist      Hip Abduction  Right;Left;10 reps;Knee straight    Abduction Limitations  1#; 2 HH assist from therapist     Hip Extension  Right;Left;10 reps;Knee straight    Extension Limitations  1#; 2 HH assist  from therapist       Modalities   Modalities  Electrical Stimulation;Moist Heat      Moist Heat Therapy   Number Minutes Moist Heat  15 Minutes    Moist Heat Location  Lumbar Spine      Electrical Stimulation   Electrical Stimulation Location  B lower back    Electrical Stimulation Action  IFC    Electrical Stimulation Parameters  to tolerance, 15'    Electrical Stimulation Goals  Pain             PT Education - 06/21/17 1146    Education provided  Yes    Education Details  Sit <> stand with pushoff from knees; posture and body mechanics handout    Person(s) Educated  Patient    Methods  Explanation;Demonstration;Verbal cues;Handout    Comprehension  Verbalized understanding;Returned demonstration;Verbal cues required;Need further instruction       PT Short Term Goals - 06/21/17 1112      PT SHORT TERM GOAL #1   Title  patient to be independent with initial HEP    Status  Achieved        PT Long Term Goals - 06/07/17 1103      PT LONG TERM GOAL #1   Title  patient to be independent with advanced HEP    Status  On-going      PT LONG TERM GOAL #2   Title  patient to improve B hip strength to >/= 4+/5 for improved mobility    Status  On-going      PT LONG TERM GOAL #3   Title  patient to report ability to stand at sink/stove for >/= 30 min without pain limiting    Status  On-going      PT LONG TERM GOAL #4   Title  patient to demonstrate good posture and body mechanics as applicable to daily activities    Status  On-going      PT LONG TERM GOAL #5   Title  patient to report ability to perform ADLs and household chores without pain limiting    Status  On-going  Plan - 06/21/17 1102    Clinical Impression Statement  Pt. reports she felt good following last visit however does still have some back pain with prolonged kitchen work.  Tolerated progression of standing hip strengthening activities today well.  Discussed proper body mechanics with  kitchen work and carrying groceries to reduce lumbar strain with pt. verbalizing understanding.  Ended treatment with application of moist heat/E-stim to lumbar spine to reduce post-exercise pain as pt. noting good benefit from this in past treatments.  Pt. noting that she does feel like her pain levels are improving since starting therapy.  Progressing well toward goals.      PT Treatment/Interventions  ADLs/Self Care Home Management;Cryotherapy;Electrical Stimulation;Moist Heat;Therapeutic exercise;Therapeutic activities;Functional mobility training;Stair training;Gait training;Ultrasound;Balance training;Neuromuscular re-education;Patient/family education;Manual techniques;Vasopneumatic Device;Taping;Dry needling;Passive range of motion    Consulted and Agree with Plan of Care  Patient       Patient will benefit from skilled therapeutic intervention in order to improve the following deficits and impairments:  Pain, Decreased strength, Decreased activity tolerance, Decreased mobility  Visit Diagnosis: Chronic bilateral low back pain with sciatica, sciatica laterality unspecified  Muscle weakness (generalized)  Abnormal posture  Other symptoms and signs involving the musculoskeletal system     Problem List Patient Active Problem List   Diagnosis Date Noted  . Shoulder tendinitis 10/05/2014  . Postoperative anemia due to acute blood loss 11/23/2013    Class: Acute  . Spinal stenosis, lumbar region, with neurogenic claudication 11/21/2013    Kermit Balo, PTA 06/21/17 12:13 PM  [facility] [address]  [address] [place], [place], [address] Phone: [phone]   Fax:  [phone]  Name: DELAINEY WINSTANLEY MRN: [id number] Date of Birth: Nov 13, 1930

## 2017-06-21 NOTE — Patient Instructions (Signed)

## 2017-06-23 ENCOUNTER — Ambulatory Visit: Payer: Medicare Other

## 2017-06-23 DIAGNOSIS — M6281 Muscle weakness (generalized): Secondary | ICD-10-CM

## 2017-06-23 DIAGNOSIS — R293 Abnormal posture: Secondary | ICD-10-CM

## 2017-06-23 DIAGNOSIS — M5440 Lumbago with sciatica, unspecified side: Secondary | ICD-10-CM | POA: Diagnosis not present

## 2017-06-23 DIAGNOSIS — R29898 Other symptoms and signs involving the musculoskeletal system: Secondary | ICD-10-CM

## 2017-06-23 DIAGNOSIS — G8929 Other chronic pain: Secondary | ICD-10-CM

## 2017-06-23 NOTE — Therapy (Signed)
[facility] [address]  [address] [place], [place], [address] Phone: [phone]   Fax:  [phone]  Physical Therapy Treatment  Patient Details  Name: Amber Yang MRN: [id number] Date of Birth: Mar 21, 1931 Referring Provider: Dr. Vira Browns   Encounter Date: 06/23/2017  PT End of Session - 06/23/17 1126    Visit Number  7    Number of Visits  12    Date for PT Re-Evaluation  07/12/17    Authorization Type  Medicare    PT Start Time  1101    PT Stop Time  1202    PT Time Calculation (min)  61 min    Activity Tolerance  Patient tolerated treatment well    Behavior During Therapy  [facility] for tasks assessed/performed       Past Medical History:  Diagnosis Date  . Anxiety   . Asthma   . Constipation   . COPD (chronic obstructive pulmonary disease) (HCC)   . COPD, severe (HCC)   . Coronary artery disease   . DDD (degenerative disc disease)   . GERD (gastroesophageal reflux disease)   . H/O echocardiogram 01/27/2013  . Hearing loss   . History of colon polyps   . History of nuclear stress test 12/20/2012  . Hyperlipemia, mixed   . Hypertension   . Hypothyroidism   . Mitral valve regurgitation   . Nocturnal leg cramps   . Peripheral vascular disease (HCC)    followed by Dr Sondra Come  . Rheumatoid arthritis (HCC)   . Shortness of breath    exertional  . UTI (urinary tract infection) 05/2013    Past Surgical History:  Procedure Laterality Date  . ABDOMINAL HYSTERECTOMY    . CAROTID ENDARTERECTOMY Right 05/2013  . CATARACT EXTRACTION, BILATERAL    . CERVICAL FUSION  2012  . COLONOSCOPY W/ POLYPECTOMY  2008  . EYE SURGERY    . FOOT SURGERY Right   . VASCULAR SURGERY      There were no vitals filed for this visit.  Subjective Assessment - 06/23/17 1109    Subjective  Pt. reporting she felt good following last visit.      Pertinent History  AFib, HTN, L4-5 fusion, cervical sx    Patient Stated Goals  "I want to  get rid of the pain"     Currently in Pain?  No/denies    Pain Score  0-No pain    Multiple Pain Sites  No                      [facility] Adult PT Treatment/Exercise - 06/23/17 1129      Self-Care   Self-Care  Other Self-Care Comments    Other Self-Care Comments   Instruction and demo of proper use of home TENS/MT unit with review of proper electrode placement; pt. verbalized/demo understanding of proper device use however will likely need further instruction       Lumbar Exercises: Aerobic   Nustep  L5 x 6 min      Lumbar Exercises: Standing   Other Standing Lumbar Exercises  3-way hip kicker with yellow looped TB at ankles x 15 reps at counter       Lumbar Exercises: Seated   Other Seated Lumbar Exercises  Seated clam shell with red TB x 15 reps     Other Seated Lumbar Exercises  Seated adduction ball squeeze 5" x 15 reps      Lumbar  Exercises: Supine   Bridge with clamshell  15 reps    Bridge with [employer] Limitations  with sustained hip abd/ER isometrics into red TB at knees              PT Education - 06/23/17 1159    Education provided  Yes    Education Details  Handout on proper TENS electrode placement, issued pt. TENS/MT device including instruction on proper set-up, use, and electrode placement    Person(s) Educated  Patient    Methods  Explanation;Demonstration;Verbal cues;Handout    Comprehension  Verbalized understanding;Returned demonstration;Verbal cues required;Need further instruction       PT Short Term Goals - 06/21/17 1112      PT SHORT TERM GOAL #1   Title  patient to be independent with initial HEP    Status  Achieved        PT Long Term Goals - 06/07/17 1103      PT LONG TERM GOAL #1   Title  patient to be independent with advanced HEP    Status  On-going      PT LONG TERM GOAL #2   Title  patient to improve B hip strength to >/= 4+/5 for improved mobility    Status  On-going      PT LONG TERM GOAL #3   Title   patient to report ability to stand at sink/stove for >/= 30 min without pain limiting    Status  On-going      PT LONG TERM GOAL #4   Title  patient to demonstrate good posture and body mechanics as applicable to daily activities    Status  On-going      PT LONG TERM GOAL #5   Title  patient to report ability to perform ADLs and household chores without pain limiting    Status  On-going            Plan - 06/23/17 1138    Clinical Impression Statement  [name] reporting she felt good following last visit.  Tolerated all standing and seated proximal hip strengthening activities well in treatment without onset of LBP.  Pt. issued home TENS/MT device today as she has received answer back regarding insurance coverage.  Significant time spent in treatment today instructing pt. on proper device set up, use, and electrode placement with handout issued to pt. regarding general TENS unit info.  Pt. verbalized and demonstrated understanding of proper use of the device however, will likely benefit from further skilled instruction in future visits.      PT Treatment/Interventions  ADLs/Self Care Home Management;Cryotherapy;Electrical Stimulation;Moist Heat;Therapeutic exercise;Therapeutic activities;Functional mobility training;Stair training;Gait training;Ultrasound;Balance training;Neuromuscular re-education;Patient/family education;Manual techniques;Vasopneumatic Device;Taping;Dry needling;Passive range of motion    Consulted and Agree with Plan of Care  Patient       Patient will benefit from skilled therapeutic intervention in order to improve the following deficits and impairments:  Pain, Decreased strength, Decreased activity tolerance, Decreased mobility  Visit Diagnosis: Chronic bilateral low back pain with sciatica, sciatica laterality unspecified  Muscle weakness (generalized)  Abnormal posture  Other symptoms and signs involving the musculoskeletal system     Problem List Patient  Active Problem List   Diagnosis Date Noted  . Shoulder tendinitis 10/05/2014  . Postoperative anemia due to acute blood loss 11/23/2013    Class: Acute  . Spinal stenosis, lumbar region, with neurogenic claudication 11/21/2013    Kermit Balo, PTA 06/23/17 12:01 PM  [facility] [address]  [place] [address]  [address] [place], [place], [address] Phone: [phone]   Fax:  [phone]  Name: Amber Yang MRN: [id number] Date of Birth: [birth year]

## 2017-06-23 NOTE — Patient Instructions (Signed)
TENS stands for Transcutaneous Electrical Nerve Stimulation. In other words, electrical impulses are allowed to pass through the skin in order to excite a nerve.   Purpose and Use of TENS:  TENS is a method used to manage acute and chronic pain without the use of drugs. It has been effective in managing pain associated with surgery, sprains, strains, trauma, rheumatoid arthritis, and neuralgias. It is a non-addictive, low risk, and non-invasive technique used to control pain. It is not, by any means, a curative form of treatment.   How TENS Works:  Most TENS units are a small pocket-sized unit powered by one 9 volt battery. Attached to the outside of the unit are two lead wires where two pins and/or snaps connect on each wire. All units come with a set of four reusable pads or electrodes. These are placed on the skin surrounding the area involved. By inserting the leads into  the pads, the electricity can pass from the unit making the circuit complete.  As the intensity is turned up slowly, the electrical current enters the body from the electrodes through the skin to the surrounding nerve fibers. This triggers the release of hormones from within the body. These hormones contain pain relievers. By increasing the circulation of these hormones, the person's pain may be lessened. It is also believed that the electrical stimulation itself helps to block the pain messages being sent to the brain, thus also decreasing the body's perception of pain.   Hazards:  TENS units are NOT to be used by patients with PACEMAKERS, DEFIBRILLATORS, DIABETIC PUMPS, PREGNANT WOMEN, and patients with SEIZURE DISORDERS.  TENS units are NOT to be used over the heart, throat, brain, or spinal cord.  One of the major side effects from the TENS unit may be skin irritation. Some people may develop a rash if they are sensitive to the materials used in the electrodes or the connecting wires.   Wear the unit for 15'.   Avoid overuse  due the body getting used to the stem making it not as effective over time.    

## 2017-06-28 ENCOUNTER — Ambulatory Visit: Payer: Medicare Other | Attending: Specialist

## 2017-06-28 DIAGNOSIS — R29898 Other symptoms and signs involving the musculoskeletal system: Secondary | ICD-10-CM

## 2017-06-28 DIAGNOSIS — G8929 Other chronic pain: Secondary | ICD-10-CM | POA: Diagnosis present

## 2017-06-28 DIAGNOSIS — M5440 Lumbago with sciatica, unspecified side: Secondary | ICD-10-CM | POA: Insufficient documentation

## 2017-06-28 DIAGNOSIS — R293 Abnormal posture: Secondary | ICD-10-CM | POA: Diagnosis present

## 2017-06-28 DIAGNOSIS — M6281 Muscle weakness (generalized): Secondary | ICD-10-CM | POA: Diagnosis present

## 2017-06-28 NOTE — Therapy (Signed)
[facility] Outpatient Rehabilitation [facility] [address]  [address] [place], [place], [address] Phone: [phone]   Fax:  [phone]  Physical Therapy Treatment  Patient Details  Name: Amber Yang MRN: [id number] Date of Birth: May 26, 1930 Referring Provider: Dr. Vira Browns   Encounter Date: 06/28/2017  PT End of Session - 06/28/17 1107    Visit Number  8    Number of Visits  12    Date for PT Re-Evaluation  07/12/17    Authorization Type  Medicare    PT Start Time  1101    PT Stop Time  1201    PT Time Calculation (min)  60 min    Activity Tolerance  Patient tolerated treatment well    Behavior During Therapy  [facility] for tasks assessed/performed       Past Medical History:  Diagnosis Date  . Anxiety   . Asthma   . Constipation   . COPD (chronic obstructive pulmonary disease) (HCC)   . COPD, severe (HCC)   . Coronary artery disease   . DDD (degenerative disc disease)   . GERD (gastroesophageal reflux disease)   . H/O echocardiogram 01/27/2013  . Hearing loss   . History of colon polyps   . History of nuclear stress test 12/20/2012  . Hyperlipemia, mixed   . Hypertension   . Hypothyroidism   . Mitral valve regurgitation   . Nocturnal leg cramps   . Peripheral vascular disease (HCC)    followed by Dr Sondra Come  . Rheumatoid arthritis (HCC)   . Shortness of breath    exertional  . UTI (urinary tract infection) 05/2013    Past Surgical History:  Procedure Laterality Date  . ABDOMINAL HYSTERECTOMY    . CAROTID ENDARTERECTOMY Right 05/2013  . CATARACT EXTRACTION, BILATERAL    . CERVICAL FUSION  2012  . COLONOSCOPY W/ POLYPECTOMY  2008  . EYE SURGERY    . FOOT SURGERY Right   . VASCULAR SURGERY      There were no vitals filed for this visit.  Subjective Assessment - 06/28/17 1108    Subjective  Pt. reporting she has been having some "cramping" in back of legs with bridging activity.      Pertinent History  AFib, HTN, L4-5 fusion, cervical sx     Patient Stated Goals  "I want to get rid of the pain"     Currently in Pain?  No/denies    Pain Score  0-No pain    Pain Orientation  --    Multiple Pain Sites  No                      [facility] Adult PT Treatment/Exercise - 06/28/17 1120      Self-Care   Self-Care  Other Self-Care Comments    Other Self-Care Comments   Attempted to use mirror for pt. self-application of electrode pads to her lower back area; unable to perform pad placement correctly due to difficulty with L UE thus had daughter witness proper electrode placement for her to assist. pt. in proper placement for home use      Lumbar Exercises: Aerobic   Nustep  L5 x 6 min      Lumbar Exercises: Machines for Strengthening   Leg Press  15# x 15 reps       Lumbar Exercises: Standing   Functional Squats  15 reps    Functional Squats Limitations  Counter - chair behind pt. to  encourage pt. wt. shift     Other Standing Lumbar Exercises  8" forward step up with 1 HH assist x 10 reps each LE    Other Standing Lumbar Exercises  B side stepping - yellow tband x 30 feet each direction - B HHA      Lumbar Exercises: Seated   Other Seated Lumbar Exercises  Seated clam shell with red TB x 15 reps       Lumbar Exercises: Supine   Bridge  15 reps improved ROM       Knee/Hip Exercises: Standing   Hip Flexion  Right;Left;10 reps;Knee bent    Hip Flexion Limitations  2# at ankles; counter    Hip Abduction  Right;Left;10 reps;Knee straight    Abduction Limitations  2# at ankles; counter     Hip Extension  Right;Left;10 reps;Knee straight    Extension Limitations  2# at ankles; counter       Moist Heat Therapy   Number Minutes Moist Heat  10 Minutes    Moist Heat Location  Lumbar Spine      Electrical Stimulation   Electrical Stimulation Location  B lower back    Electrical Stimulation Action  IFC    Electrical Stimulation Parameters  to tolerance, 10'    Electrical Stimulation Goals  Pain                PT Short Term Goals - 06/21/17 1112      PT SHORT TERM GOAL #1   Title  patient to be independent with initial HEP    Status  Achieved        PT Long Term Goals - 06/07/17 1103      PT LONG TERM GOAL #1   Title  patient to be independent with advanced HEP    Status  On-going      PT LONG TERM GOAL #2   Title  patient to improve B hip strength to >/= 4+/5 for improved mobility    Status  On-going      PT LONG TERM GOAL #3   Title  patient to report ability to stand at sink/stove for >/= 30 min without pain limiting    Status  On-going      PT LONG TERM GOAL #4   Title  patient to demonstrate good posture and body mechanics as applicable to daily activities    Status  On-going      PT LONG TERM GOAL #5   Title  patient to report ability to perform ADLs and household chores without pain limiting    Status  On-going            Plan - 06/28/17 1115    Clinical Impression Statement  Amber Yang doing well today.  Reports she feels her legs have gotten stronger since starting therapy.  Feels she is able to use "back belt" less at home.  Tolerated progression of standing hip strengthening activities well today.  Did require cueing for proper pacing with therex activities for proper control.  Reviewed proper electrode placement with pt. and daughter today to encourage use of home TENS unit.  Pt. will likely require assistance for electrode placement from daughter with home use due to difficulty placing electrodes with L UE.  Ended with moist heat/E-stim to decrease post-exercise LBP.  Pt. progressing well.        PT Treatment/Interventions  ADLs/Self Care Home Management;Cryotherapy;Electrical Stimulation;Moist Heat;Therapeutic exercise;Therapeutic activities;Functional mobility training;Stair training;Gait training;Ultrasound;Balance training;Neuromuscular re-education;Patient/family education;Manual techniques;Vasopneumatic Device;Taping;Dry  needling;Passive range of  motion    Consulted and Agree with Plan of Care  Patient       Patient will benefit from skilled therapeutic intervention in order to improve the following deficits and impairments:  Pain, Decreased strength, Decreased activity tolerance, Decreased mobility  Visit Diagnosis: Chronic bilateral low back pain with sciatica, sciatica laterality unspecified  Muscle weakness (generalized)  Abnormal posture  Other symptoms and signs involving the musculoskeletal system     Problem List Patient Active Problem List   Diagnosis Date Noted  . Shoulder tendinitis 10/05/2014  . Postoperative anemia due to acute blood loss 11/23/2013    Class: Acute  . Spinal stenosis, lumbar region, with neurogenic claudication 11/21/2013    Kermit Balo, PTA 06/28/17 12:13 PM  [facility] [address]  [address] [place], [place], [address] Phone: [phone]   Fax:  [phone]  Name: Amber Yang MRN: [id number] Date of Birth: [birth year]

## 2017-06-30 ENCOUNTER — Ambulatory Visit: Payer: Medicare Other

## 2017-06-30 DIAGNOSIS — R293 Abnormal posture: Secondary | ICD-10-CM

## 2017-06-30 DIAGNOSIS — R29898 Other symptoms and signs involving the musculoskeletal system: Secondary | ICD-10-CM

## 2017-06-30 DIAGNOSIS — M5440 Lumbago with sciatica, unspecified side: Principal | ICD-10-CM

## 2017-06-30 DIAGNOSIS — G8929 Other chronic pain: Secondary | ICD-10-CM

## 2017-06-30 DIAGNOSIS — M6281 Muscle weakness (generalized): Secondary | ICD-10-CM

## 2017-06-30 NOTE — Therapy (Signed)
[facility] Outpatient Rehabilitation [facility] [address]  [address] [place], [place], [address] Phone: [phone]   Fax:  [phone]  Physical Therapy Treatment  Patient Details  Name: Amber Yang MRN: [id number] Date of Birth: 23-Apr-1931 Referring Provider: Dr. Vira Browns   Encounter Date: 06/30/2017  PT End of Session - 06/30/17 1110    Visit Number  9    Number of Visits  12    Date for PT Re-Evaluation  07/12/17    Authorization Type  Medicare    PT Start Time  1106    PT Stop Time  1150    PT Time Calculation (min)  44 min    Activity Tolerance  Patient tolerated treatment well    Behavior During Therapy  [facility] for tasks assessed/performed       Past Medical History:  Diagnosis Date  . Anxiety   . Asthma   . Constipation   . COPD (chronic obstructive pulmonary disease) (HCC)   . COPD, severe (HCC)   . Coronary artery disease   . DDD (degenerative disc disease)   . GERD (gastroesophageal reflux disease)   . H/O echocardiogram 01/27/2013  . Hearing loss   . History of colon polyps   . History of nuclear stress test 12/20/2012  . Hyperlipemia, mixed   . Hypertension   . Hypothyroidism   . Mitral valve regurgitation   . Nocturnal leg cramps   . Peripheral vascular disease (HCC)    followed by Dr Sondra Come  . Rheumatoid arthritis (HCC)   . Shortness of breath    exertional  . UTI (urinary tract infection) 05/2013    Past Surgical History:  Procedure Laterality Date  . ABDOMINAL HYSTERECTOMY    . CAROTID ENDARTERECTOMY Right 05/2013  . CATARACT EXTRACTION, BILATERAL    . CERVICAL FUSION  2012  . COLONOSCOPY W/ POLYPECTOMY  2008  . EYE SURGERY    . FOOT SURGERY Right   . VASCULAR SURGERY      There were no vitals filed for this visit.  Subjective Assessment - 06/30/17 1108    Subjective  Pt. reporting she was able to use TENS device at home with assistance from daughter yesterday.      Pertinent History  AFib, HTN, L4-5 fusion,  cervical sx    Limitations  Standing    How long can you stand comfortably?  10 min     How long can you walk comfortably?  no longer feels pain walking     Diagnostic tests  none recently    Patient Stated Goals  "I want to get rid of the pain"     Currently in Pain?  No/denies    Pain Score  0-No pain    Multiple Pain Sites  No                      [facility] Adult PT Treatment/Exercise - 06/30/17 1114      Lumbar Exercises: Aerobic   Nustep  L5 x 6 min      Lumbar Exercises: Machines for Strengthening   Cybex Knee Flexion  B LE's 15# x 15 reps       Lumbar Exercises: Seated   Sit to Stand  15 reps without UE support     Sit to Stand Limitations  x 7 reps airex pad under R LE, x 8 reps airex pad under L UE      Lumbar Exercises: Supine  Clam  3 seconds;10 reps    Clam Limitations  with red looped TB at knees     Bent Knee Raise  15 reps    Bent Knee Raise Limitations  red looped TB at knees     Bridge with clamshell  15 reps    Bridge with [employer] Limitations  with sustained hip abd/ER isometrics into green TB at knees       Knee/Hip Exercises: Standing   Hip Flexion  Right;Left;Knee bent x 12 reps each     Hip Flexion Limitations  2# at ankles; counter    Hip Abduction  Right;Left;Knee straight x 12 reps each     Abduction Limitations  2# at ankles; counter     Hip Extension  Right;Left;Knee straight x 12 reps each     Extension Limitations  2# at ankles; counter              PT Education - 06/30/17 1303    Education provided  Yes    Education Details  3-way with yellow looped TB issued to pt., counter squat     Person(s) Educated  Patient    Methods  Explanation;Demonstration;Verbal cues;Handout    Comprehension  Verbalized understanding;Returned demonstration;Verbal cues required       PT Short Term Goals - 06/21/17 1112      PT SHORT TERM GOAL #1   Title  patient to be independent with initial HEP    Status  Achieved        PT  Long Term Goals - 06/07/17 1103      PT LONG TERM GOAL #1   Title  patient to be independent with advanced HEP    Status  On-going      PT LONG TERM GOAL #2   Title  patient to improve B hip strength to >/= 4+/5 for improved mobility    Status  On-going      PT LONG TERM GOAL #3   Title  patient to report ability to stand at sink/stove for >/= 30 min without pain limiting    Status  On-going      PT LONG TERM GOAL #4   Title  patient to demonstrate good posture and body mechanics as applicable to daily activities    Status  On-going      PT LONG TERM GOAL #5   Title  patient to report ability to perform ADLs and household chores without pain limiting    Status  On-going            Plan - 06/30/17 1111    Clinical Impression Statement  Pt. reporting she was able to successfully use TENS device at home with assistance from daughter.  Tolerated progression of standing hip/LE strengthening activities well today and reports improved tolerance for standing kitchen work.  HEP updated with standing 3-way kicker with yellow TB resistance and counter squat.  Pt. progressing well toward goals.    PT Treatment/Interventions  ADLs/Self Care Home Management;Cryotherapy;Electrical Stimulation;Moist Heat;Therapeutic exercise;Therapeutic activities;Functional mobility training;Stair training;Gait training;Ultrasound;Balance training;Neuromuscular re-education;Patient/family education;Manual techniques;Vasopneumatic Device;Taping;Dry needling;Passive range of motion    Consulted and Agree with Plan of Care  Patient       Patient will benefit from skilled therapeutic intervention in order to improve the following deficits and impairments:  Pain, Decreased strength, Decreased activity tolerance, Decreased mobility  Visit Diagnosis: Chronic bilateral low back pain with sciatica, sciatica laterality unspecified  Muscle weakness (generalized)  Abnormal posture  Other symptoms and signs  involving the musculoskeletal system     Problem List Patient Active Problem List   Diagnosis Date Noted  . Shoulder tendinitis 10/05/2014  . Postoperative anemia due to acute blood loss 11/23/2013    Class: Acute  . Spinal stenosis, lumbar region, with neurogenic claudication 11/21/2013    Kermit Balo, PTA 06/30/17 1:09 PM  [facility] [address]  [address] [place], [place], [address] Phone: [phone]   Fax:  [phone]  Name: ADINE MACEK MRN: [id number] Date of Birth: [birth year]

## 2017-07-05 ENCOUNTER — Ambulatory Visit: Payer: Medicare Other

## 2017-07-05 DIAGNOSIS — M6281 Muscle weakness (generalized): Secondary | ICD-10-CM

## 2017-07-05 DIAGNOSIS — R293 Abnormal posture: Secondary | ICD-10-CM

## 2017-07-05 DIAGNOSIS — M5440 Lumbago with sciatica, unspecified side: Principal | ICD-10-CM

## 2017-07-05 DIAGNOSIS — G8929 Other chronic pain: Secondary | ICD-10-CM

## 2017-07-05 DIAGNOSIS — R29898 Other symptoms and signs involving the musculoskeletal system: Secondary | ICD-10-CM

## 2017-07-05 NOTE — Therapy (Signed)
[facility] [address]  [place], [place], [address] Phone: [phone]   Fax:  [phone]  Physical Therapy Treatment  Patient Details  Name: Amber Yang MRN: [id number] Date of Birth: 04-01-1931 Referring Provider: Dr. Basil Dess   Encounter Date: 07/05/2017  PT End of Session - 07/05/17 1447    Visit Number  10    Number of Visits  12    Date for PT Re-Evaluation  07/12/17    Authorization Type  Medicare    PT Start Time  1444    PT Stop Time  1528    PT Time Calculation (min)  44 min    Activity Tolerance  Patient tolerated treatment well    Behavior During Therapy  [facility] for tasks assessed/performed       Past Medical History:  Diagnosis Date  . Anxiety   . Asthma   . Constipation   . COPD (chronic obstructive pulmonary disease) ([place])   . COPD, severe ([place])   . Coronary artery disease   . DDD (degenerative disc disease)   . GERD (gastroesophageal reflux disease)   . H/O echocardiogram 01/27/2013  . Hearing loss   . History of colon polyps   . History of nuclear stress test 12/20/2012  . Hyperlipemia, mixed   . Hypertension   . Hypothyroidism   . Mitral valve regurgitation   . Nocturnal leg cramps   . Peripheral vascular disease ([place])    followed by Dr Maryjean Morn  . Rheumatoid arthritis ([place])   . Shortness of breath    exertional  . UTI (urinary tract infection) 05/2013    Past Surgical History:  Procedure Laterality Date  . ABDOMINAL HYSTERECTOMY    . CAROTID ENDARTERECTOMY Right 05/2013  . CATARACT EXTRACTION, BILATERAL    . CERVICAL FUSION  2012  . COLONOSCOPY W/ POLYPECTOMY  2008  . EYE SURGERY    . FOOT SURGERY Right   . VASCULAR SURGERY      There were no vitals filed for this visit.  Subjective Assessment - 07/05/17 1449    Subjective  Pt. reporting no issues with latest HEP.  Pt. reporting 70% improvement in LBP levels since starting therapy.      Pertinent History  AFib, HTN,  L4-5 fusion, cervical sx    Limitations  Standing    How long can you stand comfortably?  20 min  20 min in kitchen at stove or sink    Patient Stated Goals  "I want to get rid of the pain"     Currently in Pain?  No/denies    Pain Score  0-No pain    Multiple Pain Sites  No         [facility] PT Assessment - 07/05/17 1453      Observation/Other Assessments   Focus on Therapeutic Outcomes (FOTO)   61% (39% limitation)       Strength   Strength Assessment Site  Hip;Knee    Right/Left Hip  Right;Left    Right Hip Flexion  4-/5    Right Hip ABduction  4-/5    Left Hip Flexion  4-/5    Left Hip ABduction  4-/5    Right/Left Knee  Right;Left    Right Knee Flexion  4/5    Right Knee Extension  4+/5    Left Knee Flexion  4/5    Left Knee Extension  4+/5  [place] Adult PT Treatment/Exercise - 07/05/17 1521      Lumbar Exercises: Aerobic   Nustep  L5 x 6 min      Lumbar Exercises: Standing   Functional Squats  15 reps    Functional Squats Limitations  Counter - chair behind pt. to encourage pt. wt. shift     Wall Slides  10 reps;3 seconds improved control     Wall Slides Limitations  leaning on orange p-ball     Other Standing Lumbar Exercises  8" forward step up with 1 HH assist x 15 reps each LE Required cueing for LE clearance     Other Standing Lumbar Exercises  3-way hip kicker with 2# at ankles x 15 reps each way Required cueing to abdominal bracing       Lumbar Exercises: Seated   Hip Flexion on Ball  Right;Left;10 reps    Hip Flexion on Ball Limitations  seated on green p-ball  tactile stabilization from therapist to ball                PT Short Term Goals - 06/21/17 1112      PT SHORT TERM GOAL #1   Title  patient to be independent with initial HEP    Status  Achieved        PT Long Term Goals - 07/05/17 1448      PT LONG TERM GOAL #1   Title  patient to be independent with advanced HEP    Status  Partially Met met for current        PT LONG TERM GOAL #2   Title  patient to improve B hip strength to >/= 4+/5 for improved mobility    Status  On-going      PT LONG TERM GOAL #3   Title  patient to report ability to stand at sink/stove for >/= 30 min without pain limiting    Status  On-going 20 min       PT LONG TERM GOAL #4   Title  patient to demonstrate good posture and body mechanics as applicable to daily activities    Status  On-going Still demonstrating poor sitting posture frequently in treatment       PT LONG TERM GOAL #5   Title  patient to report ability to perform ADLs and household chores without pain limiting    Status  Achieved            Plan - 07/05/17 1448    Clinical Impression Statement  Amber Yang doing well today.  Reports she is able to stand in kitchen for twenty minutes before needing to take a break due to pain.  Able to demo improvement in B hip strength today with MMT.  Feels her balance has improved since starting therapy.  Tolerated addition of shallow wall sit today well however does require cueing with most activities for proper pacing and control.  Reports updated HEP going well.  Progressing well toward goals.      PT Treatment/Interventions  ADLs/Self Care Home Management;Cryotherapy;Electrical Stimulation;Moist Heat;Therapeutic exercise;Therapeutic activities;Functional mobility training;Stair training;Gait training;Ultrasound;Balance training;Neuromuscular re-education;Patient/family education;Manual techniques;Vasopneumatic Device;Taping;Dry needling;Passive range of motion    Consulted and Agree with Plan of Care  Patient       Patient will benefit from skilled therapeutic intervention in order to improve the following deficits and impairments:  Pain, Decreased strength, Decreased activity tolerance, Decreased mobility  Visit Diagnosis: Chronic bilateral low back pain with sciatica, sciatica laterality unspecified  Muscle weakness (generalized)  Abnormal posture  Other  symptoms and signs involving the musculoskeletal system     Problem List Patient Active Problem List   Diagnosis Date Noted  . Shoulder tendinitis 10/05/2014  . Postoperative anemia due to acute blood loss 11/23/2013    Class: Acute  . Spinal stenosis, lumbar region, with neurogenic claudication 11/21/2013    Bess Harvest, PTA 07/05/17 6:22 PM  [facility] [address]  [place], [place], [address] Phone: [phone]   Fax:  [phone]  Name: Amber Yang MRN: [id number] Date of Birth: [birth year]

## 2017-07-07 ENCOUNTER — Encounter: Payer: Self-pay | Admitting: Physical Therapy

## 2017-07-07 ENCOUNTER — Ambulatory Visit: Payer: Medicare Other | Admitting: Physical Therapy

## 2017-07-07 DIAGNOSIS — M5440 Lumbago with sciatica, unspecified side: Secondary | ICD-10-CM | POA: Diagnosis not present

## 2017-07-07 DIAGNOSIS — R293 Abnormal posture: Secondary | ICD-10-CM

## 2017-07-07 DIAGNOSIS — R29898 Other symptoms and signs involving the musculoskeletal system: Secondary | ICD-10-CM

## 2017-07-07 DIAGNOSIS — M6281 Muscle weakness (generalized): Secondary | ICD-10-CM

## 2017-07-07 DIAGNOSIS — G8929 Other chronic pain: Secondary | ICD-10-CM

## 2017-07-07 NOTE — Therapy (Signed)
[facility] [address]  [place], [place], [address] Phone: [phone]   Fax:  [phone]  Physical Therapy Treatment  Patient Details  Name: Amber Yang MRN: [id number] Date of Birth: 05/30/1930 Referring Provider: Dr. Basil Dess   Encounter Date: 07/07/2017  PT End of Session - 07/07/17 1109    Visit Number  11    Number of Visits  12    Date for PT Re-Evaluation  07/12/17    Authorization Type  Medicare    PT Start Time  1107    PT Stop Time  1146    PT Time Calculation (min)  39 min    Activity Tolerance  Patient tolerated treatment well    Behavior During Therapy  [facility] for tasks assessed/performed       Past Medical History:  Diagnosis Date  . Anxiety   . Asthma   . Constipation   . COPD (chronic obstructive pulmonary disease) ([place])   . COPD, severe ([place])   . Coronary artery disease   . DDD (degenerative disc disease)   . GERD (gastroesophageal reflux disease)   . H/O echocardiogram 01/27/2013  . Hearing loss   . History of colon polyps   . History of nuclear stress test 12/20/2012  . Hyperlipemia, mixed   . Hypertension   . Hypothyroidism   . Mitral valve regurgitation   . Nocturnal leg cramps   . Peripheral vascular disease ([place])    followed by Dr Maryjean Morn  . Rheumatoid arthritis ([place])   . Shortness of breath    exertional  . UTI (urinary tract infection) 05/2013    Past Surgical History:  Procedure Laterality Date  . ABDOMINAL HYSTERECTOMY    . CAROTID ENDARTERECTOMY Right 05/2013  . CATARACT EXTRACTION, BILATERAL    . CERVICAL FUSION  2012  . COLONOSCOPY W/ POLYPECTOMY  2008  . EYE SURGERY    . FOOT SURGERY Right   . VASCULAR SURGERY      There were no vitals filed for this visit.  Subjective Assessment - 07/07/17 1109    Subjective  feeling well - pleased with progress; able to make bed yesterday with no pain    Pertinent History  AFib, HTN, L4-5 fusion, cervical sx    Patient  Stated Goals  "I want to get rid of the pain"     Currently in Pain?  No/denies                      [facility] Adult PT Treatment/Exercise - 07/07/17 1110      Exercises   Exercises  Lumbar      Lumbar Exercises: Aerobic   Nustep  L4 x 9 min      Lumbar Exercises: Machines for Strengthening   Cybex Knee Extension  B LE's 15# x 15 reps     Cybex Knee Flexion  B LE's 15# x 15 reps       Lumbar Exercises: Standing   Row  Strengthening;Both;15 reps;Theraband    Theraband Level (Row)  Level 3 (Green)    Other Standing Lumbar Exercises  B shoulder extension - green tband x 10 reps    Other Standing Lumbar Exercises  B pallof press - single green tband x 15      Lumbar Exercises: Seated   Other Seated Lumbar Exercises  B HS curls - green tband x 15 reps      Lumbar Exercises: Supine  Other Supine Lumbar Exercises  HS bridge x 15 reps      Lumbar Exercises: Sidelying   Clam  Right;Left;15 reps    Clam Limitations  red looped TB at knees     Hip Abduction  Right;Left;15 reps               PT Short Term Goals - 06/21/17 1112      PT SHORT TERM GOAL #1   Title  patient to be independent with initial HEP    Status  Achieved        PT Long Term Goals - 07/05/17 1448      PT LONG TERM GOAL #1   Title  patient to be independent with advanced HEP    Status  Partially Met met for current       PT LONG TERM GOAL #2   Title  patient to improve B hip strength to >/= 4+/5 for improved mobility    Status  On-going      PT LONG TERM GOAL #3   Title  patient to report ability to stand at sink/stove for >/= 30 min without pain limiting    Status  On-going 20 min       PT LONG TERM GOAL #4   Title  patient to demonstrate good posture and body mechanics as applicable to daily activities    Status  On-going Still demonstrating poor sitting posture frequently in treatment       PT LONG TERM GOAL #5   Title  patient to report ability to perform ADLs and household  chores without pain limiting    Status  Achieved            Plan - 07/07/17 1110    Clinical Impression Statement  [name] doing well today - able to make bed yesterday without back pain. Has made good progress with PT intervention. WIll likely plan to review comprehensive HEP at next visit and plan for discharge.     PT Treatment/Interventions  ADLs/Self Care Home Management;Cryotherapy;Electrical Stimulation;Moist Heat;Therapeutic exercise;Therapeutic activities;Functional mobility training;Stair training;Gait training;Ultrasound;Balance training;Neuromuscular re-education;Patient/family education;Manual techniques;Vasopneumatic Device;Taping;Dry needling;Passive range of motion    Consulted and Agree with Plan of Care  Patient       Patient will benefit from skilled therapeutic intervention in order to improve the following deficits and impairments:  Pain, Decreased strength, Decreased activity tolerance, Decreased mobility  Visit Diagnosis: Chronic bilateral low back pain with sciatica, sciatica laterality unspecified  Muscle weakness (generalized)  Abnormal posture  Other symptoms and signs involving the musculoskeletal system     Problem List Patient Active Problem List   Diagnosis Date Noted  . Shoulder tendinitis 10/05/2014  . Postoperative anemia due to acute blood loss 11/23/2013    Class: Acute  . Spinal stenosis, lumbar region, with neurogenic claudication 11/21/2013     Lanney Gins, PT, DPT 07/07/17 11:55 AM   [facility] [address]  [place], [place], [address] Phone: [phone]   Fax:  [phone]  Name: Amber Yang MRN: [id number] Date of Birth: Jun 30, 1930

## 2017-07-12 ENCOUNTER — Ambulatory Visit: Payer: Medicare Other | Admitting: Physical Therapy

## 2017-07-12 ENCOUNTER — Encounter: Payer: Self-pay | Admitting: Physical Therapy

## 2017-07-12 DIAGNOSIS — M6281 Muscle weakness (generalized): Secondary | ICD-10-CM

## 2017-07-12 DIAGNOSIS — G8929 Other chronic pain: Secondary | ICD-10-CM

## 2017-07-12 DIAGNOSIS — M5440 Lumbago with sciatica, unspecified side: Principal | ICD-10-CM

## 2017-07-12 DIAGNOSIS — R293 Abnormal posture: Secondary | ICD-10-CM

## 2017-07-12 DIAGNOSIS — R29898 Other symptoms and signs involving the musculoskeletal system: Secondary | ICD-10-CM

## 2017-07-12 NOTE — Therapy (Signed)
[facility] [address]  [place], [place], [address] Phone: [phone]   Fax:  [phone]  Physical Therapy Treatment  Patient Details  Name: Amber Yang MRN: [id number] Date of Birth: [birth year] Referring Provider: Dr. Sherrie Sport   Encounter Date: 07/12/2017  PT End of Session - 07/12/17 1110    Visit Number  12    Number of Visits  12    Date for PT Re-Evaluation  07/12/17    Authorization Type  Medicare    PT Start Time  1107    PT Stop Time  1145    PT Time Calculation (min)  38 min    Activity Tolerance  Patient tolerated treatment well    Behavior During Therapy  [facility] for tasks assessed/performed       Past Medical History:  Diagnosis Date  . Anxiety   . Asthma   . Constipation   . COPD (chronic obstructive pulmonary disease) ([place])   . COPD, severe ([place])   . Coronary artery disease   . DDD (degenerative disc disease)   . GERD (gastroesophageal reflux disease)   . H/O echocardiogram 01/27/2013  . Hearing loss   . History of colon polyps   . History of nuclear stress test 12/20/2012  . Hyperlipemia, mixed   . Hypertension   . Hypothyroidism   . Mitral valve regurgitation   . Nocturnal leg cramps   . Peripheral vascular disease ([place])    followed by Dr Maryjean Morn  . Rheumatoid arthritis ([place])   . Shortness of breath    exertional  . UTI (urinary tract infection) 05/2013    Past Surgical History:  Procedure Laterality Date  . ABDOMINAL HYSTERECTOMY    . CAROTID ENDARTERECTOMY Right 05/2013  . CATARACT EXTRACTION, BILATERAL    . CERVICAL FUSION  2012  . COLONOSCOPY W/ POLYPECTOMY  2008  . EYE SURGERY    . FOOT SURGERY Right   . VASCULAR SURGERY      There were no vitals filed for this visit.  Subjective Assessment - 07/12/17 1109    Subjective  doing well - pleased with her progress    Pertinent History  AFib, HTN, L4-5 fusion, cervical sx    Patient Stated Goals  "I want to get rid of the  pain"     Currently in Pain?  No/denies    Pain Score  0-No pain         [facility] PT Assessment - 07/12/17 0001      Assessment   Medical Diagnosis  Spinal Stenosis of lumbar region with neurologic claudication    Referring Provider  Dr. Sherrie Sport    Next MD Visit  08/02/17      Strength   Strength Assessment Site  Hip;Knee    Right/Left Hip  Right;Left    Right Hip Flexion  4/5    Right Hip ABduction  4+/5    Left Hip Flexion  4/5    Left Hip ABduction  4+/5    Right/Left Knee  Right;Left    Right Knee Flexion  4+/5    Right Knee Extension  4+/5    Left Knee Flexion  4+/5    Left Knee Extension  4+/5                  [facility] Adult PT Treatment/Exercise - 07/12/17 1111      Lumbar Exercises: Aerobic   Nustep  L5 x 6 min  Lumbar Exercises: Standing   Functional Squats  15 reps    Functional Squats Limitations  at counter    Other Standing Lumbar Exercises  B side stepping - red tband x 20 feet each direction      Lumbar Exercises: Seated   Sit to Stand  10 reps on [employer]      Lumbar Exercises: Supine   Bridge  15 reps    Straight Leg Raise  15 reps bilateral      Lumbar Exercises: Sidelying   Hip Abduction  Right;Left;15 reps               PT Short Term Goals - 06/21/17 1112      PT SHORT TERM GOAL #1   Title  patient to be independent with initial HEP    Status  Achieved        PT Long Term Goals - 07/12/17 1110      PT LONG TERM GOAL #1   Title  patient to be independent with advanced HEP    Status  Achieved      PT LONG TERM GOAL #2   Title  patient to improve B hip strength to >/= 4+/5 for improved mobility    Status  Partially Met      PT LONG TERM GOAL #3   Title  patient to report ability to stand at sink/stove for >/= 30 min without pain limiting    Status  Achieved      PT LONG TERM GOAL #4   Title  patient to demonstrate good posture and body mechanics as applicable to daily activities    Status  Achieved      PT  LONG TERM GOAL #5   Title  patient to report ability to perform ADLs and household chores without pain limiting    Status  Achieved            Plan - 07/12/17 1110    Clinical Impression Statement  Ms. Cerniglia has made excellent progress with skilled PT. Patient reporting little to no pain with ADLs and household chores - which was her primary complaint at initial evaluation. Patient meeting all established goals with great improvements in strength and general functional mobility. Will plan to transition to HEP today and d/c from PT. Will gladly see patient in the future for any other concerns.     PT Treatment/Interventions  ADLs/Self Care Home Management;Cryotherapy;Electrical Stimulation;Moist Heat;Therapeutic exercise;Therapeutic activities;Functional mobility training;Stair training;Gait training;Ultrasound;Balance training;Neuromuscular re-education;Patient/family education;Manual techniques;Vasopneumatic Device;Taping;Dry needling;Passive range of motion    Consulted and Agree with Plan of Care  Patient       Patient will benefit from skilled therapeutic intervention in order to improve the following deficits and impairments:  Pain, Decreased strength, Decreased activity tolerance, Decreased mobility  Visit Diagnosis: Chronic bilateral low back pain with sciatica, sciatica laterality unspecified  Muscle weakness (generalized)  Abnormal posture  Other symptoms and signs involving the musculoskeletal system     Problem List Patient Active Problem List   Diagnosis Date Noted  . Shoulder tendinitis 10/05/2014  . Postoperative anemia due to acute blood loss 11/23/2013    Class: Acute  . Spinal stenosis, lumbar region, with neurogenic claudication 11/21/2013     Lanney Gins, PT, DPT 07/12/17 1:00 PM   [facility] [address]  [place], [place], [address] Phone: [phone]   Fax:   [phone]  Name: Amber Yang MRN: [id number] Date of  Birth: August 04, 1930

## 2017-08-02 ENCOUNTER — Encounter (INDEPENDENT_AMBULATORY_CARE_PROVIDER_SITE_OTHER): Payer: Self-pay | Admitting: Specialist

## 2017-08-02 ENCOUNTER — Ambulatory Visit (INDEPENDENT_AMBULATORY_CARE_PROVIDER_SITE_OTHER): Payer: Medicare Other | Admitting: Specialist

## 2017-08-02 VITALS — BP 134/59 | HR 63 | Ht 61.0 in | Wt 146.0 lb

## 2017-08-02 DIAGNOSIS — Z981 Arthrodesis status: Secondary | ICD-10-CM | POA: Diagnosis not present

## 2017-08-02 NOTE — Progress Notes (Signed)
Office Visit Note   Patient: Amber Yang           Date of Birth: Nov 23, 1930           MRN: [id number] Visit Date: 08/02/2017              Requested by: Amelia Jo, FNP [address] [place], [place] [address] PCP: Amelia Jo, FNP   Assessment & Plan: Visit Diagnoses:  1. Status post lumbar spinal fusion     Plan:Avoid bending, stooping and avoid lifting weights greater than 10 lbs. Avoid prolong standing and walking. Avoid frequent bending and stooping  No lifting greater than 10 lbs. May use ice or moist heat for pain. Weight loss is of benefit. Handicap license is approved  Follow-Up Instructions: Return if symptoms worsen or fail to improve.   Orders:  No orders of the defined types were placed in this encounter.  No orders of the defined types were placed in this encounter.     Procedures: No procedures performed   Clinical Data: No additional findings.   Subjective: Chief Complaint  Patient presents with  . Lower Back - Follow-up    84 year old female with history of lumbar decompression and fusion for severe spinal stenosis and spondylosis. She has left hand tremor and a previous left frozen shoulder. She does well with PT and post PT even the tremor is better and she is able to stand and walk better. She went to [facility] at [facility].    Review of Systems  Constitutional: Negative.   HENT: Negative.   Eyes: Negative.   Respiratory: Negative.   Cardiovascular: Negative.   Gastrointestinal: Negative.   Endocrine: Negative.   Genitourinary: Negative.   Musculoskeletal: Negative.   Skin: Negative.   Allergic/Immunologic: Negative.   Neurological: Negative.   Hematological: Negative.   Psychiatric/Behavioral: Negative.      Objective: Vital Signs: BP (!) 134/59 (BP Location: Left Arm, Patient Position: Sitting)   Pulse 63   Ht 5\' 1"  (1.549 m)   Wt 146 lb (66.2 kg)   BMI 27.59 kg/m   Physical  Exam  Constitutional: She is oriented to person, place, and time. She appears well-developed and well-nourished.  HENT:  Head: Normocephalic and atraumatic.  Eyes: Pupils are equal, round, and reactive to light. EOM are normal.  Neck: Normal range of motion. Neck supple.  Pulmonary/Chest: Effort normal and breath sounds normal.  Abdominal: Soft. Bowel sounds are normal.  Neurological: She is alert and oriented to person, place, and time.  Skin: Skin is warm and dry.  Psychiatric: She has a normal mood and affect. Her behavior is normal. Judgment and thought content normal.    Back Exam   Tenderness  The patient is experiencing tenderness in the lumbar.  Range of Motion  Extension: abnormal  Flexion: abnormal  Lateral bend right: abnormal  Lateral bend left: abnormal  Rotation right: abnormal  Rotation left: abnormal   Muscle Strength  Right Quadriceps:  5/5  Left Quadriceps:  5/5  Right Hamstrings:  5/5  Left Hamstrings:  5/5   Tests  Straight leg raise right: negative Straight leg raise left: negative  Reflexes  Patellar: normal Achilles: normal Biceps: normal Babinski's sign: normal   Other  Toe walk: normal Heel walk: normal Sensation: normal Gait: normal  Erythema: no back redness Scars: present      Specialty Comments:  No specialty comments available.  Imaging: No results  found.   PMFS History: Patient Active Problem List   Diagnosis Date Noted  . Postoperative anemia due to acute blood loss 11/23/2013    Priority: Low    Class: Acute  . Shoulder tendinitis 10/05/2014  . Spinal stenosis, lumbar region, with neurogenic claudication 11/21/2013   Past Medical History:  Diagnosis Date  . Anxiety   . Asthma   . Constipation   . COPD (chronic obstructive pulmonary disease) (HCC)   . COPD, severe (HCC)   . Coronary artery disease   . DDD (degenerative disc disease)   . GERD (gastroesophageal reflux disease)   . H/O echocardiogram  01/27/2013  . Hearing loss   . History of colon polyps   . History of nuclear stress test 12/20/2012  . Hyperlipemia, mixed   . Hypertension   . Hypothyroidism   . Mitral valve regurgitation   . Nocturnal leg cramps   . Peripheral vascular disease (HCC)    followed by Dr Sondra Come  . Rheumatoid arthritis (HCC)   . Shortness of breath    exertional  . UTI (urinary tract infection) 05/2013    History reviewed. No pertinent family history.  Past Surgical History:  Procedure Laterality Date  . ABDOMINAL HYSTERECTOMY    . CAROTID ENDARTERECTOMY Right 05/2013  . CATARACT EXTRACTION, BILATERAL    . CERVICAL FUSION  2012  . COLONOSCOPY W/ POLYPECTOMY  2008  . EYE SURGERY    . FOOT SURGERY Right   . VASCULAR SURGERY     Social History   Occupational History  . Not on file  Tobacco Use  . Smoking status: Former Smoker    Packs/day: 1.00    Years: 40.00    Pack years: 40.00    Types: Cigarettes    Last attempt to quit: 11/15/2003    Years since quitting: 13.7  . Smokeless tobacco: Never Used  Substance and Sexual Activity  . Alcohol use: No  . Drug use: No  . Sexual activity: Never

## 2017-08-02 NOTE — Patient Instructions (Signed)
Avoid bending, stooping and avoid lifting weights greater than 10 lbs. Avoid prolong standing and walking. Avoid frequent bending and stooping  No lifting greater than 10 lbs. May use ice or moist heat for pain. Weight loss is of benefit. Handicap license is approved.  

## 2017-09-28 ENCOUNTER — Telehealth (INDEPENDENT_AMBULATORY_CARE_PROVIDER_SITE_OTHER): Payer: Self-pay | Admitting: Specialist

## 2017-09-28 NOTE — Telephone Encounter (Signed)
Patient would like to be put on the cancellation list in case there is an opening earlier than July 15th. CB # [id number]

## 2017-09-29 NOTE — Telephone Encounter (Signed)
I put her on the cancellation list 

## 2017-10-04 ENCOUNTER — Ambulatory Visit (INDEPENDENT_AMBULATORY_CARE_PROVIDER_SITE_OTHER): Payer: Medicare Other

## 2017-10-04 ENCOUNTER — Ambulatory Visit (INDEPENDENT_AMBULATORY_CARE_PROVIDER_SITE_OTHER): Payer: Medicare Other | Admitting: Specialist

## 2017-10-04 ENCOUNTER — Encounter (INDEPENDENT_AMBULATORY_CARE_PROVIDER_SITE_OTHER): Payer: Self-pay | Admitting: Specialist

## 2017-10-04 VITALS — BP 132/68 | HR 64 | Ht 61.0 in | Wt 133.0 lb

## 2017-10-04 DIAGNOSIS — M47812 Spondylosis without myelopathy or radiculopathy, cervical region: Secondary | ICD-10-CM | POA: Diagnosis not present

## 2017-10-04 DIAGNOSIS — R2689 Other abnormalities of gait and mobility: Secondary | ICD-10-CM

## 2017-10-04 DIAGNOSIS — M4322 Fusion of spine, cervical region: Secondary | ICD-10-CM | POA: Diagnosis not present

## 2017-10-04 DIAGNOSIS — M5481 Occipital neuralgia: Secondary | ICD-10-CM

## 2017-10-04 DIAGNOSIS — M542 Cervicalgia: Secondary | ICD-10-CM

## 2017-10-04 MED ORDER — BACLOFEN 10 MG PO TABS: 10.0000 mg | ORAL_TABLET | Freq: Two times a day (BID) | ORAL | 0 refills | Status: DC

## 2017-10-04 MED ORDER — PREDNISONE 5 MG PO TABS: ORAL_TABLET | ORAL | 0 refills | Status: AC

## 2017-10-04 NOTE — Patient Instructions (Addendum)
Avoid overhead lifting and overhead use of the arms. Do not lift greater than 5 lbs. Adjust head rest in vehicle to prevent hyperextension if rear ended. Take extra precautions to avoid falling, including use of a cane if you feel weak. Start PT at [facility] on [address] for balance and coordination and for cervical Spondylosis. Baclofen for muscle spasm. Prednisone low dose 5mg  daily for 2 weeks then 1/2 tablet for 2 weeks. Handicap car sticker approved.

## 2017-10-04 NOTE — Progress Notes (Signed)
Office Visit Note   Patient: Amber Yang           Date of Birth: Feb 17, 1931           MRN: [id number] Visit Date: 10/04/2017              Requested by: Amelia Jo, FNP [address] [place], [place] [address] PCP: Amelia Jo, FNP   Assessment & Plan: Visit Diagnoses:  1. Neck pain   2. Cervicalgia   3. Fusion of spine of cervical region   4. Cervico-occipital neuralgia   5. Spondylosis without myelopathy or radiculopathy, cervical region   6. Balance problem     Plan: Avoid overhead lifting and overhead use of the arms. Do not lift greater than 5 lbs. Adjust head rest in vehicle to prevent hyperextension if rear ended. Take extra precautions to avoid falling, including use of a cane if you feel weak. Start PT at [facility] on [address] for balance and coordination and for cervical Spondylosis Baclofen for muscle spasm. Prednisone low dose 5mg  daily for 2 weeks then 1/2 tablet for 2 weeks.  Follow-Up Instructions: Return in about 1 month (around 11/01/2017).   Orders:  Orders Placed This Encounter  Procedures  . XR Cervical Spine 2 or 3 views  . Ambulatory referral to Physical Therapy   Meds ordered this encounter  Medications  . baclofen (LIORESAL) 10 MG tablet    Sig: Take 1 tablet (10 mg total) by mouth 2 (two) times daily.    Dispense:  30 each    Refill:  0  . predniSONE (DELTASONE) 5 MG tablet    Sig: Take 1 tablet (5 mg total) by mouth daily with breakfast for 14 days, THEN 0.5 tablets (2.5 mg total) daily with breakfast for 14 days.    Dispense:  21 tablet    Refill:  0      Procedures: No procedures performed   Clinical Data: No additional findings.   Subjective: Chief Complaint  Patient presents with  . Neck - Pain    84 year old right handed female with one month history of right neck and right shoulder pain with associated right head ache. And right occipital parietal head ach. No arm pain numbness or tingling. Mainly  right occipital pain and right neck pain. She has a history of C3 to C6 fusion with radiographic signs of possible  Congenital fusion C2-3. No bowel or bladder difficulty. Daughter notes that she is off balance with standing and walking and at risk of a fall. She is on eloquis for  Atrial fibrillation.    Review of Systems  Constitutional: Positive for appetite change and unexpected weight change. Negative for activity change, chills, diaphoresis, fatigue and fever.  HENT: Positive for hearing loss, sinus pressure, sinus pain and sneezing. Negative for congestion, dental problem, drooling, ear discharge, ear pain, facial swelling, mouth sores, nosebleeds, postnasal drip, rhinorrhea, sore throat, trouble swallowing and voice change.   Eyes: Negative.  Negative for photophobia, pain, discharge, itching and visual disturbance.  Respiratory: Positive for shortness of breath and wheezing. Negative for apnea, cough, choking, chest tightness and stridor.   Cardiovascular: Negative.  Negative for chest pain, palpitations and leg swelling.  Gastrointestinal: Negative.  Negative for abdominal distention, abdominal pain, anal bleeding, blood in stool, constipation, diarrhea, nausea, rectal pain and vomiting.  Endocrine: Positive for cold intolerance.  Genitourinary: Negative.   Musculoskeletal: Negative.  Negative for neck pain and neck  stiffness.  Skin: Negative.   Allergic/Immunologic: Negative.   Neurological: Negative.   Hematological: Negative.   Psychiatric/Behavioral: Negative.      Objective: Vital Signs: BP 132/68 (BP Location: Left Arm, Patient Position: Sitting, Cuff Size: Normal)   Pulse 64   Ht 5\' 1"  (1.549 m)   Wt 133 lb (60.3 kg)   BMI 25.13 kg/m   Physical Exam  Constitutional: She is oriented to person, place, and time. She appears well-developed and well-nourished.  HENT:  Head: Normocephalic and atraumatic.  Eyes: Pupils are equal, round, and reactive to light. EOM are  normal.  Neck: Normal range of motion. Neck supple.  Pulmonary/Chest: Effort normal and breath sounds normal.  Abdominal: Soft. Bowel sounds are normal.  Musculoskeletal: Normal range of motion.  Neurological: She is alert and oriented to person, place, and time.  Skin: Skin is warm and dry.  Psychiatric: She has a normal mood and affect. Her behavior is normal. Judgment and thought content normal.    Ortho Exam  Specialty Comments:  No specialty comments available.  Imaging: Xr Cervical Spine 2 Or 3 Views  Result Date: 10/04/2017 AP and lateral Cervical spine shows anterior cervical fusion C3 to C6 with retained anterior cervical plate and screws in good position and alignment, no sign of loosening, fusion appear solid. Disc height C2-3 is well maintained. Spondylosis Changes C6-7 with facet arthrosis.  Alignment of the spine is normal, no ST swelling noted.    PMFS History: Patient Active Problem List   Diagnosis Date Noted  . Postoperative anemia due to acute blood loss 11/23/2013    Priority: Low    Class: Acute  . Shoulder tendinitis 10/05/2014  . Spinal stenosis, lumbar region, with neurogenic claudication 11/21/2013   Past Medical History:  Diagnosis Date  . Anxiety   . Asthma   . Constipation   . COPD (chronic obstructive pulmonary disease) (HCC)   . COPD, severe (HCC)   . Coronary artery disease   . DDD (degenerative disc disease)   . GERD (gastroesophageal reflux disease)   . H/O echocardiogram 01/27/2013  . Hearing loss   . History of colon polyps   . History of nuclear stress test 12/20/2012  . Hyperlipemia, mixed   . Hypertension   . Hypothyroidism   . Mitral valve regurgitation   . Nocturnal leg cramps   . Peripheral vascular disease (HCC)    followed by Dr 12/22/2012  . Rheumatoid arthritis (HCC)   . Shortness of breath    exertional  . UTI (urinary tract infection) 05/2013    History reviewed. No pertinent family history.  Past Surgical History:    Procedure Laterality Date  . ABDOMINAL HYSTERECTOMY    . CAROTID ENDARTERECTOMY Right 05/2013  . CATARACT EXTRACTION, BILATERAL    . CERVICAL FUSION  2012  . COLONOSCOPY W/ POLYPECTOMY  2008  . EYE SURGERY    . FOOT SURGERY Right   . VASCULAR SURGERY     Social History   Occupational History  . Not on file  Tobacco Use  . Smoking status: Former Smoker    Packs/day: 1.00    Years: 40.00    Pack years: 40.00    Types: Cigarettes    Last attempt to quit: 11/15/2003    Years since quitting: 13.8  . Smokeless tobacco: Never Used  Substance and Sexual Activity  . Alcohol use: No  . Drug use: No  . Sexual activity: Never

## 2017-10-12 ENCOUNTER — Encounter: Payer: Self-pay | Admitting: Physical Therapy

## 2017-10-12 ENCOUNTER — Ambulatory Visit: Payer: Medicare Other | Attending: Specialist | Admitting: Physical Therapy

## 2017-10-12 ENCOUNTER — Other Ambulatory Visit: Payer: Self-pay

## 2017-10-12 DIAGNOSIS — M542 Cervicalgia: Secondary | ICD-10-CM

## 2017-10-12 DIAGNOSIS — R293 Abnormal posture: Secondary | ICD-10-CM | POA: Insufficient documentation

## 2017-10-12 DIAGNOSIS — R2681 Unsteadiness on feet: Secondary | ICD-10-CM

## 2017-10-12 DIAGNOSIS — M6281 Muscle weakness (generalized): Secondary | ICD-10-CM | POA: Diagnosis present

## 2017-10-12 NOTE — Therapy (Addendum)
[facility] [address]  [address] [place], [place], [address] Phone: [phone]   Fax:  [phone]  Physical Therapy Evaluation  Patient Details  Name: Amber Yang MRN: [id number] Date of Birth: 03-27-1931 Referring Provider: Vira Browns, MD   Encounter Date: 10/12/2017  PT End of Session - 10/12/17 1401    Visit Number  1    Number of Visits  9    Date for PT Re-Evaluation  11/09/17    Authorization Type  Medicare    PT Start Time  0108    PT Stop Time  0157    PT Time Calculation (min)  49 min    Equipment Utilized During Treatment  Gait belt    Activity Tolerance  Patient tolerated treatment well;No increased pain    Behavior During Therapy  WFL for tasks assessed/performed       Past Medical History:  Diagnosis Date  . Anxiety   . Asthma   . Constipation   . COPD (chronic obstructive pulmonary disease) (HCC)   . COPD, severe (HCC)   . Coronary artery disease   . DDD (degenerative disc disease)   . GERD (gastroesophageal reflux disease)   . H/O echocardiogram 01/27/2013  . Hearing loss   . History of colon polyps   . History of nuclear stress test 12/20/2012  . Hyperlipemia, mixed   . Hypertension   . Hypothyroidism   . Mitral valve regurgitation   . Nocturnal leg cramps   . Peripheral vascular disease (HCC)    followed by Dr Sondra Come  . Rheumatoid arthritis (HCC)   . Shortness of breath    exertional  . UTI (urinary tract infection) 05/2013    Past Surgical History:  Procedure Laterality Date  . ABDOMINAL HYSTERECTOMY    . CAROTID ENDARTERECTOMY Right 05/2013  . CATARACT EXTRACTION, BILATERAL    . CERVICAL FUSION  2012  . COLONOSCOPY W/ POLYPECTOMY  2008  . EYE SURGERY    . FOOT SURGERY Right   . VASCULAR SURGERY      There were no vitals filed for this visit.   Subjective Assessment - 10/12/17 1318    Subjective  Pt reports that she is seeking therapy for pain in her back pain and that  certain activities like bending over really flare up her pain. Reports that wearing "the belt" that she has from her lower back surgery helps to keep her upright and alleviate her pain. Pt reports that her balance has been worsening as of recently but that she is still able to perform the activities that she needs to around her home but with increased difficulty. Pt states that she does not walk well and it takes her longer to get around.     Patient is accompained by:  --    Pertinent History  Hx of spinal surgeries    Limitations  Standing;Walking;House hold activities    How long can you stand comfortably?  10-15 minutes    Patient Stated Goals  decrease pain, improve walking, improve balance     Currently in Pain?  No/denies    Pain Location  Back    Pain Orientation  Right    Pain Descriptors / Indicators  Sore;Sharp    Pain Type  Chronic pain    Pain Onset  More than a month ago    Pain Frequency  Intermittent    Aggravating Factors   bending over, standing for long periods of time.  Pain Relieving Factors  Heat, Estim    Effect of Pain on Daily Activities  Can't stand and cook for long, washing dishes and doing the laundry is difficult    Multiple Pain Sites  Yes    Pain Location  Neck    Pain Orientation  Right    Pain Descriptors / Indicators  Headache    Pain Radiating Towards  Pt states that the pain travels up from her neck into her head    Pain Onset  1 to 4 weeks ago    Pain Frequency  Intermittent    Pain Relieving Factors  Aspercreme         [facility] PT Assessment - 10/12/17 0001      Assessment   Medical Diagnosis  Neck pain, Cervicalgia, Balance problem,     Referring Provider  Vira Browns, MD    Onset Date/Surgical Date  07/12/17    Next MD Visit  11/01/2017    Prior Therapy  Yes      Balance Screen   Has the patient fallen in the past 6 months  Yes    How many times?  1    Has the patient had a decrease in activity level because of a fear of falling?   No     Is the patient reluctant to leave their home because of a fear of falling?   No      Home Environment   Living Environment  Private residence    Living Arrangements  Alone    Type of Home  Mobile home    Home Access  Stairs to enter    Entrance Stairs-Number of Steps  4    Home Equipment  [place] - single point;Walker - 2 wheels;Grab bars - toilet      Prior Function   Level of Independence  Independent with basic ADLs    Vocation  Retired      AROM   Cervical Flexion  50% limited    Cervical Extension  50% limited    Cervical - Right Rotation  50% limited    Cervical - Left Rotation  50% limited      Strength   Right/Left Shoulder  Right;Left    Right Shoulder Flexion  2+/5    Right Shoulder ABduction  2+/5    Left Shoulder Flexion  2+/5    Left Shoulder ABduction  2+/5      other    Findings  Negative    Side  Right    Comment  Hoffman's      Ambulation/Gait   Ambulation/Gait  Yes    Gait Pattern  Step-to pattern;Decreased arm swing - left;Decreased step length - left;Decreased step length - right;Decreased hip/knee flexion - right;Decreased hip/knee flexion - left;Right foot flat;Left foot flat;Trunk flexed;Poor foot clearance - left;Poor foot clearance - right;Decreased trunk rotation    Ambulation Surface  Indoor;Level      Balance   Balance Assessed  Yes Modified CTSIB    Balance comment  Modified CTSIB; Condition 1: eyes open, level ground - 30 seconds; condition 2: eyes closed, solid ground - 30 seconds; condition 3: eyes open, airex pad - 30 seconds; Condition 4: eyes closed, airex pad - 14.62 s, 6.08 s, 15.16 s      Standardized Balance Assessment   Standardized Balance Assessment  Timed Up and Go Test;Five Times Sit to Stand;10 meter walk test    Five times sit to stand comments   22.06 seconds  10 Meter Walk  13.47 seconds      Timed Up and Go Test   Normal TUG (seconds)  14.09    Manual TUG (seconds)  16.59    Cognitive TUG (seconds)  20.04                 Objective measurements completed on examination: See above findings.      [facility] Adult PT Treatment/Exercise - 10/12/17 0001      Ambulation/Gait   Gait velocity  2.44 ft/s                PT Short Term Goals - 10/12/17 1606      PT SHORT TERM GOAL #1   Title  Pt will be independent with HEP    Status  New    Target Date  10/26/17        PT Long Term Goals - 10/12/17 1607      PT LONG TERM GOAL #1   Title  Pt will decrease normal TUG time by > 2 seconds to decrease risk for falls    Status  New    Target Date  11/09/17      PT LONG TERM GOAL #2   Title  Pt to improve gait speed to 3.0 ft/s to improve ability for home ambulation     Status  New    Target Date  11/09/17      PT LONG TERM GOAL #3   Title  Pt will decrease 5xSTS time by > 4 seconds to improve ability to perform household activities for longer periods of time    Status  New      PT LONG TERM GOAL #4   Title  Pt will report being able to stand while preparing meals for >/= 30 min without increased pain    Status  New    Target Date  11/09/17             Plan - 10/12/17 1410    Clinical Impression Statement  Pt is an 84 y/o F who presents to OP PT with c/c of worsening balance and cervical neck pain that keep her from being able to perform activities at home without having to stop every few minutes and sit down. Pt also complains of low back pain that was treated during a previous episode of therapy. PT examination revealed decreased UE strength, gait abnormalities and decreased gait speed, and balance deficits, which impact her ability to perform activities around her home safely. Results of her exam indicate that pt is at a high risk for falls. Pt's prognosis is positively impacted by her familial support system and previous positive response to physical therapy, and is negatively impacted by her extensive medical history.    History and Personal Factors relevant to plan of  care:  Pt has had previous surgery to the lumbar and cervical spine    Clinical Presentation  Stable    Clinical Decision Making  Low    Rehab Potential  Fair    Clinical Impairments Affecting Rehab Potential  Chronicity of other musculoskeletal pain areas    PT Frequency  2x / week    PT Duration  4 weeks    PT Treatment/Interventions  ADLs/Self Care Home Management;Cryotherapy;Electrical Stimulation;Iontophoresis 4mg /ml Dexamethasone;Moist Heat;Traction;Gait training;Stair training;Functional mobility training;Therapeutic activities;Therapeutic exercise;Patient/family education;Neuromuscular re-education;Balance training;Manual techniques;DME Instruction;Vasopneumatic Device    PT Next Visit Plan  Further inquire about neck pain; alleviating and aggrevating activities; begin balance activities to challenge limits  of stability      Consulted and Agree with Plan of Care  Patient       Patient will benefit from skilled therapeutic intervention in order to improve the following deficits and impairments:  Abnormal gait, Decreased activity tolerance, Decreased balance, Decreased knowledge of use of DME, Decreased safety awareness, Decreased range of motion, Decreased skin integrity, Decreased strength, Difficulty walking, Postural dysfunction, Improper body mechanics, Pain  Visit Diagnosis: Unsteadiness on feet  Cervicalgia  Muscle weakness (generalized)  Abnormal posture     Problem List Patient Active Problem List   Diagnosis Date Noted  . Shoulder tendinitis 10/05/2014  . Postoperative anemia due to acute blood loss 11/23/2013    Class: Acute  . Spinal stenosis, lumbar region, with neurogenic claudication 11/21/2013    Mikal Plane, SPT 10/12/2017, 7:01 PM  [facility] [address]  [address] [place], [place], [address] Phone: [phone]   Fax:  [phone]  Name: Amber Yang MRN: [id number] Date of Birth: Nov 08, 1930

## 2017-10-18 ENCOUNTER — Other Ambulatory Visit (INDEPENDENT_AMBULATORY_CARE_PROVIDER_SITE_OTHER): Payer: Self-pay | Admitting: Specialist

## 2017-10-19 NOTE — Telephone Encounter (Signed)
Baclofen refill request 

## 2017-10-19 NOTE — Addendum Note (Signed)
Addended by: Marry Guan on: 10/19/2017 08:29 AM   Modules accepted: Orders

## 2017-10-25 ENCOUNTER — Encounter: Payer: Self-pay | Admitting: Physical Therapy

## 2017-10-25 ENCOUNTER — Ambulatory Visit: Payer: Medicare Other | Attending: Specialist | Admitting: Physical Therapy

## 2017-10-25 DIAGNOSIS — M542 Cervicalgia: Secondary | ICD-10-CM | POA: Insufficient documentation

## 2017-10-25 DIAGNOSIS — R2681 Unsteadiness on feet: Secondary | ICD-10-CM | POA: Insufficient documentation

## 2017-10-25 DIAGNOSIS — M6281 Muscle weakness (generalized): Secondary | ICD-10-CM

## 2017-10-25 DIAGNOSIS — R293 Abnormal posture: Secondary | ICD-10-CM | POA: Insufficient documentation

## 2017-10-25 NOTE — Therapy (Signed)
[facility] [address]  [address] [place], [place], [address] Phone: [phone]   Fax:  [phone]  Physical Therapy Treatment  Patient Details  Name: Amber Yang MRN: [id number] Date of Birth: Aug 27, 1930 Referring Provider: Vira Browns, MD   Encounter Date: 10/25/2017  PT End of Session - 10/25/17 1450    Visit Number  2    Number of Visits  9    Date for PT Re-Evaluation  11/09/17    Authorization Type  Medicare    PT Start Time  1450    PT Stop Time  1534    PT Time Calculation (min)  44 min    Equipment Utilized During Treatment  Gait belt    Activity Tolerance  Patient tolerated treatment well;No increased pain    Behavior During Therapy  WFL for tasks assessed/performed       Past Medical History:  Diagnosis Date  . Anxiety   . Asthma   . Constipation   . COPD (chronic obstructive pulmonary disease) (HCC)   . COPD, severe (HCC)   . Coronary artery disease   . DDD (degenerative disc disease)   . GERD (gastroesophageal reflux disease)   . H/O echocardiogram 01/27/2013  . Hearing loss   . History of colon polyps   . History of nuclear stress test 12/20/2012  . Hyperlipemia, mixed   . Hypertension   . Hypothyroidism   . Mitral valve regurgitation   . Nocturnal leg cramps   . Peripheral vascular disease (HCC)    followed by Dr Sondra Come  . Rheumatoid arthritis (HCC)   . Shortness of breath    exertional  . UTI (urinary tract infection) 05/2013    Past Surgical History:  Procedure Laterality Date  . ABDOMINAL HYSTERECTOMY    . CAROTID ENDARTERECTOMY Right 05/2013  . CATARACT EXTRACTION, BILATERAL    . CERVICAL FUSION  2012  . COLONOSCOPY W/ POLYPECTOMY  2008  . EYE SURGERY    . FOOT SURGERY Right   . VASCULAR SURGERY      There were no vitals filed for this visit.  Subjective Assessment - 10/25/17 1455    Subjective  Pt reports the high temperatures are wearing her out. Also noting very easily  brusing due to Eloquis for afib.    Patient is accompained by:  Family member daughter    Pertinent History  Hx of spinal surgeries    Patient Stated Goals  decrease pain, improve walking, improve balance     Currently in Pain?  Yes    Pain Score  5     Pain Location  Back    Pain Orientation  Lower    Pain Descriptors / Indicators  Sharp    Pain Type  Chronic pain                       [facility] Adult PT Treatment/Exercise - 10/25/17 0001      Neck Exercises: Machines for Strengthening   UBE (Upper Arm Bike)  L1.0 x 6' (3' fwd/3' back)      Neck Exercises: Seated   Other Seated Exercise  Scap retraction 10 x 3" - cues to avoid shoulder elevation          Balance Exercises - 10/25/17 1450      OTAGO PROGRAM   Head Movements  Sitting;5 reps    Neck Movements  Sitting;5 reps    Back Extension  Standing;5 reps  back to countertop     Trunk Movements  Standing;5 reps    Ankle Movements  Sitting;10 reps    Knee Extensor  10 reps    Knee Flexor  10 reps    Hip ABductor  10 reps    Ankle Plantorflexors  20 reps, support    Ankle Dorsiflexors  20 reps, support    Knee Bends  10 reps, support    Backwards Walking  Support    Walking and Turning Around  No assistive device        PT Education - 10/25/17 1530    Education Details  Initial HEP - scap retractions + initiated training in [place] fall prevention program    Person(s) Educated  Patient;Child(ren)    Methods  Explanation;Demonstration;Handout    Comprehension  Verbalized understanding;Returned demonstration       PT Short Term Goals - 10/25/17 1459      PT SHORT TERM GOAL #1   Title  Pt will be independent with HEP    Status  On-going        PT Long Term Goals - 10/25/17 1459      PT LONG TERM GOAL #1   Title  Pt will decrease normal TUG time by > 2 seconds to decrease risk for falls    Status  On-going      PT LONG TERM GOAL #2   Title  Pt to improve gait speed to 3.0 ft/s to improve  ability for home ambulation     Status  On-going      PT LONG TERM GOAL #3   Title  Pt will decrease 5xSTS time by > 4 seconds to improve ability to perform household activities for longer periods of time    Status  On-going      PT LONG TERM GOAL #4   Title  Pt will report being able to stand while preparing meals for >/= 30 min without increased pain    Status  On-going            Plan - 10/25/17 1459    Clinical Impression Statement  Alverna reporting her neck pain seems to originate in upper back and radiate to neck w/o predictable trigger. She demonstrates significant forward head and rounded shoulder posture which places scapular and cervical muscles in an overstretched position resulting in increased muscle tension, but pt denies ttp. Initiated training in HEP focusing on scapular retractions for postural correction and initiation of training in [place] fall prevention program to address balance deficits - will plan to review as needed and complete training in remaining appropriate activities as indicated on next visit.    Rehab Potential  Fair    Clinical Impairments Affecting Rehab Potential  Chronicity of other musculoskeletal pain areas    PT Treatment/Interventions  ADLs/Self Care Home Management;Cryotherapy;Electrical Stimulation;Iontophoresis 4mg /ml Dexamethasone;Moist Heat;Traction;Gait training;Stair training;Functional mobility training;Therapeutic activities;Therapeutic exercise;Patient/family education;Neuromuscular re-education;Balance training;Manual techniques;DME Instruction;Vasopneumatic Device    PT Next Visit Plan  Review/complete training in fall prevention program    Consulted and Agree with Plan of Care  Patient;Family member/caregiver    Family Member Consulted  daughter       Patient will benefit from skilled therapeutic intervention in order to improve the following deficits and impairments:  Abnormal gait, Decreased activity tolerance, Decreased balance,  Decreased knowledge of use of DME, Decreased safety awareness, Decreased range of motion, Decreased skin integrity, Decreased strength, Difficulty walking, Postural dysfunction, Improper body mechanics, Pain  Visit Diagnosis: Unsteadiness on  feet  Cervicalgia  Muscle weakness (generalized)  Abnormal posture     Problem List Patient Active Problem List   Diagnosis Date Noted  . Shoulder tendinitis 10/05/2014  . Postoperative anemia due to acute blood loss 11/23/2013    Class: Acute  . Spinal stenosis, lumbar region, with neurogenic claudication 11/21/2013    Marry Guan, PT, MPT 10/25/2017, 4:04 PM  [facility] [address]  [address] [place], [place], [address] Phone: [phone]   Fax:  [phone]  Name: TASHYA PATCH MRN: [id number] Date of Birth: [birth year]

## 2017-11-02 ENCOUNTER — Ambulatory Visit: Payer: Medicare Other | Admitting: Physical Therapy

## 2017-11-02 DIAGNOSIS — R2681 Unsteadiness on feet: Secondary | ICD-10-CM | POA: Diagnosis not present

## 2017-11-02 DIAGNOSIS — M6281 Muscle weakness (generalized): Secondary | ICD-10-CM

## 2017-11-02 DIAGNOSIS — R293 Abnormal posture: Secondary | ICD-10-CM

## 2017-11-02 DIAGNOSIS — M542 Cervicalgia: Secondary | ICD-10-CM

## 2017-11-02 NOTE — Therapy (Signed)
[facility] [address]  [address] [place], [place], [address] Phone: [phone]   Fax:  [phone]  Physical Therapy Treatment  Patient Details  Name: Amber Yang MRN: [id number] Date of Birth: 04-05-1931 Referring Provider: Vira Browns, MD   Encounter Date: 11/02/2017  PT End of Session - 11/02/17 1453    Visit Number  3    Number of Visits  9    Date for PT Re-Evaluation  11/09/17    Authorization Type  Medicare    PT Start Time  1404    PT Stop Time  1442    PT Time Calculation (min)  38 min    Activity Tolerance  Patient tolerated treatment well;No increased pain    Behavior During Therapy  WFL for tasks assessed/performed       Past Medical History:  Diagnosis Date  . Anxiety   . Asthma   . Constipation   . COPD (chronic obstructive pulmonary disease) (HCC)   . COPD, severe (HCC)   . Coronary artery disease   . DDD (degenerative disc disease)   . GERD (gastroesophageal reflux disease)   . H/O echocardiogram 01/27/2013  . Hearing loss   . History of colon polyps   . History of nuclear stress test 12/20/2012  . Hyperlipemia, mixed   . Hypertension   . Hypothyroidism   . Mitral valve regurgitation   . Nocturnal leg cramps   . Peripheral vascular disease (HCC)    followed by Dr Sondra Come  . Rheumatoid arthritis (HCC)   . Shortness of breath    exertional  . UTI (urinary tract infection) 05/2013    Past Surgical History:  Procedure Laterality Date  . ABDOMINAL HYSTERECTOMY    . CAROTID ENDARTERECTOMY Right 05/2013  . CATARACT EXTRACTION, BILATERAL    . CERVICAL FUSION  2012  . COLONOSCOPY W/ POLYPECTOMY  2008  . EYE SURGERY    . FOOT SURGERY Right   . VASCULAR SURGERY      There were no vitals filed for this visit.  Subjective Assessment - 11/02/17 1408    Subjective  Pt reports that her sinuses are giving her trouble today and that she has not been feeling well recently.  Has noticed that her  ability to step up on stairs with her trailing foot has improved. She has trouble getting in and out of her new car Higher education careers adviser).      Patient is accompained by:  Family member    Pertinent History  Hx of spinal surgeries    Limitations  Standing;Walking;House hold activities    Patient Stated Goals  decrease pain, improve walking, improve balance     Currently in Pain?  No/denies    Pain Score  0-No pain    Pain Onset  --    Multiple Pain Sites  No                       [facility] Adult PT Treatment/Exercise - 11/02/17 0001      Neck Exercises: Machines for Strengthening   Nustep  L3 x 6 min (B UE/LE)      Neck Exercises: Theraband   Scapula Retraction  10 reps;Other (comment)    Scapula Retraction Limitations  Yellow TBand; TC for downward motion     Shoulder Extension  10 reps;Other (comment)    Shoulder Extension Limitations  Yellow TBand, Cueing to keep elbows straight  Balance Exercises - 11/02/17 1418      Balance Exercises: Standing   SLS with Vectors  Solid surface;Upper extremity assist 1;3 reps x 3 sets; Dots at 1,2,3 and 11,10,9    Stepping Strategy  --    Other Standing Exercises  --      OTAGO PROGRAM   Ankle Plantorflexors  20 reps, support    Walking and Turning Around  No assistive device    Tandem Stance  10 seconds, no support    Tandem Walk  Support    One Leg Stand  10 seconds, support    Heel Walking  Support    Toe Walk  Support    Heel Toe Walking Backward  -- 1 UE support on counter    Sit to Stand  5 reps, one support 5 reps 2 UE support, 5 reps 1 UE support        PT Education - 11/02/17 1452    Education Details  Finished instruction of [place] fall prevention program     Person(s) Educated  Patient    Methods  Explanation;Demonstration    Comprehension  Verbalized understanding;Returned demonstration       PT Short Term Goals - 10/25/17 1459      PT SHORT TERM GOAL #1   Title  Pt will be independent with HEP     Status  On-going        PT Long Term Goals - 10/25/17 1459      PT LONG TERM GOAL #1   Title  Pt will decrease normal TUG time by > 2 seconds to decrease risk for falls    Status  On-going      PT LONG TERM GOAL #2   Title  Pt to improve gait speed to 3.0 ft/s to improve ability for home ambulation     Status  On-going      PT LONG TERM GOAL #3   Title  Pt will decrease 5xSTS time by > 4 seconds to improve ability to perform household activities for longer periods of time    Status  On-going      PT LONG TERM GOAL #4   Title  Pt will report being able to stand while preparing meals for >/= 30 min without increased pain    Status  On-going            Plan - 11/02/17 1454    Clinical Impression Statement  Finished instructing [place] fall prevention program exercises during today's session. Pt does not mention any pain today, but demonstrates scapular dyskinesia during scapular retraction activities with significant tactile cueing needed for L scapular depression. Pt also demonstrates various compensations during balance activities, including rotating her body so that all reaching with LEs will be done in the frontal plane.  Pt will continue to benefit from PT to improve ability to perform balance activities, improve LE strength, and progress toward functional goals.     Clinical Impairments Affecting Rehab Potential  Chronicity of other musculoskeletal pain areas    PT Treatment/Interventions  ADLs/Self Care Home Management;Cryotherapy;Electrical Stimulation;Iontophoresis 4mg /ml Dexamethasone;Moist Heat;Traction;Gait training;Stair training;Functional mobility training;Therapeutic activities;Therapeutic exercise;Patient/family education;Neuromuscular re-education;Balance training;Manual techniques;DME Instruction;Vasopneumatic Device    Consulted and Agree with Plan of Care  Patient;Family member/caregiver       Patient will benefit from skilled therapeutic intervention in order  to improve the following deficits and impairments:  Abnormal gait, Decreased activity tolerance, Decreased balance, Decreased knowledge of use of DME, Decreased safety awareness, Decreased range  of motion, Decreased skin integrity, Decreased strength, Difficulty walking, Postural dysfunction, Improper body mechanics, Pain  Visit Diagnosis: Unsteadiness on feet  Cervicalgia  Muscle weakness (generalized)  Abnormal posture     Problem List Patient Active Problem List   Diagnosis Date Noted  . Shoulder tendinitis 10/05/2014  . Postoperative anemia due to acute blood loss 11/23/2013    Class: Acute  . Spinal stenosis, lumbar region, with neurogenic claudication 11/21/2013    Mikal Plane, SPT 11/02/2017, 4:56 PM  [facility] [address]  [address] [place], [place], [address] Phone: [phone]   Fax:  [phone]  Name: BRISEIDY AMALFITANO MRN: [id number] Date of Birth: 1931-01-26

## 2017-11-04 ENCOUNTER — Encounter: Payer: Medicare Other | Admitting: Physical Therapy

## 2017-11-05 ENCOUNTER — Encounter: Payer: Self-pay | Admitting: Physical Therapy

## 2017-11-05 ENCOUNTER — Ambulatory Visit: Payer: Medicare Other | Admitting: Physical Therapy

## 2017-11-05 DIAGNOSIS — R2681 Unsteadiness on feet: Secondary | ICD-10-CM

## 2017-11-05 DIAGNOSIS — M6281 Muscle weakness (generalized): Secondary | ICD-10-CM

## 2017-11-05 DIAGNOSIS — M542 Cervicalgia: Secondary | ICD-10-CM

## 2017-11-05 DIAGNOSIS — R293 Abnormal posture: Secondary | ICD-10-CM

## 2017-11-05 NOTE — Therapy (Signed)
[facility] [address]  [address] [place], [place], [address] Phone: [phone]   Fax:  [phone]  Physical Therapy Treatment  Patient Details  Name: Amber Yang MRN: [id number] Date of Birth: 10/30/1930 Referring Provider: Vira Browns, MD   Encounter Date: 11/05/2017  PT End of Session - 11/05/17 1114    Visit Number  4    Number of Visits  9    Date for PT Re-Evaluation  11/09/17    Authorization Type  Medicare    PT Start Time  1016    PT Stop Time  1058    PT Time Calculation (min)  42 min    Equipment Utilized During Treatment  Gait belt    Activity Tolerance  Patient tolerated treatment well    Behavior During Therapy  [facility] for tasks assessed/performed       Past Medical History:  Diagnosis Date  . Anxiety   . Asthma   . Constipation   . COPD (chronic obstructive pulmonary disease) (HCC)   . COPD, severe (HCC)   . Coronary artery disease   . DDD (degenerative disc disease)   . GERD (gastroesophageal reflux disease)   . H/O echocardiogram 01/27/2013  . Hearing loss   . History of colon polyps   . History of nuclear stress test 12/20/2012  . Hyperlipemia, mixed   . Hypertension   . Hypothyroidism   . Mitral valve regurgitation   . Nocturnal leg cramps   . Peripheral vascular disease (HCC)    followed by Dr Sondra Come  . Rheumatoid arthritis (HCC)   . Shortness of breath    exertional  . UTI (urinary tract infection) 05/2013    Past Surgical History:  Procedure Laterality Date  . ABDOMINAL HYSTERECTOMY    . CAROTID ENDARTERECTOMY Right 05/2013  . CATARACT EXTRACTION, BILATERAL    . CERVICAL FUSION  2012  . COLONOSCOPY W/ POLYPECTOMY  2008  . EYE SURGERY    . FOOT SURGERY Right   . VASCULAR SURGERY      There were no vitals filed for this visit.      [facility] PT Assessment - 11/05/17 0001      Standardized Balance Assessment   Standardized Balance Assessment  Berg Balance Test      Berg  Balance Test   Sit to Stand  Able to stand  independently using hands    Standing Unsupported  Able to stand safely 2 minutes    Sitting with Back Unsupported but Feet Supported on Floor or Stool  Able to sit safely and securely 2 minutes    Stand to Sit  Sits safely with minimal use of hands    Transfers  Able to transfer safely, minor use of hands    Standing Unsupported with Eyes Closed  Able to stand 10 seconds with supervision    Standing Ubsupported with Feet Together  Able to place feet together independently and stand for 1 minute with supervision    From Standing, Reach Forward with Outstretched Arm  Can reach forward >12 cm safely (5")    From Standing Position, Pick up Object from Floor  Able to pick up shoe safely and easily    From Standing Position, Turn to Look Behind Over each Shoulder  Turn sideways only but maintains balance    Turn 360 Degrees  Able to turn 360 degrees safely but slowly    Standing Unsupported, Alternately Place Feet on Step/Stool  Able to  stand independently and complete 8 steps >20 seconds    Standing Unsupported, One Foot in Front  Able to take small step independently and hold 30 seconds    Standing on One Leg  Able to lift leg independently and hold equal to or more than 3 seconds    Total Score  43                   [facility] Adult PT Treatment/Exercise - 11/05/17 0001      Neck Exercises: Machines for Strengthening   UBE (Upper Arm Bike)  L1 x 6' (4' forward, 2 min backward)      Shoulder Exercises: Standing   Horizontal ABduction  10 reps;Both;Limitations    Horizontal ABduction Limitations  Standing against longitudinal pool noodle to cue for scapular retraction    External Rotation  Both;15 reps;Other (comment)    External Rotation Limitations  Standing against pool noodle to cue for scapular retraction; Pt demonstrates weakness in L ER with upper trap compensation    Row  Both;10 reps    Row Limitations  Standing against longitudinal  pool noodle to cue for scapular retraction          Balance Exercises - 11/05/17 1108      Balance Exercises: Standing   Step Ups  4 inch;Intermittent UE support;Forward Toe Tapping 10 reps each LE; Weakness R>L          PT Short Term Goals - 11/05/17 1146      PT SHORT TERM GOAL #1   Title  Pt will be independent with HEP    Status  Achieved        PT Long Term Goals - 11/05/17 1115      PT LONG TERM GOAL #1   Title  Pt will decrease normal TUG time by > 2 seconds to decrease risk for falls    Status  On-going    Target Date  11/09/17      PT LONG TERM GOAL #2   Title  Pt to improve gait speed to 3.0 ft/s to improve ability for home ambulation     Status  On-going    Target Date  11/09/17      PT LONG TERM GOAL #3   Title  Pt will decrease 5xSTS time by > 4 seconds to improve ability to perform household activities for longer periods of time    Status  On-going    Target Date  11/09/17      PT LONG TERM GOAL #4   Title  Pt will report being able to stand while preparing meals for >/= 30 min without increased pain    Status  On-going    Target Date  11/09/17      PT LONG TERM GOAL #5   Title  Pt will improve Berg score to 48/50 to improve ability to perform ADLs without LOB    Status  New    Target Date  11/09/17            Plan - 11/05/17 1119    Clinical Impression Statement  Karrissa is progressing well with therapy and continues to improve LE strength and balance confidence. Berg Balance Test administered during today's session and pt continues to have difficulty with activities involving anticipatory postural adjustments. L upper trap compensation noticed during activities and addressed by PT, which is contributing to prolonged cervical pain. She will continue to benefit from therapy to continue to increase her ability to maintain  balance during ADLs, decrease her neck pain, and improve her QoL.      Rehab Potential  Fair    Clinical Impairments  Affecting Rehab Potential  Chronicity of other musculoskeletal pain areas    PT Treatment/Interventions  ADLs/Self Care Home Management;Cryotherapy;Electrical Stimulation;Iontophoresis 4mg /ml Dexamethasone;Moist Heat;Traction;Gait training;Stair training;Functional mobility training;Therapeutic activities;Therapeutic exercise;Patient/family education;Neuromuscular re-education;Balance training;Manual techniques;DME Instruction;Vasopneumatic Device    Consulted and Agree with Plan of Care  Patient;Family member/caregiver    Family Member Consulted  daughter       Patient will benefit from skilled therapeutic intervention in order to improve the following deficits and impairments:  Abnormal gait, Decreased activity tolerance, Decreased balance, Decreased knowledge of use of DME, Decreased safety awareness, Decreased range of motion, Decreased skin integrity, Decreased strength, Difficulty walking, Postural dysfunction, Improper body mechanics, Pain  Visit Diagnosis: Unsteadiness on feet  Cervicalgia  Muscle weakness (generalized)  Abnormal posture     Problem List Patient Active Problem List   Diagnosis Date Noted  . Shoulder tendinitis 10/05/2014  . Postoperative anemia due to acute blood loss 11/23/2013    Class: Acute  . Spinal stenosis, lumbar region, with neurogenic claudication 11/21/2013    11/23/2013, SPT 11/05/2017, 11:50 AM  [facility] [address]  [address] [place], [place], [place] Phone: [phone]   Fax:  [phone]  Name: CHITARA CLONCH MRN: Vilma Meckel Date of Birth: 02-04-1931

## 2017-11-08 ENCOUNTER — Ambulatory Visit (INDEPENDENT_AMBULATORY_CARE_PROVIDER_SITE_OTHER): Payer: Medicare Other | Admitting: Specialist

## 2017-11-15 ENCOUNTER — Encounter: Payer: Self-pay | Admitting: Physical Therapy

## 2017-11-15 ENCOUNTER — Ambulatory Visit: Payer: Medicare Other | Admitting: Physical Therapy

## 2017-11-15 DIAGNOSIS — M6281 Muscle weakness (generalized): Secondary | ICD-10-CM

## 2017-11-15 DIAGNOSIS — R293 Abnormal posture: Secondary | ICD-10-CM

## 2017-11-15 DIAGNOSIS — M542 Cervicalgia: Secondary | ICD-10-CM

## 2017-11-15 DIAGNOSIS — R2681 Unsteadiness on feet: Secondary | ICD-10-CM | POA: Diagnosis not present

## 2017-11-15 NOTE — Therapy (Addendum)
[id number] Outpatient Rehabilitation [facility] [address]  [address] [place], [place], [address] Phone: [phone]   Fax:  [phone]  Physical Therapy Treatment  Patient Details  Name: Amber Yang MRN: [id number] Date of Birth: 07/11/1930 Referring Provider: Vira Browns, MD   Encounter Date: 11/15/2017  PT End of Session - 11/15/17 1404    Visit Number  5    Number of Visits  9    Date for PT Re-Evaluation  01/03/18    Authorization Type  Medicare    PT Start Time  1402    PT Stop Time  1444    PT Time Calculation (min)  42 min       Past Medical History:  Diagnosis Date  . Anxiety   . Asthma   . Constipation   . COPD (chronic obstructive pulmonary disease) (HCC)   . COPD, severe (HCC)   . Coronary artery disease   . DDD (degenerative disc disease)   . GERD (gastroesophageal reflux disease)   . H/O echocardiogram 01/27/2013  . Hearing loss   . History of colon polyps   . History of nuclear stress test 12/20/2012  . Hyperlipemia, mixed   . Hypertension   . Hypothyroidism   . Mitral valve regurgitation   . Nocturnal leg cramps   . Peripheral vascular disease (HCC)    followed by Dr Sondra Come  . Rheumatoid arthritis (HCC)   . Shortness of breath    exertional  . UTI (urinary tract infection) 05/2013    Past Surgical History:  Procedure Laterality Date  . ABDOMINAL HYSTERECTOMY    . CAROTID ENDARTERECTOMY Right 05/2013  . CATARACT EXTRACTION, BILATERAL    . CERVICAL FUSION  2012  . COLONOSCOPY W/ POLYPECTOMY  2008  . EYE SURGERY    . FOOT SURGERY Right   . VASCULAR SURGERY      There were no vitals filed for this visit.  Subjective Assessment - 11/15/17 1407    Subjective  Pt reports that she is okay today and that her neck pain hasn't been as frequent. She continues to suffer from her sinuses and has not been outside as much due to this.     Patient is accompained by:  Family member    Pertinent History  Hx of spinal surgeries     Limitations  Standing;Walking;House hold activities    Patient Stated Goals  decrease pain, improve walking, improve balance     Currently in Pain?  Yes    Pain Score  3    Pain Location  Neck    Pain Descriptors / Indicators  Aching                       [facility] Adult PT Treatment/Exercise - 11/15/17 0001      Ambulation/Gait   Pre-Gait Activities       Gait Comments  4 x 40 ft walking while tossing up and catching small ball. Pt step length decreased dramatically during activity and pt had difficulty catching ball once it was tossed. Pt dropped ball 2x during activity.       Neuro Re-ed    Neuro Re-ed Details   Pt walked around clinic to find/pick up 10 cones in various places around clinic. Pt able to locate cones at eye level or lower without assistance but required cueing to look above eye level to find 3 cones      Neck Exercises: Machines for Strengthening  Nustep  L4 x 6 min (B UE/LE)          Balance Exercises - 11/15/17 1649      Balance Exercises: Standing   Standing Eyes Opened  Head turns;Foam/compliant surface;3 reps;Limitations Standing on incline with Horizontal and vertical head turns    Step Ups  Forward;4 inch;UE support 1    Balance Beam  Forward/Retro/SideStepping: 4x each direction. Pt experienced 2 LOB posteriorly during forward tandem gait.     Tandem Gait  --    Sidestepping  --    Cone Rotation Limitations  Pt walked around clinic to find/pick up 10 cones in various places around clinic. Pt able to locate cones at eye level or lower without assistance but required cueing to look above eye level to find 3 cones    Other Standing Exercises  Standing on airex pad reaching for bean bag and tossing into bin 10 ft away x 25. Pt required supervision from therapist and prevention of LOB posteriorly 1x.       Balance Exercises: Standing   Standing Eyes Opened Limitations  Standing on incline with Horizontal and vertical head turns; 3 x approx 2 min  or until observed fatigue          PT Short Term Goals - 11/05/17 1146      PT SHORT TERM GOAL #1   Title  Pt will be independent with HEP    Status  Achieved        PT Long Term Goals - 11/15/17 1849      PT LONG TERM GOAL #1   Title  Pt will decrease normal TUG time by > 2 seconds to decrease risk for falls    Status  On-going    Target Date  01/03/18      PT LONG TERM GOAL #2   Title  Pt to improve gait speed to 3.0 ft/s to improve ability for home ambulation     Status  On-going    Target Date  01/03/18      PT LONG TERM GOAL #3   Title  Pt will decrease 5xSTS time by > 4 seconds to improve ability to perform household activities for longer periods of time    Status  On-going    Target Date  01/03/18      PT LONG TERM GOAL #4   Title  Pt will report being able to stand while preparing meals for >/= 30 min without increased pain    Status  On-going    Target Date  01/03/18      PT LONG TERM GOAL #5   Title  Pt will improve Berg score to 48/50 to improve ability to perform ADLs without LOB    Status  On-going    Target Date  01/03/18            Plan - 11/15/17 1637    Clinical Impression Statement  Session today focused on challenging Shelena's balance ability and recruit muscles to prevent LOB with standing on an incline, reaching to limit of stability for bean bag, and walking on a compliant surface. During these activities pt demonstrates difficulty maintaining balance and PT intervention necessary to prevent LOB backward. Pt continues to demonstrate difficulty with utilizing hip strategy to recover balance with increased muscle fatigue after a few minutes of these activities. Pt continues to demonstrate decreased gait speed, impaired balance ability, and decreased LE strength. Remaining visits in initial certification period not utilized secondary to pt  scheduling 1x/week instead of 2x/week as was recommended at initial evaluation. At this time pt will benefit  from continued therapy to address above mentioned impairments, meet pt goals and progress toward functional goals and therefore will be seen 1x/week for additional 6 visits to reach functional goals.     PT Frequency  1x / week    PT Duration  6 weeks    PT Treatment/Interventions  ADLs/Self Care Home Management;Cryotherapy;Electrical Stimulation;Iontophoresis 4mg /ml Dexamethasone;Moist Heat;Traction;Gait training;Stair training;Functional mobility training;Therapeutic activities;Therapeutic exercise;Patient/family education;Neuromuscular re-education;Balance training;Manual techniques;DME Instruction;Vasopneumatic Device    PT Next Visit Plan  Assess LTGs    Consulted and Agree with Plan of Care  Patient;Family member/caregiver    Family Member Consulted  daughter       Patient will benefit from skilled therapeutic intervention in order to improve the following deficits and impairments:  Abnormal gait, Decreased activity tolerance, Decreased balance, Decreased knowledge of use of DME, Decreased safety awareness, Decreased range of motion, Decreased skin integrity, Decreased strength, Difficulty walking, Postural dysfunction, Improper body mechanics, Pain  Visit Diagnosis: No diagnosis found.     Problem List Patient Active Problem List   Diagnosis Date Noted  . Shoulder tendinitis 10/05/2014  . Postoperative anemia due to acute blood loss 11/23/2013    Class: Acute  . Spinal stenosis, lumbar region, with neurogenic claudication 11/21/2013    Mikal Plane, SPT 11/15/2017, 6:53 PM  [facility] [address]  [address] [place], [place], [address] Phone: [phone]   Fax:  [phone]  Name: KARLENA GILFILLAN MRN: [id number] Date of Birth: 1931/04/04   This entire session was performed under direct supervision and direction of a licensed PT. I have personally read, edited and approved of the note as written.  Marry Guan, PT,  MPT 11/15/17, 7:14 PM  [facility] [address]  [address] [place], [place], [address] Phone: [phone]   Fax:  [phone]

## 2017-11-17 ENCOUNTER — Encounter: Payer: Medicare Other | Admitting: Physical Therapy

## 2017-11-19 ENCOUNTER — Ambulatory Visit (INDEPENDENT_AMBULATORY_CARE_PROVIDER_SITE_OTHER): Payer: Medicare Other | Admitting: Specialist

## 2017-11-19 ENCOUNTER — Encounter (INDEPENDENT_AMBULATORY_CARE_PROVIDER_SITE_OTHER): Payer: Self-pay | Admitting: Specialist

## 2017-11-19 VITALS — BP 142/68 | HR 64 | Ht 61.0 in | Wt 133.0 lb

## 2017-11-19 DIAGNOSIS — R2689 Other abnormalities of gait and mobility: Secondary | ICD-10-CM

## 2017-11-19 DIAGNOSIS — M48062 Spinal stenosis, lumbar region with neurogenic claudication: Secondary | ICD-10-CM

## 2017-11-19 MED ORDER — BACLOFEN 10 MG PO TABS: ORAL_TABLET | ORAL | 0 refills | Status: DC

## 2017-11-19 MED ORDER — HYDROCODONE-ACETAMINOPHEN 10-325 MG PO TABS: ORAL_TABLET | ORAL | 0 refills | Status: DC

## 2017-11-19 NOTE — Patient Instructions (Signed)
Avoid bending, stooping and avoid lifting weights greater than 10 lbs. Avoid prolong standing and walking. Avoid frequent bending and stooping  No lifting greater than 10 lbs. May use ice or moist heat for pain. Weight loss is of benefit. Handicap license is approved. Will try steroid at low dose if the pain in the back worsens again. Continue with PT until you can transition to a yoga or senior adult exercise program  As a maintenance program to prevent falls.

## 2017-11-19 NOTE — Progress Notes (Signed)
Office Visit Note   Patient: Amber Yang           Date of Birth: [birth year]           MRN: [id number] Visit Date: 11/19/2017              Requested by: Amelia Jo, FNP [address] [place], [place] [address] PCP: Amelia Jo, FNP   Assessment & Plan: Visit Diagnoses:  1. Balance disorder   2. Spinal stenosis, lumbar region, with neurogenic claudication     Plan:Avoid bending, stooping and avoid lifting weights greater than 10 lbs. Avoid prolong standing and walking. Avoid frequent bending and stooping  No lifting greater than 10 lbs. May use ice or moist heat for pain. Weight loss is of benefit. Handicap license is approved. Will try steroid at low dose if the pain in the back worsens again. Continue with PT until you can transition to a yoga or senior adult exercise program  As a maintenance program to prevent falls.   Follow-Up Instructions: No follow-ups on file.   Orders:  No orders of the defined types were placed in this encounter.  No orders of the defined types were placed in this encounter.     Procedures: No procedures performed   Clinical Data: No additional findings.   Subjective: Chief Complaint  Patient presents with  . Neck - Follow-up    84 year old female with history of lumbar decompression and fusion L4-5 for anterolisthesis and multiple level DDD. She is having persistent pain with standing and walking and balance Difficutly. No bowel or bladder difficulty. She is taking prednisone for her sinuses presently.   Review of Systems  Constitutional: Negative.   HENT: Positive for congestion, postnasal drip, rhinorrhea, sinus pressure and sinus pain.   Eyes: Positive for pain and redness. Negative for discharge and itching.  Respiratory: Negative for apnea, cough, choking, chest tightness, shortness of breath and stridor.   Cardiovascular: Negative.  Negative for chest pain, palpitations and leg swelling.    Gastrointestinal: Negative.  Negative for abdominal distention, abdominal pain and anal bleeding.  Endocrine: Negative.  Negative for cold intolerance, heat intolerance, polydipsia, polyphagia and polyuria.  Genitourinary: Negative for difficulty urinating, dyspareunia, dysuria, enuresis, flank pain, frequency and genital sores.  Musculoskeletal: Negative for arthralgias and back pain.  Skin: Negative.   Allergic/Immunologic: Negative.  Negative for environmental allergies and food allergies.  Neurological: Positive for weakness and light-headedness.  Hematological: Negative.  Negative for adenopathy. Does not bruise/bleed easily.  Psychiatric/Behavioral: Negative.  Negative for behavioral problems, confusion, decreased concentration, dysphoric mood, hallucinations, self-injury, sleep disturbance and suicidal ideas. The patient is not nervous/anxious and is not hyperactive.      Objective: Vital Signs: BP (!) 142/68 (BP Location: Left Arm, Patient Position: Sitting)   Pulse 64   Ht 5\' 1"  (1.549 m)   Wt 133 lb (60.3 kg)   BMI 25.13 kg/m   Physical Exam  Constitutional: She is oriented to person, place, and time. She appears well-developed and well-nourished.  HENT:  Head: Normocephalic and atraumatic.  Eyes: Pupils are equal, round, and reactive to light. EOM are normal.  Neck: Normal range of motion. Neck supple.  Pulmonary/Chest: Effort normal and breath sounds normal.  Abdominal: Soft. Bowel sounds are normal.  Neurological: She is alert and oriented to person, place, and time.  Skin: Skin is warm and dry.  Psychiatric: She has a normal mood and affect. Her behavior is normal. Judgment  and thought content normal.    Back Exam   Tenderness  The patient is experiencing tenderness in the lumbar.  Range of Motion  Extension: abnormal  Flexion: abnormal  Lateral bend right: normal  Rotation right: normal  Rotation left: normal   Muscle Strength  Right Quadriceps:  5/5   Left Quadriceps:  5/5  Right Hamstrings:  5/5  Left Hamstrings:  5/5   Tests  Straight leg raise right: negative Straight leg raise left: negative  Reflexes  Patellar: normal Achilles: normal Biceps: normal Babinski's sign: normal   Other  Toe walk: normal Heel walk: normal Sensation: normal Gait: normal       Specialty Comments:  No specialty comments available.  Imaging: No results found.   PMFS History: Patient Active Problem List   Diagnosis Date Noted  . Postoperative anemia due to acute blood loss 11/23/2013    Priority: Low    Class: Acute  . Shoulder tendinitis 10/05/2014  . Spinal stenosis, lumbar region, with neurogenic claudication 11/21/2013   Past Medical History:  Diagnosis Date  . Anxiety   . Asthma   . Constipation   . COPD (chronic obstructive pulmonary disease) (HCC)   . COPD, severe (HCC)   . Coronary artery disease   . DDD (degenerative disc disease)   . GERD (gastroesophageal reflux disease)   . H/O echocardiogram 01/27/2013  . Hearing loss   . History of colon polyps   . History of nuclear stress test 12/20/2012  . Hyperlipemia, mixed   . Hypertension   . Hypothyroidism   . Mitral valve regurgitation   . Nocturnal leg cramps   . Peripheral vascular disease (HCC)    followed by Dr Sondra Come  . Rheumatoid arthritis (HCC)   . Shortness of breath    exertional  . UTI (urinary tract infection) 05/2013    No family history on file.  Past Surgical History:  Procedure Laterality Date  . ABDOMINAL HYSTERECTOMY    . CAROTID ENDARTERECTOMY Right 05/2013  . CATARACT EXTRACTION, BILATERAL    . CERVICAL FUSION  2012  . COLONOSCOPY W/ POLYPECTOMY  2008  . EYE SURGERY    . FOOT SURGERY Right   . VASCULAR SURGERY     Social History   Occupational History  . Not on file  Tobacco Use  . Smoking status: Former Smoker    Packs/day: 1.00    Years: 40.00    Pack years: 40.00    Types: Cigarettes    Last attempt to quit: 11/15/2003     Years since quitting: 14.0  . Smokeless tobacco: Never Used  Substance and Sexual Activity  . Alcohol use: No  . Drug use: No  . Sexual activity: Never

## 2017-11-22 ENCOUNTER — Encounter: Payer: Medicare Other | Admitting: Physical Therapy

## 2017-11-24 ENCOUNTER — Ambulatory Visit: Payer: Medicare Other | Admitting: Physical Therapy

## 2017-11-24 ENCOUNTER — Encounter: Payer: Self-pay | Admitting: Physical Therapy

## 2017-11-24 VITALS — HR 94

## 2017-11-24 DIAGNOSIS — M6281 Muscle weakness (generalized): Secondary | ICD-10-CM

## 2017-11-24 DIAGNOSIS — R2681 Unsteadiness on feet: Secondary | ICD-10-CM | POA: Diagnosis not present

## 2017-11-24 DIAGNOSIS — M542 Cervicalgia: Secondary | ICD-10-CM

## 2017-11-24 DIAGNOSIS — R293 Abnormal posture: Secondary | ICD-10-CM

## 2017-11-24 NOTE — Therapy (Addendum)
[facility] Outpatient Rehabilitation [facility] [address]  [address] [place], [place], [address] Phone: [phone]   Fax:  [phone]  Physical Therapy Treatment  Patient Details  Name: Amber Yang MRN: [id number] Date of Birth: 1930-05-12 Referring Provider: Vira Browns, MD   Encounter Date: 11/24/2017  PT End of Session - 11/24/17 1150    Visit Number  6    Number of Visits  9    Date for PT Re-Evaluation  01/03/18    Authorization Type  Medicare    PT Start Time  1145    PT Stop Time  1230    PT Time Calculation (min)  45 min    Equipment Utilized During Treatment  Gait belt    Activity Tolerance  Patient tolerated treatment well    Behavior During Therapy  [facility] for tasks assessed/performed       Past Medical History:  Diagnosis Date  . Anxiety   . Asthma   . Constipation   . COPD (chronic obstructive pulmonary disease) (HCC)   . COPD, severe (HCC)   . Coronary artery disease   . DDD (degenerative disc disease)   . GERD (gastroesophageal reflux disease)   . H/O echocardiogram 01/27/2013  . Hearing loss   . History of colon polyps   . History of nuclear stress test 12/20/2012  . Hyperlipemia, mixed   . Hypertension   . Hypothyroidism   . Mitral valve regurgitation   . Nocturnal leg cramps   . Peripheral vascular disease (HCC)    followed by Dr Sondra Come  . Rheumatoid arthritis (HCC)   . Shortness of breath    exertional  . UTI (urinary tract infection) 05/2013    Past Surgical History:  Procedure Laterality Date  . ABDOMINAL HYSTERECTOMY    . CAROTID ENDARTERECTOMY Right 05/2013  . CATARACT EXTRACTION, BILATERAL    . CERVICAL FUSION  2012  . COLONOSCOPY W/ POLYPECTOMY  2008  . EYE SURGERY    . FOOT SURGERY Right   . VASCULAR SURGERY      Vitals:   11/24/17 1152  Pulse: 94  SpO2: 96%    Subjective Assessment - 11/24/17 1152    Subjective  Pt reports that she feels like she is doing better as far as keeping her balance. Has  recently been put on predinose and is very tired form this and the heat.     Patient Stated Goals  decrease pain, improve walking, improve balance     Currently in Pain?  No/denies    Pain Score  0-No pain    Multiple Pain Sites  No    Pain Score  0         [facility] PT Assessment - 11/24/17 0001      Standardized Balance Assessment   Five times sit to stand comments   13.44       Timed Up and Go Test   Normal TUG (seconds)  10.25    Manual TUG (seconds)  14.34    Cognitive TUG (seconds)  13.59                        Balance Exercises - 11/24/17 1255      Balance Exercises: Standing   Standing Eyes Opened  Foam/compliant surface;Head turns;3 reps;Limitations    Standing Eyes Closed  4 reps;Other (comment);Limitations    Tandem Stance  --    Tandem Gait  Forward;4 reps;Limitations    Sidestepping  Foam/compliant support;Other reps (comment);Other (comment);Limitations    Other Standing Exercises  Standing on airex pad shifting weight anteriorly onto toes and posteriorly onto heels. Pt demonstrated > difficulty with recovering from weight shift posteriorly than anteriorly and required VC from therapist for abdominal contraction to recover balance. 10 reps in each direction. Mod assist from therapist to prevent LOB and regain balance.       Balance Exercises: Standing   Standing Eyes Opened Limitations  On AirEx pad with 1 rep with 10 head turns to R/L. 2 sets x 10 reps of vertical head turns. Pt much more unstable with vertical head turns and tendency for LOB posteriorly. PT intervention required > 3 times to prevent fall against nearby wall.     Standing Eyes Closed Limitations  Forward walking with eyes closed; 4 x 30 ft with CGA to min assist from PT to prevent excessive deviation from course. Pt with tendency to deviate toward therapist and therefore guarding position modified to behind pt. Pt demonstrated milder deviations but still with 6-10 inch deviation to the R      Tandem Gait Limitations  CGA to min assist from PT to prevent LOB. Pt required VC to correctly perform tandem gait. 4 reps x 10 ft     Sidestepping Limitations  Standing on airex pad shifting weight to each LE 2 x 10 reps. Progressed to picking up each LE for <2 sec at a time for 10 reps on each LE. During activity pt assumed more narrow BOS and required VC for adequate spacing of feet during activity. Pt experienced approx 5 LOB with activity with PT intervention or reaching strategy from pt.            PT Short Term Goals - 11/05/17 1146      PT SHORT TERM GOAL #1   Title  Pt will be independent with HEP    Status  Achieved        PT Long Term Goals - 11/24/17 1309      PT LONG TERM GOAL #1   Title  Pt will decrease normal TUG time by > 2 seconds to decrease risk for falls    Status  Achieved      PT LONG TERM GOAL #2   Title  Pt to improve gait speed to 3.0 ft/s to improve ability for home ambulation     Status  On-going      PT LONG TERM GOAL #3   Title  Pt will decrease 5xSTS time by > 4 seconds to improve ability to perform household activities for longer periods of time    Status  Achieved      PT LONG TERM GOAL #4   Title  Pt will report being able to stand while preparing meals for >/= 30 min without increased pain    Status  On-going      PT LONG TERM GOAL #5   Title  Pt will improve Berg score to 48/50 to improve ability to perform ADLs without LOB    Status  On-going            Plan - 11/24/17 1308    Clinical Impression Statement  Azalee is progressing well through this episode of therapy and demonstrate improvements in all versions of TUG time and significant improvement in STS time. Her improvements in TUG time establish her below the threshold for fall risk at this time (normal <13 seconds). She continues to demonstrate difficulty with maintaining balance and  steadiness of gait when required to move her head or concentrate on other tasks, and will  continue to benefit from physical therapy for remaining visits to improve her ability to maintain/recover balance when perturbed, as well as progress toward functional goals.     Rehab Potential  Fair    Clinical Impairments Affecting Rehab Potential  Chronicity of other musculoskeletal pain areas    PT Treatment/Interventions  ADLs/Self Care Home Management;Cryotherapy;Electrical Stimulation;Iontophoresis 4mg /ml Dexamethasone;Moist Heat;Traction;Gait training;Stair training;Functional mobility training;Therapeutic activities;Therapeutic exercise;Patient/family education;Neuromuscular re-education;Balance training;Manual techniques;DME Instruction;Vasopneumatic Device    Consulted and Agree with Plan of Care  Patient       Patient will benefit from skilled therapeutic intervention in order to improve the following deficits and impairments:  Abnormal gait, Decreased activity tolerance, Decreased balance, Decreased knowledge of use of DME, Decreased safety awareness, Decreased range of motion, Decreased skin integrity, Decreased strength, Difficulty walking, Postural dysfunction, Improper body mechanics, Pain  Visit Diagnosis: Unsteadiness on feet  Cervicalgia  Muscle weakness (generalized)  Abnormal posture     Problem List Patient Active Problem List   Diagnosis Date Noted  . Shoulder tendinitis 10/05/2014  . Postoperative anemia due to acute blood loss 11/23/2013    Class: Acute  . Spinal stenosis, lumbar region, with neurogenic claudication 11/21/2013    11/23/2013, SPT 11/24/2017, 1:43 PM  [facility] [address]  [address] [place], [place], [place] Phone: [phone]   Fax:  [phone]  Name: MANJIT BUFANO MRN: Vilma Meckel Date of Birth: 1930/09/07   This entire session was performed under direct supervision and direction of a licensed PT. I have personally read, edited and approved of the note as written.  07/23/1930, PT, MPT 11/24/17, 1:48 PM  [facility] [address]  [address] [place], [place], [place] Phone: [phone]   Fax:  [phone]

## 2017-11-29 ENCOUNTER — Ambulatory Visit: Payer: Medicare Other | Attending: Specialist | Admitting: Physical Therapy

## 2017-11-29 ENCOUNTER — Encounter: Payer: Self-pay | Admitting: Physical Therapy

## 2017-11-29 VITALS — HR 78

## 2017-11-29 DIAGNOSIS — R293 Abnormal posture: Secondary | ICD-10-CM | POA: Diagnosis present

## 2017-11-29 DIAGNOSIS — M6281 Muscle weakness (generalized): Secondary | ICD-10-CM

## 2017-11-29 DIAGNOSIS — M542 Cervicalgia: Secondary | ICD-10-CM

## 2017-11-29 DIAGNOSIS — R2681 Unsteadiness on feet: Secondary | ICD-10-CM | POA: Insufficient documentation

## 2017-11-29 NOTE — Therapy (Addendum)
[facility] Outpatient Rehabilitation [facility] [address]  [address] [place], [place], [address] Phone: [phone]   Fax:  [phone]  Physical Therapy Treatment  Patient Details  Name: Amber Yang MRN: [id number] Date of Birth: Jan 30, 1931 Referring Provider: Vira Browns, MD   Encounter Date: 11/29/2017  PT End of Session - 11/29/17 1440    Visit Number  7    Number of Visits  9    Date for PT Re-Evaluation  01/03/18    Authorization Type  Medicare    PT Start Time  1347    PT Stop Time  1437    PT Time Calculation (min)  50 min    Equipment Utilized During Treatment  Gait belt    Activity Tolerance  Patient tolerated treatment well    Behavior During Therapy  [facility] for tasks assessed/performed       Past Medical History:  Diagnosis Date  . Anxiety   . Asthma   . Constipation   . COPD (chronic obstructive pulmonary disease) (HCC)   . COPD, severe (HCC)   . Coronary artery disease   . DDD (degenerative disc disease)   . GERD (gastroesophageal reflux disease)   . H/O echocardiogram 01/27/2013  . Hearing loss   . History of colon polyps   . History of nuclear stress test 12/20/2012  . Hyperlipemia, mixed   . Hypertension   . Hypothyroidism   . Mitral valve regurgitation   . Nocturnal leg cramps   . Peripheral vascular disease (HCC)    followed by Dr Sondra Come  . Rheumatoid arthritis (HCC)   . Shortness of breath    exertional  . UTI (urinary tract infection) 05/2013    Past Surgical History:  Procedure Laterality Date  . ABDOMINAL HYSTERECTOMY    . CAROTID ENDARTERECTOMY Right 05/2013  . CATARACT EXTRACTION, BILATERAL    . CERVICAL FUSION  2012  . COLONOSCOPY W/ POLYPECTOMY  2008  . EYE SURGERY    . FOOT SURGERY Right   . VASCULAR SURGERY      Vitals:   11/29/17 1528  Pulse: 78  SpO2: 96%    Subjective Assessment - 11/29/17 1353    Subjective  Pt's grandson had a heart attack yesterday so she is worried about him today. Her daughter  that brings her to therapy will be leaving tonight to travel to [facility] to see her son. Reports no pain today.     Patient is accompained by:  Family member Daughter    Pertinent History  Hx of spinal surgeries    Limitations  Standing;Walking;House hold activities    Patient Stated Goals  decrease pain, improve walking, improve balance     Currently in Pain?  No/denies         [facility] PT Assessment - 11/29/17 0001      Ambulation/Gait   Gait velocity  2.6 ft/s                   [facility] Adult PT Treatment/Exercise - 11/29/17 0001      Neck Exercises: Machines for Strengthening   Nustep  L4 x 6 min (B UE/LE)      Lumbar Exercises: Seated   Hip Flexion on Ball  Both;10 reps    Hip Flexion on Ball Limitations  on Orange PB; With intermittent R UE assist and CGA from PT.    Sit to Stand  Limitations    Sit to Stand Limitations  Sit to stands from  Orange PB; 10 reps with no UE support    Other Seated Lumbar Exercises  Sitting on orange PB with perterbations from therapist 2 x ~1 min. Pt demonstrated difficulty with mainitaining balance when pushed to the posterior direction or to the R    Other Seated Lumbar Exercises  UE horizontal abduction while sitting on Orange PB. Yellow TB.10 reps          Balance Exercises - 11/29/17 1438      Balance Exercises: Standing   Standing Eyes Closed  Solid surface;3 reps;Limitations    Rockerboard  EO;Intermittent UE support;Other (comment) 3 x 1 min; Pt maintained balance with no UE assist ~ 50 sec    Gait with Head Turns  Forward;4 reps 30 ft each rep with horizontal  head turn every 5 feet    Other Standing Exercises  Amb around clinic searching for playing cards in numerical order 2 reps x 90 ft. First rep pt missed 1 card, 2nd rep pt did not miss any cards but did required cueing to find 2 cards      Balance Exercises: Standing   Standing Eyes Closed Limitations  AMB 3 x 50 ft with eyes closed. Pt unable to stay in 12" walking path  with activity and was cued to open eyes x 2 to prevent running into obstacle.           PT Short Term Goals - 11/05/17 1146      PT SHORT TERM GOAL #1   Title  Pt will be independent with HEP    Status  Achieved        PT Long Term Goals - 11/24/17 1309      PT LONG TERM GOAL #1   Title  Pt will decrease normal TUG time by > 2 seconds to decrease risk for falls    Status  Achieved      PT LONG TERM GOAL #2   Title  Pt to improve gait speed to 3.0 ft/s to improve ability for home ambulation     Status  On-going      PT LONG TERM GOAL #3   Title  Pt will decrease 5xSTS time by > 4 seconds to improve ability to perform household activities for longer periods of time    Status  Achieved      PT LONG TERM GOAL #4   Title  Pt will report being able to stand while preparing meals for >/= 30 min without increased pain    Status  On-going      PT LONG TERM GOAL #5   Title  Pt will improve Berg score to 48/50 to improve ability to perform ADLs without LOB    Status  On-going            Plan - 11/29/17 1755    Clinical Impression Statement  Amber Yang continues to make progress with every visit but continues to display a significant amount of fatigue with activities. She also complains of shortness of breath, which is monitored throughout each exercise and is consistently > 95%. Today's session further focused on enhancing her ability to balance with perturbations and on uneven surfaces, recruiting core and hip musculature to maintain balance, as well as continuing to work on gait activities to challenge her ability to maintain balance when perturbed and when required to change head position. At this time she will continue to benefit from physical therapy to address these deficits, as well as continue to progress toward  functional goals.     Rehab Potential  Fair    Clinical Impairments Affecting Rehab Potential  Chronicity of other musculoskeletal pain areas    PT  Treatment/Interventions  ADLs/Self Care Home Management;Cryotherapy;Electrical Stimulation;Iontophoresis 4mg /ml Dexamethasone;Moist Heat;Traction;Gait training;Stair training;Functional mobility training;Therapeutic activities;Therapeutic exercise;Patient/family education;Neuromuscular re-education;Balance training;Manual techniques;DME Instruction;Vasopneumatic Device    Consulted and Agree with Plan of Care  Patient    Family Member Consulted  daughter       Patient will benefit from skilled therapeutic intervention in order to improve the following deficits and impairments:  Abnormal gait, Decreased activity tolerance, Decreased balance, Decreased knowledge of use of DME, Decreased safety awareness, Decreased range of motion, Decreased skin integrity, Decreased strength, Difficulty walking, Postural dysfunction, Improper body mechanics, Pain  Visit Diagnosis: Unsteadiness on feet  Cervicalgia  Muscle weakness (generalized)  Abnormal posture     Problem List Patient Active Problem List   Diagnosis Date Noted  . Shoulder tendinitis 10/05/2014  . Postoperative anemia due to acute blood loss 11/23/2013    Class: Acute  . Spinal stenosis, lumbar region, with neurogenic claudication 11/21/2013    Mikal Plane, SPT 11/29/2017, 7:36 PM  [facility] [address]  [address] [place], [place], [address] Phone: [phone]   Fax:  [phone]  Name: Amber Yang MRN: [id number] Date of Birth: 11/19/1930

## 2017-12-01 ENCOUNTER — Encounter: Payer: Medicare Other | Admitting: Physical Therapy

## 2017-12-06 ENCOUNTER — Encounter: Payer: Self-pay | Admitting: Physical Therapy

## 2017-12-06 ENCOUNTER — Ambulatory Visit: Payer: Medicare Other | Admitting: Physical Therapy

## 2017-12-06 DIAGNOSIS — M542 Cervicalgia: Secondary | ICD-10-CM

## 2017-12-06 DIAGNOSIS — R2681 Unsteadiness on feet: Secondary | ICD-10-CM | POA: Diagnosis not present

## 2017-12-06 DIAGNOSIS — R293 Abnormal posture: Secondary | ICD-10-CM

## 2017-12-06 DIAGNOSIS — M6281 Muscle weakness (generalized): Secondary | ICD-10-CM

## 2017-12-06 NOTE — Therapy (Signed)
[facility] Outpatient Rehabilitation [facility] [address]  [address] [place], [place], [address] Phone: [phone]   Fax:  [phone]  Physical Therapy Treatment  Patient Details  Name: Amber Yang MRN: [id number] Date of Birth: January 04, 1931 Referring Provider: Vira Browns, MD   Encounter Date: 12/06/2017  PT End of Session - 12/06/17 1437    Visit Number  8    Number of Visits  9    Date for PT Re-Evaluation  01/03/18    Authorization Type  Medicare    PT Start Time  1400    PT Stop Time  1441    PT Time Calculation (min)  41 min    Equipment Utilized During Treatment  Gait belt    Activity Tolerance  Patient tolerated treatment well    Behavior During Therapy  [facility] for tasks assessed/performed       Past Medical History:  Diagnosis Date  . Anxiety   . Asthma   . Constipation   . COPD (chronic obstructive pulmonary disease) (HCC)   . COPD, severe (HCC)   . Coronary artery disease   . DDD (degenerative disc disease)   . GERD (gastroesophageal reflux disease)   . H/O echocardiogram 01/27/2013  . Hearing loss   . History of colon polyps   . History of nuclear stress test 12/20/2012  . Hyperlipemia, mixed   . Hypertension   . Hypothyroidism   . Mitral valve regurgitation   . Nocturnal leg cramps   . Peripheral vascular disease (HCC)    followed by Dr Sondra Come  . Rheumatoid arthritis (HCC)   . Shortness of breath    exertional  . UTI (urinary tract infection) 05/2013    Past Surgical History:  Procedure Laterality Date  . ABDOMINAL HYSTERECTOMY    . CAROTID ENDARTERECTOMY Right 05/2013  . CATARACT EXTRACTION, BILATERAL    . CERVICAL FUSION  2012  . COLONOSCOPY W/ POLYPECTOMY  2008  . EYE SURGERY    . FOOT SURGERY Right   . VASCULAR SURGERY      There were no vitals filed for this visit.  Subjective Assessment - 12/06/17 1450    Subjective  Pt reports that she is doing better today and that her grandson is out of the hospital. She reports  no new concerns about herself today.     Patient is accompained by:  Family member   Daughter   Pertinent History  Hx of spinal surgeries    Limitations  Standing;Walking;House hold activities    How long can you stand comfortably?  Long enough to cook a meal or do housework    Patient Stated Goals  decrease pain, improve walking, improve balance     Currently in Pain?  No/denies                       [facility] Adult PT Treatment/Exercise - 12/06/17 0001      Exercises   Exercises  Lumbar;Knee/Hip      Lumbar Exercises: Aerobic   Nustep  L6 x 6 min (LE only)      Knee/Hip Exercises: Standing   Forward Step Up  20 reps;Hand Hold: 0;Both    Forward Step Up Limitations  Toe tapping onto 9" step in front of patient. VC for increased tempo. With fatigue pt demonstrated decreased foot clearance      Knee/Hip Exercises: Seated   Marching  20 reps;Both    Marching Limitations  Alternating each LE,  toe tapping onto 9" step           Balance Exercises - 12/06/17 1552      Balance Exercises: Standing   Standing Eyes Opened  Foam/compliant surface;1 rep;30 secs    Stepping Strategy  Anterior;UE support    Turning  Left;Both;10 reps;Limitations    Other Standing Exercises  Stepping strategy: Using dots for spacing. 4 x 4 reps with 5 sec SLS with intermittent UE support during activity. Progressed to using 13 dots placed on the floor to increase pt step length, with cones turned on their side between each dot as obstacles to step over to improve step clearance. 8 x 13 reps. Pt required VC for L LE to go over obstacles instead of around      Balance Exercises: Standing   Standing Eyes Opened Limitations  On AirEx Pad with manual perterbations from PT in all directions. Pt demonstrated increased difficulty when perturbed to the posterior direction. Pt required intervention to prevent LOB 2 times in the posterior direction    Turning Limitations  Standing on AirEx Pad rotating to  each side while holding Red TB with both UEs at navel height. PT provided VC for hip rotation with movement         PT Education - 12/06/17 1453    Education Details  Instructed patient to bring any concerns about ending POC to next visit so they can be addressed.     Person(s) Educated  Patient;Child(ren)   Daughter   Methods  Explanation;Demonstration    Comprehension  Verbalized understanding;Returned demonstration       PT Short Term Goals - 11/05/17 1146      PT SHORT TERM GOAL #1   Title  Pt will be independent with HEP    Status  Achieved        PT Long Term Goals - 11/24/17 1309      PT LONG TERM GOAL #1   Title  Pt will decrease normal TUG time by > 2 seconds to decrease risk for falls    Status  Achieved      PT LONG TERM GOAL #2   Title  Pt to improve gait speed to 3.0 ft/s to improve ability for home ambulation     Status  On-going      PT LONG TERM GOAL #3   Title  Pt will decrease 5xSTS time by > 4 seconds to improve ability to perform household activities for longer periods of time    Status  Achieved      PT LONG TERM GOAL #4   Title  Pt will report being able to stand while preparing meals for >/= 30 min without increased pain    Status  On-going      PT LONG TERM GOAL #5   Title  Pt will improve Berg score to 48/50 to improve ability to perform ADLs without LOB    Status  On-going            Plan - 12/06/17 1552    Clinical Impression Statement  Amber Yang continues to make progress through this episode of care, and demonstrates more tolerance and ability to perform SLS for longer periods of time. When cued she is able to increase her step clearance to step over obstacles, but does demonstrate more difficulty with foot clearance with the L LE. She has improved in her ability to maintain her balance with external perturbations, but still has difficulty when perturbed in the posterior  direction.     Rehab Potential  Fair    PT Treatment/Interventions   ADLs/Self Care Home Management;Cryotherapy;Electrical Stimulation;Iontophoresis 4mg /ml Dexamethasone;Moist Heat;Traction;Gait training;Stair training;Functional mobility training;Therapeutic activities;Therapeutic exercise;Patient/family education;Neuromuscular re-education;Balance training;Manual techniques;DME Instruction;Vasopneumatic Device    Consulted and Agree with Plan of Care  Patient    Family Member Consulted  daughter       Patient will benefit from skilled therapeutic intervention in order to improve the following deficits and impairments:  Abnormal gait, Decreased activity tolerance, Decreased balance, Decreased knowledge of use of DME, Decreased safety awareness, Decreased range of motion, Decreased skin integrity, Decreased strength, Difficulty walking, Postural dysfunction, Improper body mechanics, Pain  Visit Diagnosis: Unsteadiness on feet  Cervicalgia  Muscle weakness (generalized)  Abnormal posture     Problem List Patient Active Problem List   Diagnosis Date Noted  . Shoulder tendinitis 10/05/2014  . Postoperative anemia due to acute blood loss 11/23/2013    Class: Acute  . Spinal stenosis, lumbar region, with neurogenic claudication 11/21/2013    11/23/2013, SPT  12/06/2017, 4:15 PM  [facility] [address]  [address] [place], [place], [place] Phone: [phone]   Fax:  [phone]  Name: Amber Yang MRN: Vilma Meckel Date of Birth: 1931-04-22

## 2017-12-08 ENCOUNTER — Ambulatory Visit: Payer: Medicare Other | Admitting: Physical Therapy

## 2017-12-09 ENCOUNTER — Encounter: Payer: Self-pay | Admitting: Physical Therapy

## 2017-12-09 ENCOUNTER — Ambulatory Visit: Payer: Medicare Other | Admitting: Physical Therapy

## 2017-12-09 DIAGNOSIS — R2681 Unsteadiness on feet: Secondary | ICD-10-CM

## 2017-12-09 DIAGNOSIS — R293 Abnormal posture: Secondary | ICD-10-CM

## 2017-12-09 DIAGNOSIS — M6281 Muscle weakness (generalized): Secondary | ICD-10-CM

## 2017-12-09 DIAGNOSIS — M542 Cervicalgia: Secondary | ICD-10-CM

## 2017-12-09 NOTE — Therapy (Signed)
[facility] [address]  [place], [place], [address] Phone: [phone]   Fax:  [phone]  Physical Therapy Treatment  Patient Details  Name: Amber Yang MRN: [id number] Date of Birth: [birth year] Referring Provider: Basil Dess, MD   Encounter Date: 12/09/2017  PT End of Session - 12/09/17 1539    Visit Number  9    Number of Visits  9    Date for PT Re-Evaluation  01/03/18    Authorization Type  Medicare    PT Start Time  1537    PT Stop Time  1613    PT Time Calculation (min)  36 min    Activity Tolerance  Patient tolerated treatment well    Behavior During Therapy  [facility] for tasks assessed/performed       Past Medical History:  Diagnosis Date  . Anxiety   . Asthma   . Constipation   . COPD (chronic obstructive pulmonary disease) ([place])   . COPD, severe ([place])   . Coronary artery disease   . DDD (degenerative disc disease)   . GERD (gastroesophageal reflux disease)   . H/O echocardiogram 01/27/2013  . Hearing loss   . History of colon polyps   . History of nuclear stress test 12/20/2012  . Hyperlipemia, mixed   . Hypertension   . Hypothyroidism   . Mitral valve regurgitation   . Nocturnal leg cramps   . Peripheral vascular disease ([place])    followed by Dr Maryjean Morn  . Rheumatoid arthritis ([place])   . Shortness of breath    exertional  . UTI (urinary tract infection) 05/2013    Past Surgical History:  Procedure Laterality Date  . ABDOMINAL HYSTERECTOMY    . CAROTID ENDARTERECTOMY Right 05/2013  . CATARACT EXTRACTION, BILATERAL    . CERVICAL FUSION  2012  . COLONOSCOPY W/ POLYPECTOMY  2008  . EYE SURGERY    . FOOT SURGERY Right   . VASCULAR SURGERY      There were no vitals filed for this visit.      [facility] PT Assessment - 12/09/17 0001      Ambulation/Gait   Gait velocity  3.27 ft/s      Standardized Balance Assessment   10 Meter Walk  9.88,10.19      Berg Balance Test   Sit to Stand   Able to stand without using hands and stabilize independently    Standing Unsupported  Able to stand safely 2 minutes    Sitting with Back Unsupported but Feet Supported on Floor or Stool  Able to sit safely and securely 2 minutes    Stand to Sit  Sits safely with minimal use of hands    Transfers  Able to transfer safely, minor use of hands    Standing Unsupported with Eyes Closed  Able to stand 10 seconds safely    Standing Ubsupported with Feet Together  Able to place feet together independently and stand 1 minute safely    From Standing, Reach Forward with Outstretched Arm  Can reach confidently >25 cm (10")    From Standing Position, Pick up Object from Floor  Able to pick up shoe safely and easily    From Standing Position, Turn to Look Behind Over each Shoulder  Turn sideways only but maintains balance    Turn 360 Degrees  Able to turn 360 degrees safely but slowly    Standing Unsupported, Alternately Place Feet on Step/Stool  Able to stand  independently and complete 8 steps >20 seconds    Standing Unsupported, One Foot in [place] to plae foot ahead of the other independently and hold 30 seconds    Standing on One Leg  Tries to lift leg/unable to hold 3 seconds but remains standing independently    Total Score  47                        Balance Exercises - 12/09/17 1821      OTAGO PROGRAM   Hip ABductor  10 reps   L LE only   Ankle Plantorflexors  20 reps, support    Ankle Dorsiflexors  20 reps, support    Knee Bends  10 reps, support    Backwards Walking  Support    Tandem Walk  Support    One Leg Stand  10 seconds, support    Heel Toe Walking Backward  --   Support       PT Education - 12/09/17 1824    Education Details  Pt instructed to continue with [place] exercises for HEP and for maintanance of functional gains made throughout episode of care    Person(s) Educated  Patient    Methods  Explanation;Demonstration    Comprehension  Verbalized  understanding;Returned demonstration       PT Short Term Goals - 11/05/17 1146      PT SHORT TERM GOAL #1   Title  Pt will be independent with HEP    Status  Achieved        PT Long Term Goals - 12/09/17 1556      PT LONG TERM GOAL #1   Title  Pt will decrease normal TUG time by > 2 seconds to decrease risk for falls    Status  Achieved      PT LONG TERM GOAL #2   Title  Pt to improve gait speed to 3.0 ft/s to improve ability for home ambulation     Status  Achieved      PT LONG TERM GOAL #3   Title  Pt will decrease 5xSTS time by > 4 seconds to improve ability to perform household activities for longer periods of time    Status  Achieved      PT LONG TERM GOAL #4   Title  Pt will report being able to stand while preparing meals for >/= 30 min without increased pain    Status  Achieved      PT LONG TERM GOAL #5   Title  Pt will improve Berg score to 48/50 to improve ability to perform ADLs without LOB    Status  Not Met   47/50           Plan - 12/09/17 1830    Clinical Impression Statement  Mackinley has made great functional gains throughout this episode of therapy, and has met or partially met all of her physical therapy goals. Her gait speeds has improved by .83 ft/s since the start of this episode of care, and her Berg Balance score has increased by 4 points. In previous visits her TUG times have improved significantly, and her normal TUG times does not place her at a significant risk for falls. At this time pt will be d/c to HEP program, and was instructed to obtain a new referral if she feels that she experiences a significant change in status.     Rehab Potential  Fair    PT  Treatment/Interventions  ADLs/Self Care Home Management;Cryotherapy;Electrical Stimulation;Iontophoresis 65m/ml Dexamethasone;Moist Heat;Traction;Gait training;Stair training;Functional mobility training;Therapeutic activities;Therapeutic exercise;Patient/family education;Neuromuscular  re-education;Balance training;Manual techniques;DME Instruction;Vasopneumatic Device    Consulted and Agree with Plan of Care  Patient       Patient will benefit from skilled therapeutic intervention in order to improve the following deficits and impairments:  Abnormal gait, Decreased activity tolerance, Decreased balance, Decreased knowledge of use of DME, Decreased safety awareness, Decreased range of motion, Decreased skin integrity, Decreased strength, Difficulty walking, Postural dysfunction, Improper body mechanics, Pain  Visit Diagnosis: Unsteadiness on feet  Cervicalgia  Muscle weakness (generalized)  Abnormal posture     Problem List Patient Active Problem List   Diagnosis Date Noted  . Shoulder tendinitis 10/05/2014  . Postoperative anemia due to acute blood loss 11/23/2013    Class: Acute  . Spinal stenosis, lumbar region, with neurogenic claudication 11/21/2013    JShirline Frees SPT   12/09/2017, 6:46 PM  [facility] [place] [address]Phone: [phone]  Fax:  [phone] Name: LNAYZETH ALTMANMRN: [id number] of Birth: [birth year]  PHYSICAL THERAPY DISCHARGE SUMMARY  Visits from Start of Care: 9  Current functional level related to goals / functional outcomes: See clinical impression    Remaining deficits: See clinical impression   [facility] Plan: Patient agrees to discharge.  Patient goals were mostly met. Patient is being discharged due to meeting the stated rehab goals.  ?????      JShirline Frees SPT 12/09/17, 6:46 PM

## 2017-12-11 ENCOUNTER — Other Ambulatory Visit (INDEPENDENT_AMBULATORY_CARE_PROVIDER_SITE_OTHER): Payer: Self-pay | Admitting: Specialist

## 2017-12-13 NOTE — Telephone Encounter (Signed)
Baclofen refill request 

## 2017-12-25 ENCOUNTER — Other Ambulatory Visit (INDEPENDENT_AMBULATORY_CARE_PROVIDER_SITE_OTHER): Payer: Self-pay | Admitting: Specialist

## 2017-12-28 NOTE — Telephone Encounter (Signed)
Bac;ofen refill request

## 2018-01-25 ENCOUNTER — Other Ambulatory Visit (INDEPENDENT_AMBULATORY_CARE_PROVIDER_SITE_OTHER): Payer: Self-pay | Admitting: Specialist

## 2018-01-25 NOTE — Telephone Encounter (Signed)
Baclofen refill request 

## 2018-02-18 ENCOUNTER — Ambulatory Visit (INDEPENDENT_AMBULATORY_CARE_PROVIDER_SITE_OTHER): Payer: Medicare Other | Admitting: Specialist

## 2018-02-21 ENCOUNTER — Ambulatory Visit (INDEPENDENT_AMBULATORY_CARE_PROVIDER_SITE_OTHER): Payer: Medicare Other | Admitting: Specialist

## 2018-02-21 ENCOUNTER — Encounter (INDEPENDENT_AMBULATORY_CARE_PROVIDER_SITE_OTHER): Payer: Self-pay | Admitting: Specialist

## 2018-02-21 VITALS — BP 118/60 | HR 68 | Ht 61.0 in | Wt 131.0 lb

## 2018-02-21 DIAGNOSIS — M5441 Lumbago with sciatica, right side: Secondary | ICD-10-CM

## 2018-02-21 DIAGNOSIS — M205X1 Other deformities of toe(s) (acquired), right foot: Secondary | ICD-10-CM | POA: Diagnosis not present

## 2018-02-21 DIAGNOSIS — M2011 Hallux valgus (acquired), right foot: Secondary | ICD-10-CM | POA: Diagnosis not present

## 2018-02-21 DIAGNOSIS — M48062 Spinal stenosis, lumbar region with neurogenic claudication: Secondary | ICD-10-CM | POA: Diagnosis not present

## 2018-02-21 DIAGNOSIS — M21611 Bunion of right foot: Secondary | ICD-10-CM | POA: Diagnosis not present

## 2018-02-21 DIAGNOSIS — M5136 Other intervertebral disc degeneration, lumbar region: Secondary | ICD-10-CM

## 2018-02-21 MED ORDER — BACLOFEN 10 MG PO TABS: ORAL_TABLET | ORAL | 6 refills | Status: DC

## 2018-02-21 MED ORDER — GABAPENTIN 100 MG PO CAPS: 100.0000 mg | ORAL_CAPSULE | Freq: Every day | ORAL | 6 refills | Status: DC

## 2018-02-21 NOTE — Patient Instructions (Signed)
Avoid bending, stooping and avoid lifting weights greater than 10 lbs. Avoid prolong standing and walking. Avoid frequent bending and stooping  No lifting greater than 10 lbs. May use ice or moist heat for pain. Weight loss is of benefit. Handicap license is approved. Appointment to see Dr. Lajoyce Corners for evaluation and treatment of right foot bunion and right second toe overlapping.

## 2018-02-21 NOTE — Progress Notes (Signed)
Office Visit Note   Patient: Amber Yang           Date of Birth: [birth year]           MRN: [id number] Visit Date: 02/21/2018              Requested by: Amelia Jo, FNP [address] [place], [place] [address] PCP: Amelia Jo, FNP   Assessment & Plan: Visit Diagnoses:  1. Hallux valgus with bunions, right   2. Overlapping toe, acquired, right     Plan: Avoid bending, stooping and avoid lifting weights greater than 10 lbs. Avoid prolong standing and walking. Avoid frequent bending and stooping  No lifting greater than 10 lbs. May use ice or moist heat for pain. Weight loss is of benefit. Handicap license is approved. Appointment to see Dr. Lajoyce Corners for evaluation and treatment of right foot bunion and right second toe overlapping. Follow-Up Instructions: Return in about 2 weeks (around 03/07/2018) for Please make an  appointment for Dr. Lajoyce Corners for the right  foot  bunion, and overlapping 2nd toe..   Orders:  No orders of the defined types were placed in this encounter.  No orders of the defined types were placed in this encounter.     Procedures: No procedures performed   Clinical Data: No additional findings.   Subjective: Chief Complaint  Patient presents with  . Lower Back - Follow-up    84 year old female with arthritis and neuropathy. She underwent surgery right foot and had right foot nerve damage with persistent numbness over the right great toe and second toe. She has had a recent change in her primary care MD Dr.Rainwater was let go  The clinic she was going to moving to an area closer to the [employer]. She was seen previously at the [facility] urgent care clinic. Now with pain in the right foot with right 2nd toe overlapping a right great toe bunion..   Review of Systems  Constitutional: Negative.   HENT: Negative.   Eyes: Negative.   Respiratory: Negative.   Cardiovascular: Negative.   Gastrointestinal: Negative.   Endocrine:  Negative.   Genitourinary: Negative.   Musculoskeletal: Negative.   Skin: Negative.   Allergic/Immunologic: Negative.   Neurological: Negative.   Hematological: Negative.   Psychiatric/Behavioral: Negative.      Objective: Vital Signs: Ht 5\' 1"  (1.549 m)   Wt 131 lb (59.4 kg)   BMI 24.75 kg/m   Physical Exam  Constitutional: She is oriented to person, place, and time. She appears well-developed and well-nourished.  HENT:  Head: Normocephalic and atraumatic.  Eyes: Pupils are equal, round, and reactive to light. EOM are normal.  Neck: Normal range of motion. Neck supple.  Pulmonary/Chest: Effort normal and breath sounds normal.  Abdominal: Soft. Bowel sounds are normal.  Musculoskeletal: Normal range of motion.  Neurological: She is alert and oriented to person, place, and time.  Skin: Skin is warm and dry.  Psychiatric: She has a normal mood and affect. Her behavior is normal. Judgment and thought content normal.    Back Exam   Tenderness  The patient is experiencing tenderness in the lumbar.  Range of Motion  Extension: normal  Flexion: normal  Lateral bend right: normal  Lateral bend left: normal  Rotation right: normal  Rotation left: normal   Tests  Straight leg raise right: negative Straight leg raise left: negative  Reflexes  Patellar: Hyporeflexic Achilles: Hyporeflexic Babinski's sign: normal   Other  Toe walk: normal Heel walk: normal Sensation: normal Gait: normal  Erythema: back redness Scars: present      Specialty Comments:  No specialty comments available.  Imaging: No results found.   PMFS History: Patient Active Problem List   Diagnosis Date Noted  . Postoperative anemia due to acute blood loss 11/23/2013    Priority: Low    Class: Acute  . Shoulder tendinitis 10/05/2014  . Spinal stenosis, lumbar region, with neurogenic claudication 11/21/2013   Past Medical History:  Diagnosis Date  . Anxiety   . Asthma   .  Constipation   . COPD (chronic obstructive pulmonary disease) (HCC)   . COPD, severe (HCC)   . Coronary artery disease   . DDD (degenerative disc disease)   . GERD (gastroesophageal reflux disease)   . H/O echocardiogram 01/27/2013  . Hearing loss   . History of colon polyps   . History of nuclear stress test 12/20/2012  . Hyperlipemia, mixed   . Hypertension   . Hypothyroidism   . Mitral valve regurgitation   . Nocturnal leg cramps   . Peripheral vascular disease (HCC)    followed by Dr Sondra Come  . Rheumatoid arthritis (HCC)   . Shortness of breath    exertional  . UTI (urinary tract infection) 05/2013    No family history on file.  Past Surgical History:  Procedure Laterality Date  . ABDOMINAL HYSTERECTOMY    . CAROTID ENDARTERECTOMY Right 05/2013  . CATARACT EXTRACTION, BILATERAL    . CERVICAL FUSION  2012  . COLONOSCOPY W/ POLYPECTOMY  2008  . EYE SURGERY    . FOOT SURGERY Right   . VASCULAR SURGERY     Social History   Occupational History  . Not on file  Tobacco Use  . Smoking status: Former Smoker    Packs/day: 1.00    Years: 40.00    Pack years: 40.00    Types: Cigarettes    Last attempt to quit: 11/15/2003    Years since quitting: 14.2  . Smokeless tobacco: Never Used  Substance and Sexual Activity  . Alcohol use: No  . Drug use: No  . Sexual activity: Never

## 2018-03-07 ENCOUNTER — Ambulatory Visit (INDEPENDENT_AMBULATORY_CARE_PROVIDER_SITE_OTHER): Payer: Medicare Other | Admitting: Orthopedic Surgery

## 2018-03-07 ENCOUNTER — Encounter (INDEPENDENT_AMBULATORY_CARE_PROVIDER_SITE_OTHER): Payer: Self-pay | Admitting: Orthopedic Surgery

## 2018-03-07 VITALS — Ht 61.0 in | Wt 131.0 lb

## 2018-03-07 DIAGNOSIS — M21611 Bunion of right foot: Secondary | ICD-10-CM | POA: Diagnosis not present

## 2018-03-07 DIAGNOSIS — M205X1 Other deformities of toe(s) (acquired), right foot: Secondary | ICD-10-CM

## 2018-03-07 NOTE — Progress Notes (Signed)
Office Visit Note   Patient: Amber Yang           Date of Birth: 04/04/1931           MRN: [id number] Visit Date: 03/07/2018              Requested by: Amelia Jo, FNP [address] [place], [place] [address] PCP: Amelia Jo, FNP  Chief Complaint  Patient presents with  . Right Foot - Pain, Follow-up    Right foot bunion and overlapping 2nd toe      HPI: Patient is an 84 year old woman with painful clawing of the second toe with pain with shoe wear over the PIP joint painful bunion deformity as well as ulcer on the great toe where it is rubbing on the third toe.  Patient states she is status post lesser toe surgeries years ago.  Patient has pain with shoe wear.  Patient states she is on Eliquis for atrial fibrillation.  Assessment & Plan: Visit Diagnoses:  1. Bunion of great toe of right foot   2. Claw toe, acquired, right     Plan: Discussed with the patient modifying her shoes to prevent pressure dorsally or wearing open toed shoe could be helpful.  Discussed that she is an increased risk of surgery and that just resecting the second toe would not be sufficient since she already has an ulcer from where the great toe rubs on the third toe.  Discussed surgical option would include a chevron osteotomy of the first metatarsal Weil osteotomy the second metatarsal and a PIP resection of the second toe.  Discussed that she is at increased risk of infection, nonhealing of the wounds that she would need to be off her foot for 2 weeks somebody would have to stay with her while she is recovering from surgery.  Discussed the potential for amputation of the toes if the incisions do not heal.  Patient states she understands she will call us if she wants to proceed with surgery we would need to obtain an x-ray of the right foot prior to surgery.  Outpatient surgery at [facility] she has had other surgeries at [facility].  Follow-Up Instructions: Return if symptoms worsen or fail to  improve.   Ortho Exam  Patient is alert, oriented, no adenopathy, well-dressed, normal affect, normal respiratory effort. Examination patient has a strong dorsalis pedis and posterior tibial pulse her foot is plantigrade she has no plantar ulcers there are no ulcers dorsally over the toe she does have an ulcer on the great toe where it is rubbing on the third toe.  She has fixed clawing of the second toe which sits on top of the great toe.  She does not have hallux rigidus there is no pain with range of motion of the great toe there is hallux valgus and fixed clawing of the second toe.  There is previous a resection of the PIP joints of the toes 3 and 4.  Imaging: No results found. No images are attached to the encounter.  Labs: Lab Results  Component Value Date   ESRSEDRATE 8 10/01/2016   CRP 6.0 10/01/2016     Lab Results  Component Value Date   ALBUMIN 4.2 11/14/2013   ALBUMIN 4.1 09/10/2010    Body mass index is 24.75 kg/m.  Orders:  No orders of the defined types were placed in this encounter.  No orders of the defined types were placed in this encounter.  Procedures: No procedures performed  Clinical Data: No additional findings.  ROS:  All other systems negative, except as noted in the HPI. Review of Systems  Objective: Vital Signs: Ht 5\' 1"  (1.549 m)   Wt 131 lb (59.4 kg)   BMI 24.75 kg/m   Specialty Comments:  No specialty comments available.  PMFS History: Patient Active Problem List   Diagnosis Date Noted  . Shoulder tendinitis 10/05/2014  . Postoperative anemia due to acute blood loss 11/23/2013    Class: Acute  . Spinal stenosis, lumbar region, with neurogenic claudication 11/21/2013   Past Medical History:  Diagnosis Date  . Anxiety   . Asthma   . Constipation   . COPD (chronic obstructive pulmonary disease) (HCC)   . COPD, severe (HCC)   . Coronary artery disease   . DDD (degenerative disc disease)   . GERD (gastroesophageal  reflux disease)   . H/O echocardiogram 01/27/2013  . Hearing loss   . History of colon polyps   . History of nuclear stress test 12/20/2012  . Hyperlipemia, mixed   . Hypertension   . Hypothyroidism   . Mitral valve regurgitation   . Nocturnal leg cramps   . Peripheral vascular disease (HCC)    followed by Dr 12/22/2012  . Rheumatoid arthritis (HCC)   . Shortness of breath    exertional  . UTI (urinary tract infection) 05/2013    History reviewed. No pertinent family history.  Past Surgical History:  Procedure Laterality Date  . ABDOMINAL HYSTERECTOMY    . CAROTID ENDARTERECTOMY Right 05/2013  . CATARACT EXTRACTION, BILATERAL    . CERVICAL FUSION  2012  . COLONOSCOPY W/ POLYPECTOMY  2008  . EYE SURGERY    . FOOT SURGERY Right   . VASCULAR SURGERY     Social History   Occupational History  . Not on file  Tobacco Use  . Smoking status: Former Smoker    Packs/day: 1.00    Years: 40.00    Pack years: 40.00    Types: Cigarettes    Last attempt to quit: 11/15/2003    Years since quitting: 14.3  . Smokeless tobacco: Never Used  Substance and Sexual Activity  . Alcohol use: No  . Drug use: No  . Sexual activity: Never

## 2018-08-03 ENCOUNTER — Telehealth (INDEPENDENT_AMBULATORY_CARE_PROVIDER_SITE_OTHER): Payer: Self-pay | Admitting: Radiology

## 2018-08-03 NOTE — Telephone Encounter (Signed)
Patient returned a message to change 08/22/18 appointment with Dr. Otelia Sergeant. I did not see a note as when the appointment needed to be changed to. Please call patient back at [phone]

## 2018-08-04 NOTE — Telephone Encounter (Signed)
I called and clarified with her that her appt on 08/22/2018 is fine at this time, that we do not need to change it

## 2018-08-22 ENCOUNTER — Ambulatory Visit (INDEPENDENT_AMBULATORY_CARE_PROVIDER_SITE_OTHER): Payer: Medicare Other | Admitting: Specialist

## 2018-08-22 ENCOUNTER — Encounter (INDEPENDENT_AMBULATORY_CARE_PROVIDER_SITE_OTHER): Payer: Self-pay | Admitting: Specialist

## 2018-08-22 ENCOUNTER — Ambulatory Visit (INDEPENDENT_AMBULATORY_CARE_PROVIDER_SITE_OTHER): Payer: Self-pay

## 2018-08-22 ENCOUNTER — Other Ambulatory Visit: Payer: Self-pay

## 2018-08-22 VITALS — Ht 61.0 in | Wt 134.0 lb

## 2018-08-22 DIAGNOSIS — I6523 Occlusion and stenosis of bilateral carotid arteries: Secondary | ICD-10-CM | POA: Diagnosis not present

## 2018-08-22 DIAGNOSIS — M542 Cervicalgia: Secondary | ICD-10-CM

## 2018-08-25 ENCOUNTER — Encounter (INDEPENDENT_AMBULATORY_CARE_PROVIDER_SITE_OTHER): Payer: Self-pay | Admitting: Specialist

## 2018-08-25 NOTE — Progress Notes (Signed)
Office Visit Note   Patient: Amber Yang           Date of Birth: 1930/10/18           MRN: [id number] Visit Date: 08/22/2018              Requested by: Amelia Jo, FNP [address] [place], [place] [address] PCP: Amelia Jo, FNP   Assessment & Plan: Visit Diagnoses:  1. Cervicalgia   2. Bilateral carotid artery stenosis   3. Neck pain   productive cough.  Failed conservative from pulmonologist.  History of COPD  Plan: With patient's neck pain some of this could be coming from her degenerative changes that she has.  She has also had previous fusion C3-C6.  With the neck pain that she is currently describing on both sides and with her history of carotid stenosis and previous endarterectomy I recommend getting bilateral carotid ultrasounds.  Follow-up with Dr. Otelia Sergeant after completion to discuss results and further treatment options.  Also with her ongoing productive cough that has failed conservative treatment through her pulmonology clinic I did contact their office today to speak with a provider to see about getting patient an appointment for follow-up sooner than later.  I was not contacted by a provider.  My assistant also try to get her in and an appointment was offered for June 2020 which I did not think was reasonable.  Patient can also attempt to contact their office to see if she can get in sooner.  We will see how her neck is doing at return office visit.  Follow-Up Instructions: Return in about 4 weeks (around 09/19/2018).   Orders:  Orders Placed This Encounter  Procedures  . XR Cervical Spine 2 or 3 views  . US Carotid Bilateral   No orders of the defined types were placed in this encounter.     Procedures: No procedures performed   Clinical Data: No additional findings.   Subjective: Chief Complaint  Patient presents with  . Neck - Pain    HPI 85 year old white female comes in today with complaints of neck pain.  She is status post C3-C6  fusion several years ago.  She complains of neck pain on both sides.  She has a history of carotid stenosis and she reports having a right-sided carotid endarterectomy several years ago.  Not complaining of any lightheadedness dizziness headaches.  Denies upper extremity radicular symptoms.  No feeling of unsteadiness in gait.  She has had a chronic productive cough that has failed conservative treatment by her pulmonology PA Lysle Pearl and she was last seen by him July 05, 2018.  States that she has been treated with oral antibiotics, prednisone, inhalers.  She has a history of COPD.  She does not feel like she is getting any better.  She does not have an upcoming appointment scheduled with [facility] or other provider in that office.   Review of Systems Patient complains of chronic productive cough.  States that she has been on multiple medications including prednisone, oral antibiotics, and increase of her inhalers.  Dyspnea.  Admits to watery eyes.  No complaints of fever or chest pain.  Objective: Vital Signs: Ht 5\' 1"  (1.549 m)   Wt 134 lb (60.8 kg)   BMI 25.32 kg/m   Physical Exam HENT:     Head: Normocephalic.  Eyes:     Extraocular Movements: Extraocular movements intact.     Comments: Eyes are watery.  No purulence noted.  Neck:     Comments: She does have limitation with cervical flexion/extension.  Neck rotation does not cause pain.  Posterior neck nontender. Pulmonary:     Effort: No respiratory distress.  Skin:    General: Skin is warm and dry.  Neurological:     General: No focal deficit present.     Mental Status: She is alert.  Psychiatric:        Mood and Affect: Mood normal.     Ortho Exam  Specialty Comments:  No specialty comments available.  Imaging: No results found.   PMFS History: Patient Active Problem List   Diagnosis Date Noted  . Shoulder tendinitis 10/05/2014  . Postoperative anemia due to acute blood loss 11/23/2013    Class:  Acute  . Spinal stenosis, lumbar region, with neurogenic claudication 11/21/2013   Past Medical History:  Diagnosis Date  . Anxiety   . Asthma   . Constipation   . COPD (chronic obstructive pulmonary disease) (HCC)   . COPD, severe (HCC)   . Coronary artery disease   . DDD (degenerative disc disease)   . GERD (gastroesophageal reflux disease)   . H/O echocardiogram 01/27/2013  . Hearing loss   . History of colon polyps   . History of nuclear stress test 12/20/2012  . Hyperlipemia, mixed   . Hypertension   . Hypothyroidism   . Mitral valve regurgitation   . Nocturnal leg cramps   . Peripheral vascular disease (HCC)    followed by Dr Sondra Come  . Rheumatoid arthritis (HCC)   . Shortness of breath    exertional  . UTI (urinary tract infection) 05/2013    History reviewed. No pertinent family history.  Past Surgical History:  Procedure Laterality Date  . ABDOMINAL HYSTERECTOMY    . CAROTID ENDARTERECTOMY Right 05/2013  . CATARACT EXTRACTION, BILATERAL    . CERVICAL FUSION  2012  . COLONOSCOPY W/ POLYPECTOMY  2008  . EYE SURGERY    . FOOT SURGERY Right   . VASCULAR SURGERY     Social History   Occupational History  . Not on file  Tobacco Use  . Smoking status: Former Smoker    Packs/day: 1.00    Years: 40.00    Pack years: 40.00    Types: Cigarettes    Last attempt to quit: 11/15/2003    Years since quitting: 14.7  . Smokeless tobacco: Never Used  Substance and Sexual Activity  . Alcohol use: No  . Drug use: No  . Sexual activity: Never

## 2018-08-26 ENCOUNTER — Telehealth: Payer: Self-pay | Admitting: *Deleted

## 2018-08-26 ENCOUNTER — Encounter (HOSPITAL_COMMUNITY): Payer: Medicare Other

## 2018-08-26 NOTE — Telephone Encounter (Signed)
Received call from Crystal with [facility] stating she contacted pt to schedule her for today 08/26/18 at 11am and pt stated she had appts all this week and was not able to come in until Wednesday, pt is scheduled Wednesday 08/31/18 at 2p

## 2018-08-31 ENCOUNTER — Ambulatory Visit (HOSPITAL_COMMUNITY)
Admission: RE | Admit: 2018-08-31 | Discharge: 2018-08-31 | Disposition: A | Discharge status: Home / Self Care | Payer: Medicare Other | Source: Ambulatory Visit | Attending: Family | Admitting: Family

## 2018-08-31 ENCOUNTER — Other Ambulatory Visit: Payer: Self-pay

## 2018-08-31 DIAGNOSIS — M542 Cervicalgia: Secondary | ICD-10-CM | POA: Insufficient documentation

## 2018-08-31 DIAGNOSIS — I6523 Occlusion and stenosis of bilateral carotid arteries: Secondary | ICD-10-CM | POA: Insufficient documentation

## 2018-09-30 ENCOUNTER — Ambulatory Visit (INDEPENDENT_AMBULATORY_CARE_PROVIDER_SITE_OTHER): Payer: Medicare Other | Admitting: Specialist

## 2018-09-30 ENCOUNTER — Other Ambulatory Visit: Payer: Self-pay

## 2018-09-30 VITALS — BP 143/81 | HR 68 | Ht 61.0 in | Wt 134.0 lb

## 2018-09-30 DIAGNOSIS — I6523 Occlusion and stenosis of bilateral carotid arteries: Secondary | ICD-10-CM | POA: Diagnosis not present

## 2018-09-30 DIAGNOSIS — M1712 Unilateral primary osteoarthritis, left knee: Secondary | ICD-10-CM

## 2018-09-30 DIAGNOSIS — M1711 Unilateral primary osteoarthritis, right knee: Secondary | ICD-10-CM

## 2018-09-30 DIAGNOSIS — I878 Other specified disorders of veins: Secondary | ICD-10-CM

## 2018-09-30 DIAGNOSIS — M48062 Spinal stenosis, lumbar region with neurogenic claudication: Secondary | ICD-10-CM

## 2018-09-30 DIAGNOSIS — S51812S Laceration without foreign body of left forearm, sequela: Secondary | ICD-10-CM | POA: Diagnosis not present

## 2018-09-30 DIAGNOSIS — W19XXXA Unspecified fall, initial encounter: Secondary | ICD-10-CM

## 2018-09-30 DIAGNOSIS — Y92009 Unspecified place in unspecified non-institutional (private) residence as the place of occurrence of the external cause: Secondary | ICD-10-CM

## 2018-09-30 MED ORDER — DICLOFENAC SODIUM 1 % TD GEL: 4.0000 g | Freq: Four times a day (QID) | TRANSDERMAL | 3 refills | Status: DC

## 2018-09-30 NOTE — Patient Instructions (Addendum)
  Knee is suffering from osteoarthritis, only real proven treatments are Weight loss, medication like diclofenac gel and exercise. Well padded shoes help. Ice the knee 2-3 times a day 15-20 mins at a time. Use stockings for the legs to decrease vein swelling, support stockings help.  Diclofenac gel applied to the knees up to 4 times daily.

## 2018-09-30 NOTE — Progress Notes (Signed)
Office Visit Note   Patient: Amber Yang           Date of Birth: December 29, 1930           MRN: [id number] Visit Date: 09/30/2018              Requested by: Amelia Jo, FNP [address] [place], [place] [address] PCP: Amelia Jo, FNP   Assessment & Plan: Visit Diagnoses:  1. Fall as cause of accidental injury in home as place of occurrence, initial encounter   2. Spinal stenosis of lumbar region with neurogenic claudication   3. Venous congestion   4. Forearm laceration, left, sequela   5. Unilateral primary osteoarthritis, right knee   6. Unilateral primary osteoarthritis, left knee     Plan: Knee is suffering from osteoarthritis, only real proven treatments are Weight loss, medication like diclofenac gel and exercise. Well padded shoes help. Ice the knee 2-3 times a day 15-20 mins at a time. Use stockings for the legs to decrease vein swelling, support stockings help.  Diclofenac gel applied to the knees up to 4 times daily.  Follow-Up Instructions: Return in about 3 months (around 12/31/2018).   Orders:  No orders of the defined types were placed in this encounter.  No orders of the defined types were placed in this encounter.     Procedures: No procedures performed   Clinical Data: No additional findings.   Subjective: Chief Complaint  Patient presents with  . Neck - Follow-up    85 year old female with history of lumbar fusion and bilateral knee osteoarthritis. She fell about one month ago in the home at the laundry Room. She is able to stand and walk short distances. She is not having any worsening of knee pain and presently is stable. With the fall she had a skin tear in the left forarm with dressing changes. The dressing on the left forearm tends to stick to the skin.   Review of Systems  Constitutional: Negative.   HENT: Negative.   Eyes: Negative.   Respiratory: Negative.   Cardiovascular: Negative.   Gastrointestinal: Negative.    Endocrine: Negative.   Genitourinary: Negative.   Musculoskeletal: Negative.   Skin: Negative.   Allergic/Immunologic: Negative.   Neurological: Negative.   Hematological: Negative.   Psychiatric/Behavioral: Negative.      Objective: Vital Signs: BP (!) 143/81 (BP Location: Left Arm, Patient Position: Sitting)   Pulse 68   Ht 5\' 1"  (1.549 m)   Wt 134 lb (60.8 kg)   BMI 25.32 kg/m   Physical Exam  Ortho Exam  Specialty Comments:  No specialty comments available.  Imaging: No results found.   PMFS History: Patient Active Problem List   Diagnosis Date Noted  . Postoperative anemia due to acute blood loss 11/23/2013    Priority: Low    Class: Acute  . Shoulder tendinitis 10/05/2014  . Spinal stenosis, lumbar region, with neurogenic claudication 11/21/2013   Past Medical History:  Diagnosis Date  . Anxiety   . Asthma   . Constipation   . COPD (chronic obstructive pulmonary disease) (HCC)   . COPD, severe (HCC)   . Coronary artery disease   . DDD (degenerative disc disease)   . GERD (gastroesophageal reflux disease)   . H/O echocardiogram 01/27/2013  . Hearing loss   . History of colon polyps   . History of nuclear stress test 12/20/2012  . Hyperlipemia, mixed   . Hypertension   .  Hypothyroidism   . Mitral valve regurgitation   . Nocturnal leg cramps   . Peripheral vascular disease (HCC)    followed by Dr Sondra Come  . Rheumatoid arthritis (HCC)   . Shortness of breath    exertional  . UTI (urinary tract infection) 05/2013    No family history on file.  Past Surgical History:  Procedure Laterality Date  . ABDOMINAL HYSTERECTOMY    . CAROTID ENDARTERECTOMY Right 05/2013  . CATARACT EXTRACTION, BILATERAL    . CERVICAL FUSION  2012  . COLONOSCOPY W/ POLYPECTOMY  2008  . EYE SURGERY    . FOOT SURGERY Right   . VASCULAR SURGERY     Social History   Occupational History  . Not on file  Tobacco Use  . Smoking status: Former Smoker    Packs/day: 1.00     Years: 40.00    Pack years: 40.00    Types: Cigarettes    Last attempt to quit: 11/15/2003    Years since quitting: 14.8  . Smokeless tobacco: Never Used  Substance and Sexual Activity  . Alcohol use: No  . Drug use: No  . Sexual activity: Never

## 2018-10-05 ENCOUNTER — Encounter: Payer: Self-pay | Admitting: Specialist

## 2018-10-08 ENCOUNTER — Other Ambulatory Visit (INDEPENDENT_AMBULATORY_CARE_PROVIDER_SITE_OTHER): Payer: Self-pay | Admitting: Specialist

## 2018-10-10 NOTE — Telephone Encounter (Signed)
Baclofen refill request 

## 2018-11-10 ENCOUNTER — Other Ambulatory Visit: Payer: Self-pay | Admitting: Specialist

## 2018-11-11 NOTE — Telephone Encounter (Signed)
Baclofen refill request 

## 2018-12-12 ENCOUNTER — Other Ambulatory Visit: Payer: Self-pay | Admitting: Specialist

## 2018-12-12 NOTE — Telephone Encounter (Signed)
Please advise 

## 2019-01-04 ENCOUNTER — Ambulatory Visit (INDEPENDENT_AMBULATORY_CARE_PROVIDER_SITE_OTHER): Payer: Medicare Other | Admitting: Specialist

## 2019-01-04 ENCOUNTER — Other Ambulatory Visit: Payer: Self-pay

## 2019-01-04 ENCOUNTER — Encounter: Payer: Self-pay | Admitting: Specialist

## 2019-01-04 VITALS — BP 102/89 | HR 75 | Ht 61.0 in | Wt 134.0 lb

## 2019-01-04 DIAGNOSIS — M1712 Unilateral primary osteoarthritis, left knee: Secondary | ICD-10-CM | POA: Diagnosis not present

## 2019-01-04 DIAGNOSIS — L97929 Non-pressure chronic ulcer of unspecified part of left lower leg with unspecified severity: Secondary | ICD-10-CM

## 2019-01-04 DIAGNOSIS — I878 Other specified disorders of veins: Secondary | ICD-10-CM

## 2019-01-04 DIAGNOSIS — I83019 Varicose veins of right lower extremity with ulcer of unspecified site: Secondary | ICD-10-CM | POA: Diagnosis not present

## 2019-01-04 DIAGNOSIS — I83029 Varicose veins of left lower extremity with ulcer of unspecified site: Secondary | ICD-10-CM

## 2019-01-04 DIAGNOSIS — L97919 Non-pressure chronic ulcer of unspecified part of right lower leg with unspecified severity: Secondary | ICD-10-CM

## 2019-01-04 DIAGNOSIS — I6523 Occlusion and stenosis of bilateral carotid arteries: Secondary | ICD-10-CM

## 2019-01-04 DIAGNOSIS — M1711 Unilateral primary osteoarthritis, right knee: Secondary | ICD-10-CM

## 2019-01-04 NOTE — Progress Notes (Signed)
Office Visit Note   Patient: Amber Yang           Date of Birth: February 25, 1931           MRN: [id number] Visit Date: 01/04/2019              Requested by: Amelia Jo, FNP [address] [place],  [place] [address] PCP: Amelia Jo, FNP   Assessment & Plan: Visit Diagnoses:  1. Unilateral primary osteoarthritis, left knee   2. Unilateral primary osteoarthritis, right knee   3. Venous stasis of both lower extremities   4. Venous stasis ulcers of both lower extremities (HCC)     Plan:  Knee is suffering from osteoarthritis, only real proven treatments are Weight loss, Avoid NSIADs like alleve and motrin due to risk of kidney injury and perform exercise. Well padded shoes help. Ice the knee 2-3 times a day 15-20 mins at a time. Due to swelling and induratin of the medial leg both sides extending to the inner thigh I have ordered a doppler Vein study to rule out blood clot or DVT.   Follow-Up Instructions: No follow-ups on file.   Orders:  No orders of the defined types were placed in this encounter.  No orders of the defined types were placed in this encounter.     Procedures: No procedures performed   Clinical Data: No additional findings.   Subjective: Chief Complaint  Patient presents with  . Right Knee - Follow-up  . Left Knee - Follow-up    85 year old female one month post fall at home, grandson called 911 due her inability to get up, happened in the laundry room. She had her pocket book and she crawled to the dinning room table and couldn't get up. The fire dept came and applied a bandaid to the left forearm. She is standing and walking. She has bilateral leg venous stasis and is on lasix. Has some pain left medial knee with standing and walking. She has swelling into the legs and is seen at the wound care clinic for venous stasis ulcers both legs.    Review of Systems  Constitutional: Negative.   HENT: Negative.   Eyes: Negative.    Respiratory: Negative.   Cardiovascular: Positive for leg swelling.  Gastrointestinal: Negative.  Negative for abdominal distention, abdominal pain, anal bleeding, blood in stool, constipation, diarrhea, nausea, rectal pain and vomiting.  Endocrine: Negative.   Genitourinary: Negative.   Musculoskeletal: Positive for arthralgias, back pain and gait problem.  Skin: Negative.  Negative for color change, pallor, rash and wound.  Allergic/Immunologic: Negative.   Neurological: Negative for dizziness, tremors, seizures, syncope, facial asymmetry, speech difficulty, weakness, light-headedness, numbness and headaches.  Hematological: Negative.   Psychiatric/Behavioral: Negative.  Negative for agitation, behavioral problems, confusion, decreased concentration, dysphoric mood, hallucinations, self-injury, sleep disturbance and suicidal ideas. The patient is not nervous/anxious and is not hyperactive.      Objective: Vital Signs: BP 102/89 (BP Location: Left Arm, Patient Position: Sitting)   Pulse 75   Ht 5\' 1"  (1.549 m)   Wt 134 lb (60.8 kg)   BMI 25.32 kg/m   Physical Exam Musculoskeletal:     Left knee: She exhibits no effusion.     Left Knee Exam   Muscle Strength  The patient has normal left knee strength.  Tenderness  The patient is experiencing tenderness in the medial joint line and pes anserinus.  Range of Motion  Extension:  -5 abnormal  Flexion: 120   Tests  McMurray:  Medial - negative Lateral - negative Varus: negative  Lachman:  Anterior - negative    Posterior - negative Drawer:  Anterior - negative     Posterior - negative Pivot shift: negative Patellar apprehension: negative  Other  Erythema: absent Scars: absent Sensation: normal Pulse: present Swelling: severe Effusion: no effusion present  Comments:  Induration of the medial leg, knee and thigh bilateral, bilateral leg wraps to the knee.       Specialty Comments:  No specialty comments  available.  Imaging: No results found.   PMFS History: Patient Active Problem List   Diagnosis Date Noted  . Postoperative anemia due to acute blood loss 11/23/2013    Priority: Low    Class: Acute  . Shoulder tendinitis 10/05/2014  . Spinal stenosis, lumbar region, with neurogenic claudication 11/21/2013   Past Medical History:  Diagnosis Date  . Anxiety   . Asthma   . Constipation   . COPD (chronic obstructive pulmonary disease) ([place])   . COPD, severe ([place])   . Coronary artery disease   . DDD (degenerative disc disease)   . GERD (gastroesophageal reflux disease)   . H/O echocardiogram 01/27/2013  . Hearing loss   . History of colon polyps   . History of nuclear stress test 12/20/2012  . Hyperlipemia, mixed   . Hypertension   . Hypothyroidism   . Mitral valve regurgitation   . Nocturnal leg cramps   . Peripheral vascular disease ([place])    followed by Dr Maryjean Morn  . Rheumatoid arthritis ([place])   . Shortness of breath    exertional  . UTI (urinary tract infection) 05/2013    History reviewed. No pertinent family history.  Past Surgical History:  Procedure Laterality Date  . ABDOMINAL HYSTERECTOMY    . CAROTID ENDARTERECTOMY Right 05/2013  . CATARACT EXTRACTION, BILATERAL    . CERVICAL FUSION  2012  . COLONOSCOPY W/ POLYPECTOMY  2008  . EYE SURGERY    . FOOT SURGERY Right   . VASCULAR SURGERY     Social History   Occupational History  . Not on file  Tobacco Use  . Smoking status: Former Smoker    Packs/day: 1.00    Years: 40.00    Pack years: 40.00    Types: Cigarettes    Quit date: 11/15/2003    Years since quitting: 15.1  . Smokeless tobacco: Never Used  Substance and Sexual Activity  . Alcohol use: No  . Drug use: No  . Sexual activity: Never

## 2019-01-04 NOTE — Patient Instructions (Signed)
  Knee is suffering from osteoarthritis, only real proven treatments are Weight loss, Avoid NSIADs like alleve and motrin due to risk of kidney injury and perform exercise. Well padded shoes help. Ice the knee 2-3 times a day 15-20 mins at a time. Due to swelling and induratin of the medial leg both sides extending to the inner thigh I have ordered a doppler Vein study to rule out blood clot or DVT.

## 2019-01-12 ENCOUNTER — Other Ambulatory Visit: Payer: Self-pay | Admitting: Specialist

## 2019-01-13 NOTE — Telephone Encounter (Signed)
Left VM advising patient that her Rx was sent to her pharmacy

## 2019-01-23 ENCOUNTER — Telehealth: Payer: Self-pay | Admitting: Specialist

## 2019-01-23 NOTE — Telephone Encounter (Signed)
Patient left a voicemail message stating that she needed to talk to you in regards to an [place] that Dr. Louanne Skye was requesting for her.  CB#[phone].  Thank you.

## 2019-01-23 NOTE — Telephone Encounter (Signed)
Patient wants to cancel the Doppler that was ordered by Dr. Louanne Skye, she states that she already had it done, she doesn't know who to call to cancel the appt.  Can you please call and cancel appt? sched for 02/27/2019

## 2019-01-24 NOTE — Telephone Encounter (Signed)
Where did she have it done and when? Only one I see when she had done was in May, per JN notes on recent visit Due to swelling and induratin of the medial leg both sides extending to the inner thigh I have ordered a doppler Vein study to rule out blood clot or DVT.

## 2019-01-24 NOTE — Telephone Encounter (Signed)
I have tried to call her but no answer, I will try again later.

## 2019-01-25 NOTE — Telephone Encounter (Signed)
lmom to call me back

## 2019-01-26 NOTE — Telephone Encounter (Signed)
I have tried to call a couple of times and can't get her to answer, I will keep trying

## 2019-01-27 ENCOUNTER — Telehealth: Payer: Self-pay | Admitting: Specialist

## 2019-01-27 NOTE — Telephone Encounter (Signed)
Patient left a voicemail message requesting the telephone number of the doctor he got for her.  CB#[phone].  Thank you.

## 2019-01-27 NOTE — Telephone Encounter (Signed)
They were done at [facility] on [address], she is going to have them send [place] a copy of it.

## 2019-01-27 NOTE — Telephone Encounter (Signed)
I called and gave her the number to [facility]

## 2019-02-15 ENCOUNTER — Ambulatory Visit (INDEPENDENT_AMBULATORY_CARE_PROVIDER_SITE_OTHER): Payer: Medicare Other | Admitting: Specialist

## 2019-02-15 ENCOUNTER — Other Ambulatory Visit: Payer: Self-pay

## 2019-02-15 ENCOUNTER — Encounter: Payer: Self-pay | Admitting: Specialist

## 2019-02-15 ENCOUNTER — Ambulatory Visit (INDEPENDENT_AMBULATORY_CARE_PROVIDER_SITE_OTHER): Payer: Medicare Other

## 2019-02-15 VITALS — BP 127/75 | Ht 61.0 in | Wt 134.0 lb

## 2019-02-15 DIAGNOSIS — M25551 Pain in right hip: Secondary | ICD-10-CM

## 2019-02-15 DIAGNOSIS — W19XXXA Unspecified fall, initial encounter: Secondary | ICD-10-CM

## 2019-02-15 DIAGNOSIS — M25559 Pain in unspecified hip: Secondary | ICD-10-CM

## 2019-02-15 DIAGNOSIS — S32591A Other specified fracture of right pubis, initial encounter for closed fracture: Secondary | ICD-10-CM | POA: Diagnosis not present

## 2019-02-15 DIAGNOSIS — Z9189 Other specified personal risk factors, not elsewhere classified: Secondary | ICD-10-CM

## 2019-02-15 DIAGNOSIS — I6523 Occlusion and stenosis of bilateral carotid arteries: Secondary | ICD-10-CM | POA: Diagnosis not present

## 2019-02-15 MED ORDER — ACETAMINOPHEN-CODEINE #3 300-30 MG PO TABS: 1.0000 | ORAL_TABLET | ORAL | 0 refills | Status: DC | PRN

## 2019-02-15 NOTE — Progress Notes (Signed)
Office Visit Note   Patient: Amber Yang           Date of Birth: Jul 31, 1930           MRN: [id number] Visit Date: 02/15/2019              Requested by: Henderson Baltimore, FNP [place],  [place] [address] PCP: Henderson Baltimore, FNP   Assessment & Plan: Visit Diagnoses:  1. Hip pain   2. Inferior pubic ramus fracture, right, closed, initial encounter ([place])   3. At risk for injury associated with anticoagulation   4. Fall, initial encounter     Plan: Avoid frequent bending and stooping  No lifting greater than 10 lbs. May use ice or moist heat for pain. Weight loss is of benefit. Do not take arthritis medications like motrin, celebrex and naprosyn due to the use of eliquis. Exercise is important to improve your indurance and does allow people to function better inspite of back pain. Use a wheel chair to ambulate over 10', 50% weight bearing right leg. MRI of the right hip to assess area of lucency over the medial femoral neck seen on AP and lateral radiographs today.   Follow-Up Instructions: No follow-ups on file.   Orders:  Orders Placed This Encounter  Procedures  . XR HIP UNILAT W OR W/O PELVIS 2-3 VIEWS RIGHT   No orders of the defined types were placed in this encounter.     Procedures: No procedures performed   Clinical Data: No additional findings.   Subjective: Chief Complaint  Patient presents with  . Right Hip - Pain, Injury    Had a fall on 02/11/2019    .  85 year old female with past history of L4-5 fusion for severe spinal stenosis with spondylolisthesis. She has been having problems with falling spells the most recent 10/17/202, about 4 days ago. She has had a cough and COPD. Saw her cardiologist about her legs to rule out blood clots in the legs. She has venous stasis in both legs and pitting edema to the upper legs. She is on lasix now for last 45 days. Fell in her wash room, with a cup of tea in both hands and was  carrying her hand book and fell landing on the right hip. She had pain with weight bearing on the right hip and leg no bowel or bladder changes. The bowel is slow and has alternating loose stools and constipation. The baclofen helps.    Review of Systems  Constitutional: Negative.   HENT: Negative.   Eyes: Negative.   Respiratory: Negative.   Cardiovascular: Negative.   Gastrointestinal: Negative.   Endocrine: Negative.   Genitourinary: Negative.   Musculoskeletal: Positive for back pain, gait problem, joint swelling and myalgias.  Skin: Negative.   Allergic/Immunologic: Negative for environmental allergies, food allergies and immunocompromised state.  Neurological: Negative for dizziness, tremors, seizures, syncope, facial asymmetry, speech difficulty, weakness, light-headedness, numbness and headaches.  Hematological: Negative for adenopathy. Bruises/bleeds easily.  Psychiatric/Behavioral: Negative for agitation, behavioral problems, confusion, decreased concentration, dysphoric mood, hallucinations, self-injury, sleep disturbance and suicidal ideas. The patient is not nervous/anxious and is not hyperactive.      Objective: Vital Signs: BP 127/75 (BP Location: Left Arm, Patient Position: Sitting)   Ht 5\' 1"  (1.549 m)   Wt 134 lb (60.8 kg)   BMI 25.32 kg/m   Physical Exam  Right Hip Exam   Tenderness  The patient is experiencing  tenderness in the anterior, lateral and greater trochanter.  Range of Motion  Abduction: abnormal  Adduction: abnormal  Extension: abnormal  Flexion: abnormal  External rotation: abnormal   Muscle Strength  Abduction: 4/5  Adduction: 4/5  Flexion: 4/5   Tests  FABER: negative Ober: negative  Other  Erythema: absent Scars: absent Sensation: normal Pulse: present  Comments:  Tender right lateral trochanter and right anterior groin.      Specialty Comments:  No specialty comments available.  Imaging: No results found.   PMFS  History: Patient Active Problem List   Diagnosis Date Noted  . Postoperative anemia due to acute blood loss 11/23/2013    Priority: Low    Class: Acute  . Shoulder tendinitis 10/05/2014  . Spinal stenosis, lumbar region, with neurogenic claudication 11/21/2013   Past Medical History:  Diagnosis Date  . Anxiety   . Asthma   . Constipation   . COPD (chronic obstructive pulmonary disease) (HCC)   . COPD, severe (HCC)   . Coronary artery disease   . DDD (degenerative disc disease)   . GERD (gastroesophageal reflux disease)   . H/O echocardiogram 01/27/2013  . Hearing loss   . History of colon polyps   . History of nuclear stress test 12/20/2012  . Hyperlipemia, mixed   . Hypertension   . Hypothyroidism   . Mitral valve regurgitation   . Nocturnal leg cramps   . Peripheral vascular disease (HCC)    followed by Dr Sondra Come  . Rheumatoid arthritis (HCC)   . Shortness of breath    exertional  . UTI (urinary tract infection) 05/2013    History reviewed. No pertinent family history.  Past Surgical History:  Procedure Laterality Date  . ABDOMINAL HYSTERECTOMY    . CAROTID ENDARTERECTOMY Right 05/2013  . CATARACT EXTRACTION, BILATERAL    . CERVICAL FUSION  2012  . COLONOSCOPY W/ POLYPECTOMY  2008  . EYE SURGERY    . FOOT SURGERY Right   . VASCULAR SURGERY     Social History   Occupational History  . Not on file  Tobacco Use  . Smoking status: Former Smoker    Packs/day: 1.00    Years: 40.00    Pack years: 40.00    Types: Cigarettes    Quit date: 11/15/2003    Years since quitting: 15.2  . Smokeless tobacco: Never Used  Substance and Sexual Activity  . Alcohol use: No  . Drug use: No  . Sexual activity: Never

## 2019-02-15 NOTE — Patient Instructions (Addendum)
Plan: Avoid frequent bending and stooping  No lifting greater than 10 lbs. May use ice or moist heat for pain. Weight loss is of benefit. Do not take arthritis medications like motrin, celebrex and naprosyn due to the use of eliquis. Exercise is important to improve your indurance and does allow people to function better inspite of back pain. Use a wheel chair to ambulate over 10', 50% weight bearing right leg. MRI of the right hip to assess area of lucency over the medial femoral neck seen on AP and lateral radiographs today.

## 2019-02-18 ENCOUNTER — Other Ambulatory Visit: Payer: Self-pay

## 2019-02-18 ENCOUNTER — Ambulatory Visit (HOSPITAL_BASED_OUTPATIENT_CLINIC_OR_DEPARTMENT_OTHER)
Admission: RE | Admit: 2019-02-18 | Discharge: 2019-02-18 | Disposition: A | Discharge status: Home / Self Care | Payer: Medicare Other | Source: Ambulatory Visit | Attending: Specialist | Admitting: Specialist

## 2019-02-18 DIAGNOSIS — Z9189 Other specified personal risk factors, not elsewhere classified: Secondary | ICD-10-CM | POA: Diagnosis not present

## 2019-02-18 DIAGNOSIS — W19XXXA Unspecified fall, initial encounter: Secondary | ICD-10-CM | POA: Insufficient documentation

## 2019-02-18 DIAGNOSIS — M25551 Pain in right hip: Secondary | ICD-10-CM | POA: Insufficient documentation

## 2019-02-18 DIAGNOSIS — R609 Edema, unspecified: Secondary | ICD-10-CM | POA: Insufficient documentation

## 2019-02-18 DIAGNOSIS — M7989 Other specified soft tissue disorders: Secondary | ICD-10-CM | POA: Diagnosis not present

## 2019-02-18 DIAGNOSIS — S32591A Other specified fracture of right pubis, initial encounter for closed fracture: Secondary | ICD-10-CM | POA: Insufficient documentation

## 2019-02-18 DIAGNOSIS — M25559 Pain in unspecified hip: Secondary | ICD-10-CM

## 2019-03-01 ENCOUNTER — Ambulatory Visit (INDEPENDENT_AMBULATORY_CARE_PROVIDER_SITE_OTHER): Payer: Medicare Other | Admitting: Specialist

## 2019-03-01 ENCOUNTER — Encounter: Payer: Self-pay | Admitting: Specialist

## 2019-03-01 ENCOUNTER — Other Ambulatory Visit: Payer: Self-pay

## 2019-03-01 VITALS — BP 87/48 | HR 129 | Ht 61.0 in | Wt 134.0 lb

## 2019-03-01 DIAGNOSIS — R000 Tachycardia, unspecified: Secondary | ICD-10-CM

## 2019-03-01 DIAGNOSIS — I950 Idiopathic hypotension: Secondary | ICD-10-CM

## 2019-03-01 DIAGNOSIS — S32591D Other specified fracture of right pubis, subsequent encounter for fracture with routine healing: Secondary | ICD-10-CM

## 2019-03-01 DIAGNOSIS — R600 Localized edema: Secondary | ICD-10-CM | POA: Diagnosis not present

## 2019-03-01 DIAGNOSIS — I6523 Occlusion and stenosis of bilateral carotid arteries: Secondary | ICD-10-CM | POA: Diagnosis not present

## 2019-03-01 NOTE — Patient Instructions (Signed)
Plan: Fall Prevention and Home Safety Falls cause injuries and can affect all age groups. It is possible to use preventive measures to significantly decrease the likelihood of falls. There are many simple measures which can make your home safer and prevent falls. OUTDOORS  Repair cracks and edges of walkways and driveways.  Remove high doorway thresholds.  Trim shrubbery on the main path into your home.  Have good outside lighting.  Clear walkways of tools, rocks, debris, and clutter.  Check that handrails are not broken and are securely fastened. Both sides of steps should have handrails.  Have leaves, snow, and ice cleared regularly.  Use sand or salt on walkways during winter months.  In the garage, clean up grease or oil spills. BATHROOM  Install night lights.  Install grab bars by the toilet and in the tub and shower.  Use non-skid mats or decals in the tub or shower.  Place a plastic non-slip stool in the shower to sit on, if needed.  Keep floors dry and clean up all water on the floor immediately.  Remove soap buildup in the tub or shower on a regular basis.  Secure bath mats with non-slip, double-sided rug tape.  Remove throw rugs and tripping hazards from the floors. BEDROOMS  Install night lights.  Make sure a bedside light is easy to reach.  Do not use oversized bedding.  Keep a telephone by your bedside.  Have a firm chair with side arms to use for getting dressed.  Remove throw rugs and tripping hazards from the floor. KITCHEN  Keep handles on pots and pans turned toward the center of the stove. Use back burners when possible.  Clean up spills quickly and allow time for drying.  Avoid walking on wet floors.  Avoid hot utensils and knives.  Position shelves so they are not too high or low.  Place commonly used objects within easy reach.  If necessary, use a sturdy step stool with a grab bar when reaching.  Keep electrical cables out of  the way.  Do not use floor polish or wax that makes floors slippery. If you must use wax, use non-skid floor wax.  Remove throw rugs and tripping hazards from the floor. STAIRWAYS  Never leave objects on stairs.  Place handrails on both sides of stairways and use them. Fix any loose handrails. Make sure handrails on both sides of the stairways are as long as the stairs.  Check carpeting to make sure it is firmly attached along stairs. Make repairs to worn or loose carpet promptly.  Avoid placing throw rugs at the top or bottom of stairways, or properly secure the rug with carpet tape to prevent slippage. Get rid of throw rugs, if possible.  Have an electrician put in a light switch at the top and bottom of the stairs. OTHER FALL PREVENTION TIPS  Wear low-heel or rubber-soled shoes that are supportive and fit well. Wear closed toe shoes.  When using a stepladder, make sure it is fully opened and both spreaders are firmly locked. Do not climb a closed stepladder.  Add color or contrast paint or tape to grab bars and handrails in your home. Place contrasting color strips on first and last steps.  Learn and use mobility aids as needed. Install an electrical emergency response system.  Turn on lights to avoid dark areas. Replace light bulbs that burn out immediately. Get light switches that glow.  Arrange furniture to create clear pathways. Keep furniture in the same  place.  Firmly attach carpet with non-skid or double-sided tape.  Eliminate uneven floor surfaces.  Select a carpet pattern that does not visually hide the edge of steps.  Be aware of all pets. OTHER HOME SAFETY TIPS  Set the water temperature for 120 F (48.8 C).  Keep emergency numbers on or near the telephone.  Keep smoke detectors on every level of the home and near sleeping areas. Document Released: 04/03/2002 Document Revised: 10/13/2011 Document Reviewed: 07/03/2011 [facility] Patient Information 2014  [place].  May weight bear as tolerated on both legs, use a walker for ambulation. Your blood pressure and heart rate suggest that you need an evaluation by your cardiologist  To improve overall cardiac function. Please see Dr. Richardson Chiquito for an appointment 2 PM, Tuesday November 10,2020Follow-Up Instructions.    No follow-ups on file.

## 2019-03-01 NOTE — Progress Notes (Signed)
Office Visit Note   Patient: Amber Yang           Date of Birth: 1930/11/30           MRN: [id number] Visit Date: 03/01/2019              Requested by: Amelia Jo, FNP [address] [place],  [place] [address] PCP: Amelia Jo, FNP   Assessment & Plan: Visit Diagnoses:  1. Bilateral lower extremity edema   2. Pubic ramus fracture, right, with routine healing, subsequent encounter   3. Idiopathic hypotension   4. Tachycardia   85 year old female with recent 3 weeks ago syncopy episode with fall and right pubic ramus fracture. She has had pitting edema in both lower extremities and todays BP is decrease compared with last visit and she has tachycardia. Her BP is 87/48  And hear rate 129. I called her cardiologist and requested an evaluation. I spoke with Marchelle Folks and she was very helpful and an appt for follow up is give for next Tuesday in 6 days. I will see her back in 4 weeks.  Plan: Fall Prevention and Home Safety Falls cause injuries and can affect all age groups. It is possible to use preventive measures to significantly decrease the likelihood of falls. There are many simple measures which can make your home safer and prevent falls. OUTDOORS  Repair cracks and edges of walkways and driveways.  Remove high doorway thresholds.  Trim shrubbery on the main path into your home.  Have good outside lighting.  Clear walkways of tools, rocks, debris, and clutter.  Check that handrails are not broken and are securely fastened. Both sides of steps should have handrails.  Have leaves, snow, and ice cleared regularly.  Use sand or salt on walkways during winter months.  In the garage, clean up grease or oil spills. BATHROOM  Install night lights.  Install grab bars by the toilet and in the tub and shower.  Use non-skid mats or decals in the tub or shower.  Place a plastic non-slip stool in the shower to sit on, if needed.  Keep floors dry and clean up  all water on the floor immediately.  Remove soap buildup in the tub or shower on a regular basis.  Secure bath mats with non-slip, double-sided rug tape.  Remove throw rugs and tripping hazards from the floors. BEDROOMS  Install night lights.  Make sure a bedside light is easy to reach.  Do not use oversized bedding.  Keep a telephone by your bedside.  Have a firm chair with side arms to use for getting dressed.  Remove throw rugs and tripping hazards from the floor. KITCHEN  Keep handles on pots and pans turned toward the center of the stove. Use back burners when possible.  Clean up spills quickly and allow time for drying.  Avoid walking on wet floors.  Avoid hot utensils and knives.  Position shelves so they are not too high or low.  Place commonly used objects within easy reach.  If necessary, use a sturdy step stool with a grab bar when reaching.  Keep electrical cables out of the way.  Do not use floor polish or wax that makes floors slippery. If you must use wax, use non-skid floor wax.  Remove throw rugs and tripping hazards from the floor. STAIRWAYS  Never leave objects on stairs.  Place handrails on both sides of stairways and use them. Fix any loose  handrails. Make sure handrails on both sides of the stairways are as long as the stairs.  Check carpeting to make sure it is firmly attached along stairs. Make repairs to worn or loose carpet promptly.  Avoid placing throw rugs at the top or bottom of stairways, or properly secure the rug with carpet tape to prevent slippage. Get rid of throw rugs, if possible.  Have an electrician put in a light switch at the top and bottom of the stairs. OTHER FALL PREVENTION TIPS  Wear low-heel or rubber-soled shoes that are supportive and fit well. Wear closed toe shoes.  When using a stepladder, make sure it is fully opened and both spreaders are firmly locked. Do not climb a closed stepladder.  Add color or  contrast paint or tape to grab bars and handrails in your home. Place contrasting color strips on first and last steps.  Learn and use mobility aids as needed. Install an electrical emergency response system.  Turn on lights to avoid dark areas. Replace light bulbs that burn out immediately. Get light switches that glow.  Arrange furniture to create clear pathways. Keep furniture in the same place.  Firmly attach carpet with non-skid or double-sided tape.  Eliminate uneven floor surfaces.  Select a carpet pattern that does not visually hide the edge of steps.  Be aware of all pets. OTHER HOME SAFETY TIPS  Set the water temperature for 120 F (48.8 C).  Keep emergency numbers on or near the telephone.  Keep smoke detectors on every level of the home and near sleeping areas. Document Released: 04/03/2002 Document Revised: 10/13/2011 Document Reviewed: 07/03/2011 [facility] Patient Information 2014 [place], [place].  May weight bear as tolerated on both legs, use a walker for ambulation. Your blood pressure and heart rate suggest that you need an evaluation by your cardiologist  To improve overall cardiac function. Please see Dr. Asa Saunas for an appointment 2 PM, Tuesday November 10,2020Follow-Up Instructions.    No follow-ups on file.   Orders:  No orders of the defined types were placed in this encounter.  No orders of the defined types were placed in this encounter.     Procedures: No procedures performed   Clinical Data: Findings:  CLINICAL DATA:  Status post trauma 4 days ago following a fall.  EXAM: MR OF THE RIGHT HIP WITHOUT CONTRAST  TECHNIQUE: Multiplanar, multisequence MR imaging was performed. No intravenous contrast was administered.  COMPARISON:  None.  FINDINGS: Bones: No hip fracture, dislocation or avascular necrosis. Acute nondisplaced fractures of the right superior and inferior pubic rami extending into the pubic body. No periosteal  reaction or bone destruction. No aggressive osseous lesion.  Normal sacrum and sacroiliac joints. No SI joint widening or erosive changes. Degenerative disease with disc height loss throughout the thoracolumbar spine. Posterior lumbar fusion at L4-5.  Articular cartilage and labrum  Articular cartilage:  No chondral defect.  Labrum: Grossly intact, but evaluation is limited by lack of intraarticular fluid.  Joint or bursal effusion  Joint effusion: Small bilateral hip joint effusion. No SI joint effusion.  Bursae:  No bursa formation.  Muscles and tendons  Flexors: Normal.  Extensors: Normal.  Abductors: Normal.  Adductors: Soft tissue edema and intermediate signal material within the obturator externus consistent with a intramuscular hematoma and a partial tear.  Gluteals: Normal.  Hamstrings: Small interstitial tear of the right hamstring origin.  Other findings  Miscellaneous: No pelvic free fluid. No fluid collection or hematoma. No inguinal lymphadenopathy. No inguinal hernia.  Severe soft tissue edema in the subcutaneous fat around the hip. 2.8 x 3.5 x 6.7 cm complex fluid collection in the subcutaneous fat likely reflecting a hematoma.  Diverticulosis without evidence of diverticulitis.  IMPRESSION: 1. Nondisplaced fractures of the right superior and inferior pubic rami extending into the pubic body. Soft tissue edema and intermediate signal material within the obturator externus consistent with a intramuscular hematoma and a partial tear. Hematoma in the subcutaneous fat overlying the right greater trochanter measuring 2.8 x 3.5 x 6.7 cm.  2.  No acute hip fracture or dislocation.  3.  Diffuse lumbar spine spondylosis partially visualized.   Electronically Signed   By: Kathreen Devoid   On: 02/18/2019 17:29     Subjective: Chief Complaint  Patient presents with  . Right Hip - Follow-up    MRI Review    85 year old  female with history of fall about 3 weeks ago with right anterior groin pain and discomfort with standing and weight bearing on the right leg. No bowel or bladder difficulty. No worsening of pain. Results of MRI show no right hip Fracture, there are findings consistent with a right pubic ramus fracture and adductor and proximal hamstring strain.    Review of Systems  Constitutional: Positive for unexpected weight change. Negative for activity change, chills, diaphoresis, fatigue and fever.  HENT: Positive for hearing loss. Negative for congestion, dental problem, drooling, ear pain, facial swelling, mouth sores, nosebleeds, postnasal drip, rhinorrhea, sinus pressure, sinus pain, sneezing, sore throat, tinnitus, trouble swallowing and voice change.   Eyes: Negative.  Negative for photophobia, pain, discharge, redness, itching and visual disturbance.  Respiratory: Negative.  Negative for apnea, cough, choking, chest tightness, shortness of breath, wheezing and stridor.   Cardiovascular: Positive for leg swelling. Negative for chest pain and palpitations.  Gastrointestinal: Negative.  Negative for abdominal distention, abdominal pain, anal bleeding, blood in stool, constipation, diarrhea, nausea, rectal pain and vomiting.  Endocrine: Negative.  Negative for cold intolerance, heat intolerance, polydipsia, polyphagia and polyuria.  Genitourinary: Negative.  Negative for difficulty urinating, dyspareunia, dysuria, enuresis, flank pain, frequency, genital sores, hematuria, menstrual problem, pelvic pain and urgency.  Musculoskeletal: Negative.  Negative for arthralgias, back pain, gait problem, joint swelling, myalgias, neck pain and neck stiffness.  Skin: Negative.  Negative for color change, pallor, rash and wound.  Allergic/Immunologic: Negative.  Negative for environmental allergies, food allergies and immunocompromised state.  Neurological: Negative.  Negative for dizziness, tremors, seizures,  syncope, facial asymmetry, speech difficulty, weakness, light-headedness, numbness and headaches.  Hematological: Negative.   Psychiatric/Behavioral: Negative.  Negative for agitation, behavioral problems, confusion, decreased concentration, dysphoric mood, hallucinations, self-injury, sleep disturbance and suicidal ideas. The patient is not nervous/anxious and is not hyperactive.      Objective: Vital Signs: BP (!) 87/48 (BP Location: Left Arm, Patient Position: Sitting)   Pulse (!) 129   Ht 5\' 1"  (1.549 m)   Wt 134 lb (60.8 kg)   BMI 25.32 kg/m   Physical Exam  Right Hip Exam   Tenderness  The patient is experiencing tenderness in the anterior.  Range of Motion  Abduction: abnormal  Adduction: abnormal   Muscle Strength  Abduction: 4/5  Adduction: 4/5  Flexion: 4/5   Tests  FABER: negative Ober: negative  Other  Erythema: absent Scars: absent Sensation: normal Pulse: present  Comments:  Pitting edema in both thighs consistent with anasarca.    Back Exam   Tenderness  The patient is experiencing tenderness in the lumbar.  Range of Motion  Extension: normal  Flexion: normal  Lateral bend right: normal  Lateral bend left: normal  Rotation right: normal  Rotation left: normal   Muscle Strength  Right Quadriceps:  5/5  Left Quadriceps:  5/5  Right Hamstrings:  5/5  Left Hamstrings:  5/5   Tests  Straight leg raise right: negative Straight leg raise left: negative  Reflexes  Patellar: normal Achilles: normal  Other  Toe walk: normal Heel walk: normal Erythema: no back redness Scars: absent      Specialty Comments:  No specialty comments available.  Imaging: No results found.   PMFS History: Patient Active Problem List   Diagnosis Date Noted  . Postoperative anemia due to acute blood loss 11/23/2013    Priority: Low    Class: Acute  . Shoulder tendinitis 10/05/2014  . Spinal stenosis, lumbar region, with neurogenic claudication  11/21/2013   Past Medical History:  Diagnosis Date  . Anxiety   . Asthma   . Constipation   . COPD (chronic obstructive pulmonary disease) (HCC)   . COPD, severe (HCC)   . Coronary artery disease   . DDD (degenerative disc disease)   . GERD (gastroesophageal reflux disease)   . H/O echocardiogram 01/27/2013  . Hearing loss   . History of colon polyps   . History of nuclear stress test 12/20/2012  . Hyperlipemia, mixed   . Hypertension   . Hypothyroidism   . Mitral valve regurgitation   . Nocturnal leg cramps   . Peripheral vascular disease (HCC)    followed by Dr Sondra Comeruz  . Rheumatoid arthritis (HCC)   . Shortness of breath    exertional  . UTI (urinary tract infection) 05/2013    History reviewed. No pertinent family history.  Past Surgical History:  Procedure Laterality Date  . ABDOMINAL HYSTERECTOMY    . CAROTID ENDARTERECTOMY Right 05/2013  . CATARACT EXTRACTION, BILATERAL    . CERVICAL FUSION  2012  . COLONOSCOPY W/ POLYPECTOMY  2008  . EYE SURGERY    . FOOT SURGERY Right   . VASCULAR SURGERY     Social History   Occupational History  . Not on file  Tobacco Use  . Smoking status: Former Smoker    Packs/day: 1.00    Years: 40.00    Pack years: 40.00    Types: Cigarettes    Quit date: 11/15/2003    Years since quitting: 15.3  . Smokeless tobacco: Never Used  Substance and Sexual Activity  . Alcohol use: No  . Drug use: No  . Sexual activity: Never

## 2019-03-09 ENCOUNTER — Other Ambulatory Visit: Payer: Self-pay | Admitting: Specialist

## 2019-04-05 ENCOUNTER — Other Ambulatory Visit: Payer: Self-pay | Admitting: Specialist

## 2019-04-10 ENCOUNTER — Encounter: Payer: Self-pay | Admitting: Specialist

## 2019-04-10 ENCOUNTER — Ambulatory Visit (INDEPENDENT_AMBULATORY_CARE_PROVIDER_SITE_OTHER): Payer: Medicare Other | Admitting: Specialist

## 2019-04-10 ENCOUNTER — Other Ambulatory Visit: Payer: Self-pay

## 2019-04-10 VITALS — BP 126/87 | HR 92 | Ht 61.0 in | Wt 134.0 lb

## 2019-04-10 DIAGNOSIS — M170 Bilateral primary osteoarthritis of knee: Secondary | ICD-10-CM | POA: Diagnosis not present

## 2019-04-10 DIAGNOSIS — G609 Hereditary and idiopathic neuropathy, unspecified: Secondary | ICD-10-CM

## 2019-04-10 DIAGNOSIS — M4807 Spinal stenosis, lumbosacral region: Secondary | ICD-10-CM

## 2019-04-10 DIAGNOSIS — I6523 Occlusion and stenosis of bilateral carotid arteries: Secondary | ICD-10-CM | POA: Diagnosis not present

## 2019-04-10 NOTE — Progress Notes (Signed)
Office Visit Note   Patient: Amber Yang           Date of Birth: Apr 12, 1931           MRN: [id number] Visit Date: 04/10/2019              Requested by: Henderson Baltimore, FNP [address] [place],  [place] [address] PCP: Henderson Baltimore, FNP   Assessment & Plan: Visit Diagnoses:  1. Bilateral primary osteoarthritis of knee   2. Spinal stenosis of lumbosacral region   3. Peripheral neuropathy, idiopathic     Plan: The main ways of treat osteoarthritis, that are found to be success. Weight loss helps to decrease pain. Exercise is important to maintaining cartilage and thickness and strengthening. NSAID gel voltaren gel is able to decrease the inflamation. Ice is okay  In afternoon and evening and hot shower in the am Further steroid injections may be helpful but right now the pain appears to be stable and not require injection today. Continue with diuresis and follow up with cardiology. Call is the knee pain is severe and you wish to consider further steroid injection then.   Follow-Up Instructions: Return in about 3 months (around 07/09/2019).   Orders:  No orders of the defined types were placed in this encounter.  No orders of the defined types were placed in this encounter.     Procedures: No procedures performed   Clinical Data: No additional findings.   Subjective: Chief Complaint  Patient presents with  . Right Leg - Follow-up  . Left Leg - Follow-up    85 year old female with history of lumbar fusion for spondylolisthesis with spinal stenosis. She is feeling better with use of lasix and is using an occasional voltaren gel for pain in the knees and hands. It seems to help and there is frequency and urgency.    Review of Systems  All other systems reviewed and are negative.    Objective: Vital Signs: BP 126/87 (BP Location: Left Arm, Patient Position: Sitting)   Pulse 92   Ht 5\' 1"  (1.549 m)   Wt 134 lb (60.8 kg)   BMI 25.32 kg/m    Physical Exam Musculoskeletal:     Lumbar back: Negative right straight leg raise test and negative left straight leg raise test.     Back Exam   Tenderness  The patient is experiencing tenderness in the lumbar.  Range of Motion  Extension: abnormal  Flexion: abnormal  Lateral bend right: abnormal  Lateral bend left: abnormal  Rotation right: abnormal  Rotation left: abnormal   Muscle Strength  The patient has normal back strength. Right Quadriceps:  5/5  Left Quadriceps:  5/5  Right Hamstrings:  5/5  Left Hamstrings:  5/5   Tests  Straight leg raise right: negative Straight leg raise left: negative  Reflexes  Patellar: 0/4 Achilles: 0/4 Biceps: abnormal Babinski's sign: normal   Other  Toe walk: normal Heel walk: normal Sensation: decreased Gait: normal  Erythema: no back redness Scars: absent      Specialty Comments:  No specialty comments available.  Imaging: No results found.   PMFS History: Patient Active Problem List   Diagnosis Date Noted  . Postoperative anemia due to acute blood loss 11/23/2013    Priority: Low    Class: Acute  . Shoulder tendinitis 10/05/2014  . Spinal stenosis, lumbar region, with neurogenic claudication 11/21/2013   Past Medical History:  Diagnosis Date  . Anxiety   .  Asthma   . Constipation   . COPD (chronic obstructive pulmonary disease) (HCC)   . COPD, severe (HCC)   . Coronary artery disease   . DDD (degenerative disc disease)   . GERD (gastroesophageal reflux disease)   . H/O echocardiogram 01/27/2013  . Hearing loss   . History of colon polyps   . History of nuclear stress test 12/20/2012  . Hyperlipemia, mixed   . Hypertension   . Hypothyroidism   . Mitral valve regurgitation   . Nocturnal leg cramps   . Peripheral vascular disease (HCC)    followed by Dr Sondra Come  . Rheumatoid arthritis (HCC)   . Shortness of breath    exertional  . UTI (urinary tract infection) 05/2013    History reviewed. No  pertinent family history.  Past Surgical History:  Procedure Laterality Date  . ABDOMINAL HYSTERECTOMY    . CAROTID ENDARTERECTOMY Right 05/2013  . CATARACT EXTRACTION, BILATERAL    . CERVICAL FUSION  2012  . COLONOSCOPY W/ POLYPECTOMY  2008  . EYE SURGERY    . FOOT SURGERY Right   . VASCULAR SURGERY     Social History   Occupational History  . Not on file  Tobacco Use  . Smoking status: Former Smoker    Packs/day: 1.00    Years: 40.00    Pack years: 40.00    Types: Cigarettes    Quit date: 11/15/2003    Years since quitting: 15.4  . Smokeless tobacco: Never Used  Substance and Sexual Activity  . Alcohol use: No  . Drug use: No  . Sexual activity: Never

## 2019-04-10 NOTE — Patient Instructions (Addendum)
The main ways of treat osteoarthritis, that are found to be success. Weight loss helps to decrease pain. Exercise is important to maintaining cartilage and thickness and strengthening. NSAID gel voltaren gel is able to decrease the inflamation. Ice is okay  In afternoon and evening and hot shower in the am Further steroid injections may be helpful but right now the pain appears to be stable and not require injection today. Continue with diuresis and follow up with cardiology. Call is the knee pain is severe and you wish to consider further steroid injection then.

## 2019-04-24 ENCOUNTER — Other Ambulatory Visit: Payer: Self-pay | Admitting: Specialist

## 2019-05-13 ENCOUNTER — Other Ambulatory Visit: Payer: Self-pay | Admitting: Specialist

## 2019-06-02 ENCOUNTER — Other Ambulatory Visit: Payer: Self-pay | Admitting: Specialist

## 2019-06-23 ENCOUNTER — Other Ambulatory Visit: Payer: Self-pay | Admitting: Specialist

## 2019-07-10 ENCOUNTER — Encounter: Payer: Self-pay | Admitting: Specialist

## 2019-07-10 ENCOUNTER — Other Ambulatory Visit: Payer: Self-pay

## 2019-07-10 ENCOUNTER — Ambulatory Visit (INDEPENDENT_AMBULATORY_CARE_PROVIDER_SITE_OTHER): Payer: Medicare Other | Admitting: Specialist

## 2019-07-10 VITALS — BP 120/85 | HR 81 | Ht 60.0 in | Wt 116.8 lb

## 2019-07-10 DIAGNOSIS — M170 Bilateral primary osteoarthritis of knee: Secondary | ICD-10-CM

## 2019-07-10 DIAGNOSIS — G609 Hereditary and idiopathic neuropathy, unspecified: Secondary | ICD-10-CM | POA: Diagnosis not present

## 2019-07-10 DIAGNOSIS — M4807 Spinal stenosis, lumbosacral region: Secondary | ICD-10-CM | POA: Diagnosis not present

## 2019-07-10 DIAGNOSIS — W19XXXA Unspecified fall, initial encounter: Secondary | ICD-10-CM | POA: Diagnosis not present

## 2019-07-10 NOTE — Patient Instructions (Signed)
Avoid bending, stooping and avoid lifting weights greater than 10 lbs. Avoid prolong standing and walking. Avoid frequent bending and stooping  No lifting greater than 10 lbs. May use ice or moist heat for pain. Weight loss is of benefit. Handicap license is approved. The main ways of treat osteoarthritis, that are found to be success. Weight loss helps to decrease pain. Exercise is important to maintaining cartilage and thickness and strengthening. Do not take NSAIDs like motrin or alleve are meds decreasing the inflamation as these should not be taken with eloquis due to  Risk of bleeding.  Ice is okay  In afternoon and evening and hot shower in the am

## 2019-07-10 NOTE — Progress Notes (Signed)
Office Visit Note   Patient: Amber Yang           Date of Birth: 1930/08/11           MRN: [id number] Visit Date: 07/10/2019              Requested by: Henderson Baltimore, FNP [place],  [place] [address] PCP: Henderson Baltimore, FNP   Assessment & Plan: Visit Diagnoses:  1. Spinal stenosis of lumbosacral region   2. Bilateral primary osteoarthritis of knee   3. Fall, initial encounter   4. Peripheral neuropathy, idiopathic     Plan:Avoid bending, stooping and avoid lifting weights greater than 10 lbs. Avoid prolong standing and walking. Avoid frequent bending and stooping  No lifting greater than 10 lbs. May use ice or moist heat for pain. Weight loss is of benefit. Handicap license is approved. The main ways of treat osteoarthritis, that are found to be success. Weight loss helps to decrease pain. Exercise is important to maintaining cartilage and thickness and strengthening. Do not take NSAIDs like motrin or alleve are meds decreasing the inflamation as these should not be taken with eloquis due to  Risk of bleeding.  Ice is okay  In afternoon and evening and hot shower in the am  Follow-Up Instructions: Return in about 3 months (around 10/10/2019).   Orders:  No orders of the defined types were placed in this encounter.  No orders of the defined types were placed in this encounter.     Procedures: No procedures performed   Clinical Data: No additional findings.   Subjective: Chief Complaint  Patient presents with  . Right Knee - Follow-up  . Left Knee - Follow-up  . Lower Back - Follow-up    86 year old female with history of lumbar fusion and adjacent level lumbar spinal stenosis. Also with DJD of the knees. She is  Doing well and warmer weather has helped her back and her knees. She is continuing to drive intermittantly. No bowel or bladder difficulty. Not wanting injections   Review of Systems  Constitutional: Negative.     HENT: Negative.   Eyes: Negative.   Respiratory: Negative.   Cardiovascular: Negative.   Gastrointestinal: Negative.   Endocrine: Negative.   Genitourinary: Negative.   Musculoskeletal: Negative.   Skin: Negative.   Allergic/Immunologic: Negative.   Neurological: Negative.   Hematological: Negative.   Psychiatric/Behavioral: Negative.      Objective: Vital Signs: BP 120/85 (BP Location: Left Arm, Patient Position: Sitting)   Pulse 81   Ht 5' (1.524 m)   Wt 116 lb 12.8 oz (53 kg)   BMI 22.81 kg/m   Physical Exam Constitutional:      Appearance: She is well-developed.  HENT:     Head: Normocephalic and atraumatic.  Eyes:     Pupils: Pupils are equal, round, and reactive to light.  Pulmonary:     Effort: Pulmonary effort is normal.     Breath sounds: Normal breath sounds.  Abdominal:     General: Bowel sounds are normal.     Palpations: Abdomen is soft.  Musculoskeletal:     Cervical back: Normal range of motion and neck supple.     Lumbar back: Negative right straight leg raise test and negative left straight leg raise test.  Skin:    General: Skin is warm and dry.  Neurological:     Mental Status: She is alert and oriented to person, place, and  time.  Psychiatric:        Behavior: Behavior normal.        Thought Content: Thought content normal.        Judgment: Judgment normal.     Back Exam   Tenderness  The patient is experiencing tenderness in the lumbar.  Range of Motion  Extension: abnormal  Flexion: abnormal  Lateral bend right: abnormal  Lateral bend left: abnormal  Rotation right: abnormal  Rotation left: abnormal   Muscle Strength  Right Quadriceps:  5/5  Left Quadriceps:  5/5  Right Hamstrings:  5/5  Left Hamstrings:  5/5   Tests  Straight leg raise right: negative Straight leg raise left: negative  Reflexes  Patellar: 2/4 Achilles: 2/4 Babinski's sign: normal   Other  Toe walk: normal Heel walk: normal Sensation:  normal Gait: normal  Erythema: no back redness Scars: absent      Specialty Comments:  No specialty comments available.  Imaging: No results found.   PMFS History: Patient Active Problem List   Diagnosis Date Noted  . Postoperative anemia due to acute blood loss 11/23/2013    Priority: Low    Class: Acute  . Shoulder tendinitis 10/05/2014  . Spinal stenosis, lumbar region, with neurogenic claudication 11/21/2013   Past Medical History:  Diagnosis Date  . Anxiety   . Asthma   . Constipation   . COPD (chronic obstructive pulmonary disease) (HCC)   . COPD, severe (HCC)   . Coronary artery disease   . DDD (degenerative disc disease)   . GERD (gastroesophageal reflux disease)   . H/O echocardiogram 01/27/2013  . Hearing loss   . History of colon polyps   . History of nuclear stress test 12/20/2012  . Hyperlipemia, mixed   . Hypertension   . Hypothyroidism   . Mitral valve regurgitation   . Nocturnal leg cramps   . Peripheral vascular disease (HCC)    followed by Dr Sondra Come  . Rheumatoid arthritis (HCC)   . Shortness of breath    exertional  . UTI (urinary tract infection) 05/2013    History reviewed. No pertinent family history.  Past Surgical History:  Procedure Laterality Date  . ABDOMINAL HYSTERECTOMY    . CAROTID ENDARTERECTOMY Right 05/2013  . CATARACT EXTRACTION, BILATERAL    . CERVICAL FUSION  2012  . COLONOSCOPY W/ POLYPECTOMY  2008  . EYE SURGERY    . FOOT SURGERY Right   . VASCULAR SURGERY     Social History   Occupational History  . Not on file  Tobacco Use  . Smoking status: Former Smoker    Packs/day: 1.00    Years: 40.00    Pack years: 40.00    Types: Cigarettes    Quit date: 11/15/2003    Years since quitting: 15.6  . Smokeless tobacco: Never Used  Substance and Sexual Activity  . Alcohol use: No  . Drug use: No  . Sexual activity: Never

## 2019-07-11 ENCOUNTER — Other Ambulatory Visit: Payer: Self-pay | Admitting: Specialist

## 2019-07-31 ENCOUNTER — Other Ambulatory Visit: Payer: Self-pay | Admitting: Specialist

## 2019-08-19 ENCOUNTER — Other Ambulatory Visit: Payer: Self-pay | Admitting: Specialist

## 2019-09-11 ENCOUNTER — Other Ambulatory Visit: Payer: Self-pay | Admitting: Specialist

## 2019-10-02 ENCOUNTER — Other Ambulatory Visit: Payer: Self-pay | Admitting: Specialist

## 2019-10-12 ENCOUNTER — Other Ambulatory Visit: Payer: Self-pay

## 2019-10-12 ENCOUNTER — Encounter: Payer: Self-pay | Admitting: Specialist

## 2019-10-12 ENCOUNTER — Ambulatory Visit (INDEPENDENT_AMBULATORY_CARE_PROVIDER_SITE_OTHER): Payer: Medicare Other | Admitting: Specialist

## 2019-10-12 VITALS — BP 111/68 | HR 72 | Ht 60.0 in | Wt 112.2 lb

## 2019-10-12 DIAGNOSIS — M25512 Pain in left shoulder: Secondary | ICD-10-CM

## 2019-10-12 DIAGNOSIS — M25511 Pain in right shoulder: Secondary | ICD-10-CM | POA: Diagnosis not present

## 2019-10-12 DIAGNOSIS — M47812 Spondylosis without myelopathy or radiculopathy, cervical region: Secondary | ICD-10-CM | POA: Diagnosis not present

## 2019-10-12 DIAGNOSIS — G8929 Other chronic pain: Secondary | ICD-10-CM

## 2019-10-12 DIAGNOSIS — M1711 Unilateral primary osteoarthritis, right knee: Secondary | ICD-10-CM | POA: Diagnosis not present

## 2019-10-12 DIAGNOSIS — M1712 Unilateral primary osteoarthritis, left knee: Secondary | ICD-10-CM

## 2019-10-12 MED ORDER — ACETAMINOPHEN-CODEINE #3 300-30 MG PO TABS: ORAL_TABLET | ORAL | 0 refills | Status: DC

## 2019-10-12 NOTE — Progress Notes (Addendum)
Office Visit Note   Patient: Amber Yang           Date of Birth: 11-06-1930           MRN: [id number] Visit Date: 10/12/2019              Requested by: Amelia Jo, FNP [address] [place],  [place] [address] PCP: Amelia Jo, FNP   Assessment & Plan: Visit Diagnoses:  1. Spondylosis without myelopathy or radiculopathy, cervical region   2. Chronic pain of both shoulders   3. Unilateral primary osteoarthritis, left knee   4. Unilateral primary osteoarthritis, right knee     Plan: Avoid overhead lifting and overhead use of the arms. Do not lift greater than 5 lbs. Adjust head rest in vehicle to prevent hyperextension if rear ended. Take extra precautions to avoid falling, including use of a cane if you feel weak. Avoid overhead lifting and overhead use of the arms. Do not lift greater than 10 lbs. Tylenol ES one every 6-8 hours for pain and inflamation. Continue with PT for another 2-3 weeks, then a home exercise program. Ice and heat as needed for pain. Use  Follow-Up Instructions: Return in about 3 months (around 01/12/2020).   Orders:  Orders Placed This Encounter  Procedures  . Ambulatory referral to Physical Therapy   Meds ordered this encounter  Medications  . acetaminophen-codeine (TYLENOL #3) 300-30 MG tablet    Sig: TAKE 1 TABLET BY MOUTH EVERY 4 HOURS AS NEEDED FOR MODERATE PAIN    Dispense:  40 tablet    Refill:  0      Procedures: No procedures performed   Clinical Data: No additional findings.   Subjective: Chief Complaint  Patient presents with  . Lower Back - Follow-up  . Right Knee - Follow-up  . Left Knee - Follow-up    86 year old right handed female with history of bilateral knee osteoarthritis and is undergoing repetitive injections of the knees, the knees are doing fine. The neck is more painful and she is using aspercreame and using 2 pillows to sleep on at night. She has difficulty with sleeping at night, is on  Oxygen at night. It helps with the SOB. No bowel or bladder difficulty. The pain in the neck worse with side to side movement and extension She is not able to use her chin to hold her pillow when placing the cover on.   Review of Systems   Objective: Vital Signs: BP 111/68 (BP Location: Left Arm, Patient Position: Sitting)   Pulse 72   Ht 5' (1.524 m)   Wt 112 lb 3.2 oz (50.9 kg)   BMI 21.91 kg/m   Physical Exam Constitutional:      Appearance: She is well-developed.  HENT:     Head: Normocephalic and atraumatic.  Eyes:     Pupils: Pupils are equal, round, and reactive to light.  Pulmonary:     Effort: Pulmonary effort is normal.     Breath sounds: Normal breath sounds.  Abdominal:     General: Bowel sounds are normal.     Palpations: Abdomen is soft.  Musculoskeletal:     Cervical back: Normal range of motion and neck supple.     Lumbar back: Negative right straight leg raise test and negative left straight leg raise test.  Skin:    General: Skin is warm and dry.  Neurological:     Mental Status: She is alert and oriented  to person, place, and time.  Psychiatric:        Behavior: Behavior normal.        Thought Content: Thought content normal.        Judgment: Judgment normal.     Back Exam   Tenderness  The patient is experiencing tenderness in the cervical and lumbar.  Range of Motion  Extension: abnormal  Flexion: abnormal  Lateral bend right: abnormal  Lateral bend left: abnormal  Rotation right: abnormal  Rotation left: abnormal   Muscle Strength  Right Quadriceps:  5/5  Left Quadriceps:  5/5  Right Hamstrings:  5/5  Left Hamstrings:  5/5   Tests  Straight leg raise right: negative Straight leg raise left: negative  Reflexes  Patellar: 2/4 Achilles: 2/4 Biceps: 2/4 Babinski's sign: normal   Other  Toe walk: normal Heel walk: normal Sensation: normal Gait: normal  Erythema: no back redness Scars: absent  Comments:  At least 50% loss  of ROM of the neck.  No focal motor deficit.       Specialty Comments:  No specialty comments available.  Imaging: No results found.   PMFS History: Patient Active Problem List   Diagnosis Date Noted  . Postoperative anemia due to acute blood loss 11/23/2013    Priority: Low    Class: Acute  . Shoulder tendinitis 10/05/2014  . Spinal stenosis, lumbar region, with neurogenic claudication 11/21/2013   Past Medical History:  Diagnosis Date  . Anxiety   . Asthma   . Constipation   . COPD (chronic obstructive pulmonary disease) ([facility])   . COPD, severe ([facility])   . Coronary artery disease   . DDD (degenerative disc disease)   . GERD (gastroesophageal reflux disease)   . H/O echocardiogram 01/27/2013  . Hearing loss   . History of colon polyps   . History of nuclear stress test 12/20/2012  . Hyperlipemia, mixed   . Hypertension   . Hypothyroidism   . Mitral valve regurgitation   . Nocturnal leg cramps   . Peripheral vascular disease ([facility])    followed by Dr Maryjean Morn  . Rheumatoid arthritis ([facility])   . Shortness of breath    exertional  . UTI (urinary tract infection) 05/2013    History reviewed. No pertinent family history.  Past Surgical History:  Procedure Laterality Date  . ABDOMINAL HYSTERECTOMY    . CAROTID ENDARTERECTOMY Right 05/2013  . CATARACT EXTRACTION, BILATERAL    . CERVICAL FUSION  2012  . COLONOSCOPY W/ POLYPECTOMY  2008  . EYE SURGERY    . FOOT SURGERY Right   . VASCULAR SURGERY     Social History   Occupational History  . Not on file  Tobacco Use  . Smoking status: Former Smoker    Packs/day: 1.00    Years: 40.00    Pack years: 40.00    Types: Cigarettes    Quit date: 11/15/2003    Years since quitting: 15.9  . Smokeless tobacco: Never Used  Substance and Sexual Activity  . Alcohol use: No  . Drug use: No  . Sexual activity: Never

## 2019-10-12 NOTE — Patient Instructions (Signed)
Avoid overhead lifting and overhead use of the arms. Do not lift greater than 5 lbs. Adjust head rest in vehicle to prevent hyperextension if rear ended. Take extra precautions to avoid falling, including use of a cane if you feel weak. Avoid overhead lifting and overhead use of the arms. Do not lift greater than 10 lbs. Tylenol ES one every 6-8 hours for pain and inflamation. Continue with PT for another 2-3 weeks, then a home exercise program. Ice and heat as needed for pain. Use

## 2019-11-13 ENCOUNTER — Other Ambulatory Visit: Payer: Self-pay | Admitting: Specialist

## 2019-11-13 NOTE — Telephone Encounter (Signed)
Pls advise.  

## 2019-11-23 ENCOUNTER — Other Ambulatory Visit: Payer: Self-pay | Admitting: Specialist

## 2019-12-04 ENCOUNTER — Other Ambulatory Visit: Payer: Self-pay | Admitting: Specialist

## 2019-12-05 ENCOUNTER — Ambulatory Visit: Payer: Medicare Other | Attending: Specialist | Admitting: Physical Therapy

## 2019-12-05 ENCOUNTER — Other Ambulatory Visit: Payer: Self-pay

## 2019-12-05 ENCOUNTER — Encounter: Payer: Self-pay | Admitting: Physical Therapy

## 2019-12-05 DIAGNOSIS — R2681 Unsteadiness on feet: Secondary | ICD-10-CM | POA: Diagnosis present

## 2019-12-05 DIAGNOSIS — M542 Cervicalgia: Secondary | ICD-10-CM | POA: Diagnosis not present

## 2019-12-05 DIAGNOSIS — M6281 Muscle weakness (generalized): Secondary | ICD-10-CM

## 2019-12-05 DIAGNOSIS — R296 Repeated falls: Secondary | ICD-10-CM | POA: Diagnosis present

## 2019-12-05 DIAGNOSIS — R293 Abnormal posture: Secondary | ICD-10-CM

## 2019-12-05 NOTE — Therapy (Signed)
[facility] Outpatient Rehabilitation [facility] [address]  [address] [place], [place], [address] Phone: [phone]   Fax:  [phone]  Physical Therapy Evaluation  Patient Details  Name: Amber Yang MRN: [id number] Date of Birth: 05-27-1930 Referring Provider (PT): Vira Browns, MD   Encounter Date: 12/05/2019   PT End of Session - 12/05/19 1629    Visit Number 1    Number of Visits 9    Date for PT Re-Evaluation 01/30/20    Authorization Type Medicare & BCBS    PT Start Time 1533    PT Stop Time 1614    PT Time Calculation (min) 41 min    Activity Tolerance Patient tolerated treatment well    Behavior During Therapy [facility] for tasks assessed/performed           Past Medical History:  Diagnosis Date  . Anxiety   . Asthma   . Constipation   . COPD (chronic obstructive pulmonary disease) (HCC)   . COPD, severe (HCC)   . Coronary artery disease   . DDD (degenerative disc disease)   . GERD (gastroesophageal reflux disease)   . H/O echocardiogram 01/27/2013  . Hearing loss   . History of colon polyps   . History of nuclear stress test 12/20/2012  . Hyperlipemia, mixed   . Hypertension   . Hypothyroidism   . Mitral valve regurgitation   . Nocturnal leg cramps   . Peripheral vascular disease (HCC)    followed by Dr Sondra Come  . Rheumatoid arthritis (HCC)   . Shortness of breath    exertional  . UTI (urinary tract infection) 05/2013    Past Surgical History:  Procedure Laterality Date  . ABDOMINAL HYSTERECTOMY    . CAROTID ENDARTERECTOMY Right 05/2013  . CATARACT EXTRACTION, BILATERAL    . CERVICAL FUSION  2012  . COLONOSCOPY W/ POLYPECTOMY  2008  . EYE SURGERY    . FOOT SURGERY Right   . VASCULAR SURGERY      There were no vitals filed for this visit.    Subjective Assessment - 12/05/19 1536    Subjective Patient reports B cervical pain with radiation into the head and into the R arm, worse R vs. L for the past 1 month. Does report a hx of  cervical surgery (cervical fusion in 2012). Sleeps with supplemental O2 so she has to sleep sitting up, which bothers her back. Uses a cream on her neck for the pain which helps. Denies N/T in the UEs or B&B changes. Pain occurs "any time" but worse with head movement. Intermittently had HAs but not too often. Notes trouble with her balance and would like to work on this. Reports 2 falls in 2020 after which she had trouble getting up from the floor. Now using a SPC d/t imbalance.    Patient is accompained by: Family member   daughter   Pertinent History exertional SOB, RA, PVD, hypothyroidism, HTN, HLD, GERD, DDD, COPD, asthma, anxiety, cervical fusion 2012, R foot surgery    Limitations Reading;Lifting;Standing;Writing;House hold activities;Walking    Diagnostic tests none recent    Patient Stated Goals get rid of pain    Currently in Pain? Yes    Pain Score 6     Pain Location Neck    Pain Orientation Right    Pain Descriptors / Indicators Dull    Pain Type Acute pain    Pain Radiating Towards down the R arm to wrist  [facility] PT Assessment - 12/05/19 1544      Assessment   Medical Diagnosis Spondylosis without myelopathy or radiculopathy, cervical region    Referring Provider (PT) Vira Browns, MD    Onset Date/Surgical Date 11/04/19    Hand Dominance Right    Next MD Visit 01/08/20    Prior Therapy yes- for balance       Precautions   Precautions None      Balance Screen   Has the patient fallen in the past 6 months No    Has the patient had a decrease in activity level because of a fear of falling?  No    Is the patient reluctant to leave their home because of a fear of falling?  No      Home Environment   Living Environment Private residence    Living Arrangements Alone    Available Help at Discharge Family;Friend(s)    Type of Home House   double wide mobile home   Home Access Stairs to enter    Entrance Stairs-Number of Steps 4    Entrance Stairs-Rails  Right;Left    Home Layout One level    Home Equipment [place] - single point;Wheelchair - [employer] - standard      Prior Function   Level of Independence Independent    Vocation Retired    Leisure none      Cognition   Overall Cognitive Status Within Functional Limits for tasks assessed      Sensation   Light Touch Appears Intact      Coordination   Gross Motor Movements are Fluid and Coordinated Yes      Posture/Postural Control   Posture/Postural Control Postural limitations    Postural Limitations Rounded Shoulders;Forward head;Increased thoracic kyphosis    Posture Comments R thoracic concave curve with L shoulder elevated      ROM / Strength   AROM / PROM / Strength AROM;Strength      AROM   AROM Assessment Site Cervical    Cervical Flexion 20   "strain" in the R side of neck   Cervical Extension 24   "strain" in the R side of neck   Cervical - Right Side Bend 31    Cervical - Left Side Bend 30    Cervical - Right Rotation 42    Cervical - Left Rotation 46      Strength   Strength Assessment Site Shoulder;Elbow;Wrist;Hand    Right/Left Shoulder Right;Left    Right Shoulder Flexion 4/5    Right Shoulder ABduction 4/5    Right Shoulder Internal Rotation 4+/5    Right Shoulder External Rotation 4-/5    Left Shoulder Flexion 4+/5    Left Shoulder ABduction 4+/5    Left Shoulder Internal Rotation 4+/5    Left Shoulder External Rotation 4/5    Right/Left Elbow Right;Left    Right Elbow Flexion 5/5    Right Elbow Extension 4+/5    Left Elbow Flexion 4+/5    Left Elbow Extension 4/5    Right/Left Wrist Right;Left    Right Wrist Flexion 4+/5    Right Wrist Extension 4+/5    Left Wrist Flexion 4+/5    Left Wrist Extension 4+/5      Palpation   Palpation comment TTP over L UT and B pecs; increased tone and soft tissue restriction in B pecs, L UT, B suboccipitals and cervical paraspinals      Ambulation/Gait   Gait Pattern Step-to pattern;Step-through  pattern;Decreased step length -  right;Decreased step length - left;Poor foot clearance - right;Poor foot clearance - left;Trunk flexed;Right flexed knee in stance;Left flexed knee in stance   poor SPC sequencing   Ambulation Surface Level;Indoor    Gait velocity decreased                      Objective measurements completed on examination: See above findings.               PT Education - 12/05/19 1629    Education Details prognosis, POC, HEP- Access Code: [email or web address]    Person(s) Educated Patient    Methods Explanation;Demonstration;Tactile cues;Verbal cues;Handout    Comprehension Verbalized understanding;Returned demonstration            PT Short Term Goals - 12/05/19 1636      PT SHORT TERM GOAL #1   Title Pt will be independent with HEP    Time 3    Period Weeks    Status New    Target Date 12/26/19             PT Long Term Goals - 12/05/19 1636      PT LONG TERM GOAL #1   Title Pt will decrease normal TUG time by > 2 seconds to decrease risk for falls    Time 8    Period Weeks    Status New    Target Date 01/30/20      PT LONG TERM GOAL #2   Title Patient to report 70% improvement in neck pain.    Time 8    Period Weeks    Status New    Target Date 01/30/20      PT LONG TERM GOAL #3   Title Patient to demonstrate cervical AROM WFL without pain limiting.    Time 8    Period Weeks    Status New    Target Date 01/30/20      PT LONG TERM GOAL #4   Title Patient to demonstrate B shoulder strength >/=4+/5.    Time 8    Period Weeks    Status New    Target Date 01/30/20      PT LONG TERM GOAL #5   Title Pt will improve Berg score to 48/50 to improve ability to perform ADLs without LOB    Time 8    Period Weeks    Status New    Target Date 01/30/20                  Plan - 12/05/19 1629    Clinical Impression Statement Patient is an 86y/o F presenting to OPPT with c/o acute on chronic neck pain and imbalance. Pain  occurs over B sides of the neck with radiation down the R arm stopping at wrist. Denies N/T in the UEs or B&B changes. Pain occurs "any time" but worse with head movement. Reports 2 falls in 2020 after which she had trouble getting up from the floor. Now using a SPC d/t imbalance. Patient today presenting with limited and painful cervical AROM, abnormal posture, decreased B shoulder strength, R>L, TTP over L UT and B pecs with increased tone in B pecs, L UT, B suboccipitals and cervical paraspinals, and gait deviations. Patient was educated on gentle stretching and postural correction HEP and SPC adjusted to more appropriate height. Patient reported understanding. Would benefit from skilled PT services 1x/week for 8 weeks to address aforementioned impairments.    Personal Factors and Comorbidities Age;Comorbidity 3+;Fitness;Past/Current  Experience;Time since onset of injury/illness/exacerbation    Comorbidities exertional SOB, RA, PVD, hypothyroidism, HTN, HLD, GERD, DDD, COPD, asthma, anxiety, cervical fusion 2012, R foot surgery    Examination-Activity Limitations Bathing;Sleep;Bed Mobility;Bend;Squat;Stairs;Caring for Others;Carry;Stand;Toileting;Dressing;Transfers;Hygiene/Grooming;Lift;Locomotion Level;Reach Overhead    Examination-Participation Restrictions Church;Cleaning;School;Shop;Community Activity;Driving;Laundry;Meal Prep    Stability/Clinical Decision Making Stable/Uncomplicated    Clinical Decision Making Low    Rehab Potential Good    PT Frequency 1x / week    PT Duration 8 weeks    PT Treatment/Interventions ADLs/Self Care Home Management;Cryotherapy;Electrical Stimulation;Moist Heat;Balance training;Therapeutic exercise;Therapeutic activities;Functional mobility training;Stair training;Gait training;Ultrasound;Neuromuscular re-education;Patient/family education;Manual techniques;Taping;Energy conservation;Dry needling;Passive range of motion    PT Next Visit Plan cervical FOTO, berg, TUG,  progress cervical ROM and postural correction to tolerance    Consulted and Agree with Plan of Care Patient           Patient will benefit from skilled therapeutic intervention in order to improve the following deficits and impairments:  Hypomobility, Pain, Impaired UE functional use, Increased fascial restricitons, Decreased strength, Decreased activity tolerance, Decreased balance, Difficulty walking, Increased muscle spasms, Decreased range of motion, Improper body mechanics, Postural dysfunction, Impaired flexibility  Visit Diagnosis: Cervicalgia  Unsteadiness on feet  Repeated falls  Abnormal posture  Muscle weakness (generalized)     Problem List Patient Active Problem List   Diagnosis Date Noted  . Shoulder tendinitis 10/05/2014  . Postoperative anemia due to acute blood loss 11/23/2013    Class: Acute  . Spinal stenosis, lumbar region, with neurogenic claudication 11/21/2013     Anette Guarneri, PT, DPT 12/05/19 4:39 PM   [facility] Outpatient Rehabilitation [facility] [address]  [address] [place], [place], [address] Phone: [phone]   Fax:  [phone]  Name: Amber Yang MRN: [id number] Date of Birth: Nov 30, 1930

## 2019-12-08 ENCOUNTER — Other Ambulatory Visit: Payer: Self-pay | Admitting: Specialist

## 2019-12-08 NOTE — Telephone Encounter (Signed)
Pt would like a refill of her pain medicine and a CB when it's been sent in.  [phone]

## 2019-12-11 ENCOUNTER — Ambulatory Visit: Payer: Medicare Other | Admitting: Physical Therapy

## 2019-12-11 MED ORDER — ACETAMINOPHEN-CODEINE #3 300-30 MG PO TABS: ORAL_TABLET | ORAL | 0 refills | Status: DC

## 2019-12-11 NOTE — Telephone Encounter (Signed)
I called and lmom that her rx was sent to the pharmacy

## 2019-12-11 NOTE — Telephone Encounter (Signed)
Sent the request to Dr. Winifred Olive

## 2019-12-12 ENCOUNTER — Encounter: Payer: Self-pay | Admitting: Physical Therapy

## 2019-12-12 ENCOUNTER — Other Ambulatory Visit: Payer: Self-pay

## 2019-12-12 ENCOUNTER — Ambulatory Visit: Payer: Medicare Other | Admitting: Physical Therapy

## 2019-12-12 DIAGNOSIS — M542 Cervicalgia: Secondary | ICD-10-CM

## 2019-12-12 DIAGNOSIS — M6281 Muscle weakness (generalized): Secondary | ICD-10-CM

## 2019-12-12 DIAGNOSIS — R2681 Unsteadiness on feet: Secondary | ICD-10-CM

## 2019-12-12 DIAGNOSIS — R296 Repeated falls: Secondary | ICD-10-CM

## 2019-12-12 DIAGNOSIS — R293 Abnormal posture: Secondary | ICD-10-CM

## 2019-12-12 NOTE — Therapy (Signed)
[facility] Outpatient Rehabilitation [facility] [address]  [address] [place], [place], [address] Phone: [phone]   Fax:  [phone]  Physical Therapy Treatment  Patient Details  Name: Amber Yang MRN: [id number] Date of Birth: October 07, 1930 Referring Provider (PT): Vira Browns, MD   Encounter Date: 12/12/2019   PT End of Session - 12/12/19 1443    Visit Number 2    Number of Visits 9    Date for PT Re-Evaluation 01/30/20    Authorization Type Medicare & BCBS    PT Start Time 1356    PT Stop Time 1442    PT Time Calculation (min) 46 min    Equipment Utilized During Treatment Gait belt    Activity Tolerance Patient tolerated treatment well    Behavior During Therapy [facility] for tasks assessed/performed           Past Medical History:  Diagnosis Date  . Anxiety   . Asthma   . Constipation   . COPD (chronic obstructive pulmonary disease) (HCC)   . COPD, severe (HCC)   . Coronary artery disease   . DDD (degenerative disc disease)   . GERD (gastroesophageal reflux disease)   . H/O echocardiogram 01/27/2013  . Hearing loss   . History of colon polyps   . History of nuclear stress test 12/20/2012  . Hyperlipemia, mixed   . Hypertension   . Hypothyroidism   . Mitral valve regurgitation   . Nocturnal leg cramps   . Peripheral vascular disease (HCC)    followed by Dr Sondra Come  . Rheumatoid arthritis (HCC)   . Shortness of breath    exertional  . UTI (urinary tract infection) 05/2013    Past Surgical History:  Procedure Laterality Date  . ABDOMINAL HYSTERECTOMY    . CAROTID ENDARTERECTOMY Right 05/2013  . CATARACT EXTRACTION, BILATERAL    . CERVICAL FUSION  2012  . COLONOSCOPY W/ POLYPECTOMY  2008  . EYE SURGERY    . FOOT SURGERY Right   . VASCULAR SURGERY      There were no vitals filed for this visit.   Subjective Assessment - 12/12/19 1357    Subjective Has a lot going on. Has worked on her HEP but not daily.    Pertinent History exertional  SOB, RA, PVD, hypothyroidism, HTN, HLD, GERD, DDD, COPD, asthma, anxiety, cervical fusion 2012, R foot surgery    Diagnostic tests none recent    Patient Stated Goals get rid of pain    Currently in Pain? Yes    Pain Score 6     Pain Location Neck    Pain Orientation Right    Pain Descriptors / Indicators Dull    Pain Type Acute pain              [facility] PT Assessment - 12/12/19 0001      Observation/Other Assessments   Focus on Therapeutic Outcomes (FOTO)  Cervical: 52% (48% limited, 44% predicted)      Standardized Balance Assessment   Standardized Balance Assessment Timed Up and Go Test;Berg Balance Test      Berg Balance Test   Sit to Stand Able to stand without using hands and stabilize independently    Standing Unsupported Able to stand safely 2 minutes    Sitting with Back Unsupported but Feet Supported on Floor or Stool Able to sit safely and securely 2 minutes    Stand to Sit Controls descent by using hands    Transfers Able to  transfer safely, minor use of hands    Standing Unsupported with Eyes Closed Able to stand 10 seconds safely    Standing Unsupported with Feet Together Able to place feet together independently and stand 1 minute safely    From Standing, Reach Forward with Outstretched Arm Can reach forward >5 cm safely (2")    From Standing Position, Pick up Object from Floor Able to pick up shoe, needs supervision    From Standing Position, Turn to Look Behind Over each Shoulder Turn sideways only but maintains balance    Turn 360 Degrees Needs close supervision or verbal cueing    Standing Unsupported, Alternately Place Feet on Step/Stool Able to complete 4 steps without aid or supervision    Standing Unsupported, One Foot in [employer] balance while stepping or standing    Standing on One Leg Tries to lift leg/unable to hold 3 seconds but remains standing independently    Total Score 38      Timed Up and Go Test   Normal TUG (seconds) 15.67   with SPC                         [facility] Adult PT Treatment/Exercise - 12/12/19 0001      Exercises   Exercises Neck      Neck Exercises: Machines for Strengthening   Nustep L1 x 6 min (UEs/LEs)      Neck Exercises: Seated   Neck Retraction 10 reps    Neck Retraction Limitations 10x3"   limited ROM but good form   Shoulder Rolls Backwards;Forwards;10 reps    Shoulder Rolls Limitations manual cueing for proper form and increased ROM    Other Seated Exercise scapular retraction 10x3"   cues to avoid shoulder elevation     Neck Exercises: Stretches   Other Neck Stretches behind the head pec stretch 3x15" to tolerance                  PT Education - 12/12/19 1443    Education Details discussion on test results and clinical correlation    Person(s) Educated Patient    Methods Explanation;Demonstration;Tactile cues;Verbal cues    Comprehension Verbalized understanding            PT Short Term Goals - 12/12/19 1446      PT SHORT TERM GOAL #1   Title Pt will be independent with HEP    Time 3    Period Weeks    Status On-going    Target Date 12/26/19             PT Long Term Goals - 12/12/19 1446      PT LONG TERM GOAL #1   Title Pt will decrease normal TUG time by > 2 seconds to decrease risk for falls    Time 8    Period Weeks    Status On-going      PT LONG TERM GOAL #2   Title Patient to report 70% improvement in neck pain.    Time 8    Period Weeks    Status On-going      PT LONG TERM GOAL #3   Title Patient to demonstrate cervical AROM WFL without pain limiting.    Time 8    Period Weeks    Status On-going      PT LONG TERM GOAL #4   Title Patient to demonstrate B shoulder strength >/=4+/5.    Time 8  Period Weeks    Status On-going      PT LONG TERM GOAL #5   Title Pt will improve Berg score to 48/50 to improve ability to perform ADLs without LOB    Time 8    Period Weeks    Status On-going                 Plan - 12/12/19  1444    Clinical Impression Statement Patient without new complaints today. Reports performing HEP and denies any particular questions/problems. Balance was assessed today- patient's TUG time indicates an increased risk of falls. Patient also demonstrated increased risk of falls according to [facility] score, with most difficulty demonstrated with standing rotation and single leg activities. Reviewed cervical HEP for max carryover. Patient overall with good tolerance for these activities, but required intermittent cueing for proper form and hold time for each exercise. Patient without complaints at end of session.    Comorbidities exertional SOB, RA, PVD, hypothyroidism, HTN, HLD, GERD, DDD, COPD, asthma, anxiety, cervical fusion 2012, R foot surgery    PT Treatment/Interventions ADLs/Self Care Home Management;Cryotherapy;Electrical Stimulation;Moist Heat;Balance training;Therapeutic exercise;Therapeutic activities;Functional mobility training;Stair training;Gait training;Ultrasound;Neuromuscular re-education;Patient/family education;Manual techniques;Taping;Energy conservation;Dry needling;Passive range of motion    PT Next Visit Plan initiate balance HEP for single leg activities, progress cervical ROM and postural correction to tolerance    Consulted and Agree with Plan of Care Patient           Patient will benefit from skilled therapeutic intervention in order to improve the following deficits and impairments:  Hypomobility, Pain, Impaired UE functional use, Increased fascial restricitons, Decreased strength, Decreased activity tolerance, Decreased balance, Difficulty walking, Increased muscle spasms, Decreased range of motion, Improper body mechanics, Postural dysfunction, Impaired flexibility  Visit Diagnosis: Cervicalgia  Unsteadiness on feet  Repeated falls  Abnormal posture  Muscle weakness (generalized)     Problem List Patient Active Problem List   Diagnosis Date Noted  . Shoulder  tendinitis 10/05/2014  . Postoperative anemia due to acute blood loss 11/23/2013    Class: Acute  . Spinal stenosis, lumbar region, with neurogenic claudication 11/21/2013     Anette Guarneri, PT, DPT 12/12/19 2:48 PM   [facility]patient [facility] [address]  [address] [place], [place], [address] Phone: [phone]   Fax:  [phone]  Name: Amber Yang MRN: [id number] Date of Birth: [birth year]

## 2019-12-19 ENCOUNTER — Ambulatory Visit: Payer: Medicare Other

## 2019-12-19 ENCOUNTER — Other Ambulatory Visit: Payer: Self-pay

## 2019-12-19 DIAGNOSIS — M6281 Muscle weakness (generalized): Secondary | ICD-10-CM

## 2019-12-19 DIAGNOSIS — R293 Abnormal posture: Secondary | ICD-10-CM

## 2019-12-19 DIAGNOSIS — R2681 Unsteadiness on feet: Secondary | ICD-10-CM

## 2019-12-19 DIAGNOSIS — R296 Repeated falls: Secondary | ICD-10-CM

## 2019-12-19 DIAGNOSIS — M542 Cervicalgia: Secondary | ICD-10-CM

## 2019-12-19 NOTE — Therapy (Signed)
[facility] Outpatient Rehabilitation [facility] [address]  [address] [place], [place], [address] Phone: [phone]   Fax:  [phone]  Physical Therapy Treatment  Patient Details  Name: Amber Yang MRN: [id number] Date of Birth: Nov 04, 1930 Referring Provider (PT): Vira Browns, MD   Encounter Date: 12/19/2019   PT End of Session - 12/19/19 1326    Visit Number 3    Number of Visits 9    Date for PT Re-Evaluation 01/30/20    Authorization Type Medicare & BCBS    PT Start Time 1321   pt. arrived late to session   PT Stop Time 1359    PT Time Calculation (min) 38 min    Equipment Utilized During Treatment Gait belt    Activity Tolerance Patient tolerated treatment well    Behavior During Therapy [facility] for tasks assessed/performed           Past Medical History:  Diagnosis Date  . Anxiety   . Asthma   . Constipation   . COPD (chronic obstructive pulmonary disease) (HCC)   . COPD, severe (HCC)   . Coronary artery disease   . DDD (degenerative disc disease)   . GERD (gastroesophageal reflux disease)   . H/O echocardiogram 01/27/2013  . Hearing loss   . History of colon polyps   . History of nuclear stress test 12/20/2012  . Hyperlipemia, mixed   . Hypertension   . Hypothyroidism   . Mitral valve regurgitation   . Nocturnal leg cramps   . Peripheral vascular disease (HCC)    followed by Dr Sondra Come  . Rheumatoid arthritis (HCC)   . Shortness of breath    exertional  . UTI (urinary tract infection) 05/2013    Past Surgical History:  Procedure Laterality Date  . ABDOMINAL HYSTERECTOMY    . CAROTID ENDARTERECTOMY Right 05/2013  . CATARACT EXTRACTION, BILATERAL    . CERVICAL FUSION  2012  . COLONOSCOPY W/ POLYPECTOMY  2008  . EYE SURGERY    . FOOT SURGERY Right   . VASCULAR SURGERY      There were no vitals filed for this visit.   Subjective Assessment - 12/19/19 1324    Subjective had some neck pain after HEP however unsure which exerise  bothered her.    Pertinent History exertional SOB, RA, PVD, hypothyroidism, HTN, HLD, GERD, DDD, COPD, asthma, anxiety, cervical fusion 2012, R foot surgery    Diagnostic tests none recent    Patient Stated Goals get rid of pain    Currently in Pain? No/denies    Pain Score 0-No pain   up to 10/10 at times   Pain Location Neck    Pain Orientation Right    Pain Descriptors / Indicators Aching    Pain Type Acute pain    Multiple Pain Sites Yes    Pain Score 5    Pain Location Leg    Pain Orientation Left    Pain Descriptors / Indicators Numbness    Pain Type Chronic pain                             [facility] Adult PT Treatment/Exercise - 12/19/19 0001      Neuro Re-ed    Neuro Re-ed Details  side stepping at counter x 3 laps       Neck Exercises: Machines for Strengthening   Nustep L2 x 6 min (UEs/LEs)      Knee/Hip  Exercises: Stretches   Theme park manager Left;Right;2 reps;20 seconds    Theme park manager Limitations runners stretch       Knee/Hip Exercises: Standing   Heel Raises Both;15 reps    Heel Raises Limitations B heel raise/toe raise     Hip Flexion Right;Left;10 reps;Knee bent;Stengthening    Hip Flexion Limitations at counter     Hip Abduction Right;Left;10 reps;Knee straight    Abduction Limitations B     SLS B SLS 3 x 10 sec at counter       Knee/Hip Exercises: Seated   Other Seated Knee/Hip Exercises B DF x 15 reps                  PT Education - 12/19/19 1428    Education Details HEP update;  heel raise at counter, hip flexion, hip abduction, seated toe raies    Person(s) Educated Patient    Methods Explanation;Demonstration;Verbal cues;Handout    Comprehension Verbalized understanding;Returned demonstration;Verbal cues required            PT Short Term Goals - 12/12/19 1446      PT SHORT TERM GOAL #1   Title Pt will be independent with HEP    Time 3    Period Weeks    Status On-going    Target Date 12/26/19              PT Long Term Goals - 12/12/19 1446      PT LONG TERM GOAL #1   Title Pt will decrease normal TUG time by > 2 seconds to decrease risk for falls    Time 8    Period Weeks    Status On-going      PT LONG TERM GOAL #2   Title Patient to report 70% improvement in neck pain.    Time 8    Period Weeks    Status On-going      PT LONG TERM GOAL #3   Title Patient to demonstrate cervical AROM WFL without pain limiting.    Time 8    Period Weeks    Status On-going      PT LONG TERM GOAL #4   Title Patient to demonstrate B shoulder strength >/=4+/5.    Time 8    Period Weeks    Status On-going      PT LONG TERM GOAL #5   Title Pt will improve Berg score to 48/50 to improve ability to perform ADLs without LOB    Time 8    Period Weeks    Status On-going                 Plan - 12/19/19 1327    Clinical Impression Statement Amber Yang reporting she has been performing her "neck exercises".  Does note increased neck pain yesterday however with conversation did not feel HEP activities contributed to this.  Initiated standing balance activities focusing on improving dynamic single leg stance balance.  Counter support utilized with all standing balance activities for safety and pt. instructed to use counter support at home when performing new HEP handout issued to her (see pt. education section).  Pt. leaving session pain free and noting LE fatigue.    Comorbidities exertional SOB, RA, PVD, hypothyroidism, HTN, HLD, GERD, DDD, COPD, asthma, anxiety, cervical fusion 2012, R foot surgery    Rehab Potential Good    PT Frequency 1x / week    PT Duration 8 weeks    PT Treatment/Interventions ADLs/Self Care Home  Management;Cryotherapy;Electrical Stimulation;Moist Heat;Balance training;Therapeutic exercise;Therapeutic activities;Functional mobility training;Stair training;Gait training;Ultrasound;Neuromuscular re-education;Patient/family education;Manual techniques;Taping;Energy  conservation;Dry needling;Passive range of motion    PT Next Visit Plan initiate balance HEP for single leg activities, progress cervical ROM and postural correction to tolerance    Consulted and Agree with Plan of Care Patient           Patient will benefit from skilled therapeutic intervention in order to improve the following deficits and impairments:  Hypomobility, Pain, Impaired UE functional use, Increased fascial restricitons, Decreased strength, Decreased activity tolerance, Decreased balance, Difficulty walking, Increased muscle spasms, Decreased range of motion, Improper body mechanics, Postural dysfunction, Impaired flexibility  Visit Diagnosis: Cervicalgia  Unsteadiness on feet  Repeated falls  Abnormal posture  Muscle weakness (generalized)     Problem List Patient Active Problem List   Diagnosis Date Noted  . Shoulder tendinitis 10/05/2014  . Postoperative anemia due to acute blood loss 11/23/2013    Class: Acute  . Spinal stenosis, lumbar region, with neurogenic claudication 11/21/2013    Kermit Balo, PTA 12/19/19 2:57 PM   [facility] Outpatient Rehabilitation [facility] [address]  [address] [place], [place], [address] Phone: [phone]   Fax:  [phone]  Name: Amber Yang MRN: [id number] Date of Birth: 11-18-1930

## 2019-12-26 ENCOUNTER — Ambulatory Visit: Payer: Medicare Other | Admitting: Physical Therapy

## 2019-12-26 ENCOUNTER — Encounter: Payer: Self-pay | Admitting: Physical Therapy

## 2019-12-26 ENCOUNTER — Other Ambulatory Visit: Payer: Self-pay

## 2019-12-26 DIAGNOSIS — M542 Cervicalgia: Secondary | ICD-10-CM | POA: Diagnosis not present

## 2019-12-26 DIAGNOSIS — R296 Repeated falls: Secondary | ICD-10-CM

## 2019-12-26 DIAGNOSIS — R293 Abnormal posture: Secondary | ICD-10-CM

## 2019-12-26 DIAGNOSIS — M6281 Muscle weakness (generalized): Secondary | ICD-10-CM

## 2019-12-26 DIAGNOSIS — R2681 Unsteadiness on feet: Secondary | ICD-10-CM

## 2019-12-26 NOTE — Therapy (Signed)
[facility] Outpatient Rehabilitation [facility] [address]  [address] [place], [place], [address] Phone: [phone]   Fax:  [phone]  Physical Therapy Treatment  Patient Details  Name: Amber Yang MRN: [id number] Date of Birth: 1930/07/01 Referring Provider (PT): Vira Browns, MD   Encounter Date: 12/26/2019   PT End of Session - 12/26/19 1401    Visit Number 4    Number of Visits 9    Date for PT Re-Evaluation 01/30/20    Authorization Type Medicare & BCBS    PT Start Time 1318    PT Stop Time 1359    PT Time Calculation (min) 41 min    Activity Tolerance Patient tolerated treatment well    Behavior During Therapy [facility] for tasks assessed/performed           Past Medical History:  Diagnosis Date  . Anxiety   . Asthma   . Constipation   . COPD (chronic obstructive pulmonary disease) (HCC)   . COPD, severe (HCC)   . Coronary artery disease   . DDD (degenerative disc disease)   . GERD (gastroesophageal reflux disease)   . H/O echocardiogram 01/27/2013  . Hearing loss   . History of colon polyps   . History of nuclear stress test 12/20/2012  . Hyperlipemia, mixed   . Hypertension   . Hypothyroidism   . Mitral valve regurgitation   . Nocturnal leg cramps   . Peripheral vascular disease (HCC)    followed by Dr Sondra Come  . Rheumatoid arthritis (HCC)   . Shortness of breath    exertional  . UTI (urinary tract infection) 05/2013    Past Surgical History:  Procedure Laterality Date  . ABDOMINAL HYSTERECTOMY    . CAROTID ENDARTERECTOMY Right 05/2013  . CATARACT EXTRACTION, BILATERAL    . CERVICAL FUSION  2012  . COLONOSCOPY W/ POLYPECTOMY  2008  . EYE SURGERY    . FOOT SURGERY Right   . VASCULAR SURGERY      There were no vitals filed for this visit.   Subjective Assessment - 12/26/19 1316    Subjective Has good days and bad days- her neuropathy is bothering her in her legs today.Has had neuropathy for the past year or so and also notes back  pain. Reports daily compliance with HEP and denies problems/questions.    Pertinent History exertional SOB, RA, PVD, hypothyroidism, HTN, HLD, GERD, DDD, COPD, asthma, anxiety, cervical fusion 2012, R foot surgery    Diagnostic tests none recent    Patient Stated Goals get rid of pain    Currently in Pain? Yes    Pain Score 5     Pain Location Head   and posterior neck   Pain Orientation Anterior    Pain Descriptors / Indicators Aching    Pain Type Acute pain    Pain Score 7    Pain Location Leg   lower legs   Pain Orientation Right;Left    Pain Descriptors / Indicators Numbness    Pain Type Chronic pain                             [facility] Adult PT Treatment/Exercise - 12/26/19 0001      Neuro Re-ed    Neuro Re-ed Details  sidestepping over beanbags without UE support 4x length of counter; R/L 1/2 tandem 30" each; alt toe tap on cone 2x10 with 2 fingers support on counter  more difficulty sidestep to R; cues to lift chest     Neck Exercises: Seated   Neck Retraction 10 reps    Neck Retraction Limitations 10x3" into ball   cues to avoid full trunk extension   Other Seated Exercise red TB row 2x10   cues to avoid scapular elevation     Manual Therapy   Manual Therapy --      Neck Exercises: Stretches   Other Neck Stretches 90/90 pec stretch in corner 2x30"   cues to avoid pushing into pain                 PT Education - 12/26/19 1400    Education Details update to HEP    Person(s) Educated Patient    Methods Explanation;Demonstration;Tactile cues;Verbal cues;Handout    Comprehension Verbalized understanding;Returned demonstration            PT Short Term Goals - 12/26/19 1409      PT SHORT TERM GOAL #1   Title Pt will be independent with HEP    Time 3    Period Weeks    Status Achieved    Target Date 12/26/19             PT Long Term Goals - 12/12/19 1446      PT LONG TERM GOAL #1   Title Pt will decrease normal TUG time by > 2  seconds to decrease risk for falls    Time 8    Period Weeks    Status On-going      PT LONG TERM GOAL #2   Title Patient to report 70% improvement in neck pain.    Time 8    Period Weeks    Status On-going      PT LONG TERM GOAL #3   Title Patient to demonstrate cervical AROM WFL without pain limiting.    Time 8    Period Weeks    Status On-going      PT LONG TERM GOAL #4   Title Patient to demonstrate B shoulder strength >/=4+/5.    Time 8    Period Weeks    Status On-going      PT LONG TERM GOAL #5   Title Pt will improve Berg score to 48/50 to improve ability to perform ADLs without LOB    Time 8    Period Weeks    Status On-going                 Plan - 12/26/19 1404    Clinical Impression Statement Patient reporting increased pain in B LEs d/t "neuropathy" also reporting HA and neck pain which began today. Initiated seated periscapular and deep neck flexor strengthening with added resistance. Patient with tendency to elevate shoulders, requiring consistent cueing to correct. Patient reported improvement in HA after ther-ex. Continued with balance training with focus on narrow BOS and dynamic activities. Patient demonstrated more difficulty performing sidestepping to R vs. L. Also required consistent cueing to avoid anterior trunk lean with standing activities. Patient reported understanding of HEP update and without complaints at end of session.    Comorbidities exertional SOB, RA, PVD, hypothyroidism, HTN, HLD, GERD, DDD, COPD, asthma, anxiety, cervical fusion 2012, R foot surgery    Rehab Potential Good    PT Frequency 1x / week    PT Duration 8 weeks    PT Treatment/Interventions ADLs/Self Care Home Management;Cryotherapy;Electrical Stimulation;Moist Heat;Balance training;Therapeutic exercise;Therapeutic activities;Functional mobility training;Stair training;Gait training;Ultrasound;Neuromuscular re-education;Patient/family education;Manual techniques;Taping;Energy  conservation;Dry  needling;Passive range of motion    PT Next Visit Plan progress balance training with narrow BOS and SLS;  progress cervical ROM and postural correction to tolerance    Consulted and Agree with Plan of Care Patient           Patient will benefit from skilled therapeutic intervention in order to improve the following deficits and impairments:  Hypomobility, Pain, Impaired UE functional use, Increased fascial restricitons, Decreased strength, Decreased activity tolerance, Decreased balance, Difficulty walking, Increased muscle spasms, Decreased range of motion, Improper body mechanics, Postural dysfunction, Impaired flexibility  Visit Diagnosis: Cervicalgia  Unsteadiness on feet  Repeated falls  Abnormal posture  Muscle weakness (generalized)     Problem List Patient Active Problem List   Diagnosis Date Noted  . Shoulder tendinitis 10/05/2014  . Postoperative anemia due to acute blood loss 11/23/2013    Class: Acute  . Spinal stenosis, lumbar region, with neurogenic claudication 11/21/2013     Anette Guarneri, PT, DPT 12/26/19 2:11 PM   [facility] Outpatient Rehabilitation [facility] [address]  [address] [place], [place], [address] Phone: [phone]   Fax:  [phone]  Name: ZHANE CERNY MRN: [id number] Date of Birth: [birth year]

## 2020-01-02 ENCOUNTER — Ambulatory Visit: Payer: Medicare Other | Attending: Specialist

## 2020-01-02 ENCOUNTER — Other Ambulatory Visit: Payer: Self-pay

## 2020-01-02 DIAGNOSIS — M6281 Muscle weakness (generalized): Secondary | ICD-10-CM | POA: Diagnosis present

## 2020-01-02 DIAGNOSIS — R296 Repeated falls: Secondary | ICD-10-CM

## 2020-01-02 DIAGNOSIS — M542 Cervicalgia: Secondary | ICD-10-CM | POA: Insufficient documentation

## 2020-01-02 DIAGNOSIS — R293 Abnormal posture: Secondary | ICD-10-CM | POA: Diagnosis present

## 2020-01-02 DIAGNOSIS — R2681 Unsteadiness on feet: Secondary | ICD-10-CM | POA: Diagnosis present

## 2020-01-02 NOTE — Therapy (Signed)
[facility] [address]  [place], [place], [address] Phone: [phone]   Fax:  [phone]  Physical Therapy Treatment  Patient Details  Name: Amber Yang MRN: [id number] Date of Birth: 1931/02/03 Referring Provider (PT): Amber Dess, MD   Encounter Date: 01/02/2020   PT End of Session - 01/02/20 1320    Visit Number 5    Number of Visits 9    Date for PT Re-Evaluation 01/30/20    Authorization Type Medicare & BCBS    PT Start Time 1314    PT Stop Time 1354    PT Time Calculation (min) 40 min    Activity Tolerance Patient tolerated treatment well    Behavior During Therapy [facility] for tasks assessed/performed           Past Medical History:  Diagnosis Date  . Anxiety   . Asthma   . Constipation   . COPD (chronic obstructive pulmonary disease) ([place])   . COPD, severe ([place])   . Coronary artery disease   . DDD (degenerative disc disease)   . GERD (gastroesophageal reflux disease)   . H/O echocardiogram 01/27/2013  . Hearing loss   . History of colon polyps   . History of nuclear stress test 12/20/2012  . Hyperlipemia, mixed   . Hypertension   . Hypothyroidism   . Mitral valve regurgitation   . Nocturnal leg cramps   . Peripheral vascular disease ([place])    followed by Dr Amber Yang  . Rheumatoid arthritis ([place])   . Shortness of breath    exertional  . UTI (urinary tract infection) 05/2013    Past Surgical History:  Procedure Laterality Date  . ABDOMINAL HYSTERECTOMY    . CAROTID ENDARTERECTOMY Right 05/2013  . CATARACT EXTRACTION, BILATERAL    . CERVICAL FUSION  2012  . COLONOSCOPY W/ POLYPECTOMY  2008  . EYE SURGERY    . FOOT SURGERY Right   . VASCULAR SURGERY      There were no vitals filed for this visit.   Subjective Assessment - 01/02/20 1317    Subjective Pt. noting increased LBP and neck pain today without known trigger.  "its the neuropathic and arthritic pain".    Pertinent History  exertional SOB, RA, PVD, hypothyroidism, HTN, HLD, GERD, DDD, COPD, asthma, anxiety, cervical fusion 2012, R foot surgery    Diagnostic tests none recent    Patient Stated Goals get rid of pain    Currently in Pain? Yes    Pain Score 9     Pain Location Neck    Pain Orientation Posterior    Pain Descriptors / Indicators Aching    Pain Type Acute pain    Pain Radiating Towards radiating into R arm    Pain Score 0   up to 9/10 pain with standing and walking   Pain Location Back    Pain Orientation Right;Left;Lower    Pain Descriptors / Indicators Sharp    Pain Type Chronic pain              [facility] PT Assessment - 01/02/20 0001      AROM   AROM Assessment Site Cervical    Cervical Flexion 26    Cervical Extension 38    Cervical - Right Side Bend 32    Cervical - Left Side Bend 30    Cervical - Right Rotation 42    Cervical - Left Rotation 55      Strength  Right/Left Shoulder Right;Left    Right Shoulder Flexion 4/5    Right Shoulder ABduction 4+/5    Right Shoulder Internal Rotation 4+/5    Right Shoulder External Rotation 4/5    Left Shoulder Flexion 4+/5    Left Shoulder ABduction 4+/5    Left Shoulder Internal Rotation 4+/5    Left Shoulder External Rotation 4/5      Berg Balance Test   Sit to Stand Able to stand without using hands and stabilize independently    Standing Unsupported Able to stand safely 2 minutes    Sitting with Back Unsupported but Feet Supported on Floor or Stool Able to sit safely and securely 2 minutes    Stand to Sit Sits safely with minimal use of hands    Transfers Able to transfer safely, minor use of hands    Standing Unsupported with Eyes Closed Able to stand 10 seconds safely    Standing Unsupported with Feet Together Able to place feet together independently and stand 1 minute safely    From Standing, Reach Forward with Outstretched Arm Can reach forward >12 cm safely (5")    From Standing Position, Pick up Object from Floor Able to  pick up shoe, needs supervision    From Standing Position, Turn to Look Behind Over each Shoulder Turn sideways only but maintains balance    Turn 360 Degrees Needs close supervision or verbal cueing    Standing Unsupported, Alternately Place Feet on Step/Stool Able to stand independently and complete 8 steps >20 seconds    Standing Unsupported, One Foot in Front Able to plae foot ahead of the other independently and hold 30 seconds    Standing on One Leg Tries to lift leg/unable to hold 3 seconds but remains standing independently    Total Score 44      Timed Up and Go Test   Normal TUG (seconds) 13.37   with SPC trial 1; trial #2 without AD 12.07 sec                                   PT Short Term Goals - 12/26/19 1409      PT SHORT TERM GOAL #1   Title Pt will be independent with HEP    Time 3    Period Weeks    Status Achieved    Target Date 12/26/19             PT Long Term Goals - 01/02/20 1337      PT LONG TERM GOAL #1   Title Pt will decrease normal TUG time by > 2 seconds to decrease risk for falls    Time 8    Period Weeks    Status Achieved      PT LONG TERM GOAL #2   Title Patient to report 70% improvement in neck pain.    Time 8    Period Weeks    Status On-going   01/02/20:  Unable to assign a %     PT LONG TERM GOAL #3   Title Patient to demonstrate cervical AROM WFL without pain limiting.    Time 8    Period Weeks    Status On-going   01/02/20: pt. demonstrating improved Cervical AROM flexion, extension, rotation     PT LONG TERM GOAL #4   Title Patient to demonstrate B shoulder strength >/=4+/5.    Time 8  Period Weeks    Status Partially Met   01/02/20: met for all with exception of R shoulder flexion 4/5, and B shoulder ER strength 4/5     PT LONG TERM GOAL #5   Title Pt will improve Berg score to 48/50 to improve ability to perform ADLs without LOB    Time 8    Period Weeks    Status Partially Met   01/02/20:  Improved BERG balance score of 44/56                Plan - 01/02/20 1454    Clinical Impression Statement Amber Yang has made good initial progress with physical therapy.  Denies recent falls.  Able to demonstrate improved TUG time by >2 seconds with and without SPC demonstrating improved functional mobility and decreased fall risk.  LTG #1 achieved.  Pt. noting improved neck pain since starting therapy however unable to quantify progress.  LTG #2 ongoing.  Pt. able to demonstrate significant improvement in L cervical AROM rotation, flexion and extension ROM.  Pt. remains limited in cervical AROM B side bending and R rotation nearly unchanged today with painful end ranges of motion.  LTG #3 ongoing.  Pt. able to partially achieve LTG #4 demonstrating improved overall shoulder strength with MMT meeting goal for B shoulder abduction, IR, L shoulder flexion strength 4+/5.  B shoulder ER and R shoulder flexion at 4/5 strength with MMT.  Pt. able to partially achieve LTG #5 demonstrating improved BERG Balance score to 44/56 from 38/56 when previously tested on 08/17 demonstrating improved static balance and safety.  Still with limited standing stability requiring supervision for safety with dynamic SLS tasks and LE clearance/stepping activities secondary to poor LE clearance.    Comorbidities exertional SOB, RA, PVD, hypothyroidism, HTN, HLD, GERD, DDD, COPD, asthma, anxiety, cervical fusion 2012, R foot surgery    Rehab Potential Good    PT Frequency 1x / week    PT Treatment/Interventions ADLs/Self Care Home Management;Cryotherapy;Electrical Stimulation;Moist Heat;Balance training;Therapeutic exercise;Therapeutic activities;Functional mobility training;Stair training;Gait training;Ultrasound;Neuromuscular re-education;Patient/family education;Manual techniques;Taping;Energy conservation;Dry needling;Passive range of motion    PT Next Visit Plan progress balance training with narrow BOS and SLS;  progress  cervical ROM and postural correction to tolerance    Consulted and Agree with Plan of Care Patient           Patient will benefit from skilled therapeutic intervention in order to improve the following deficits and impairments:  Hypomobility, Pain, Impaired UE functional use, Increased fascial restricitons, Decreased strength, Decreased activity tolerance, Decreased balance, Difficulty walking, Increased muscle spasms, Decreased range of motion, Improper body mechanics, Postural dysfunction, Impaired flexibility  Visit Diagnosis: Cervicalgia  Unsteadiness on feet  Repeated falls  Abnormal posture  Muscle weakness (generalized)     Problem List Patient Active Problem List   Diagnosis Date Noted  . Shoulder tendinitis 10/05/2014  . Postoperative anemia due to acute blood loss 11/23/2013    Class: Acute  . Spinal stenosis, lumbar region, with neurogenic claudication 11/21/2013    Bess Harvest, PTA 01/02/20 3:08 PM   [facility] [address]  [place], [place], [address] Phone: [phone]   Fax:  [phone]  Name: Amber Yang MRN: [id number] Date of Birth: [birth year]

## 2020-01-08 ENCOUNTER — Ambulatory Visit (INDEPENDENT_AMBULATORY_CARE_PROVIDER_SITE_OTHER): Payer: Medicare Other | Admitting: Specialist

## 2020-01-08 ENCOUNTER — Other Ambulatory Visit: Payer: Self-pay

## 2020-01-08 ENCOUNTER — Encounter: Payer: Self-pay | Admitting: Specialist

## 2020-01-08 VITALS — BP 149/83 | HR 76 | Ht 60.0 in | Wt 113.0 lb

## 2020-01-08 DIAGNOSIS — M25511 Pain in right shoulder: Secondary | ICD-10-CM

## 2020-01-08 DIAGNOSIS — M25512 Pain in left shoulder: Secondary | ICD-10-CM

## 2020-01-08 DIAGNOSIS — G609 Hereditary and idiopathic neuropathy, unspecified: Secondary | ICD-10-CM

## 2020-01-08 DIAGNOSIS — G8929 Other chronic pain: Secondary | ICD-10-CM

## 2020-01-08 DIAGNOSIS — M47812 Spondylosis without myelopathy or radiculopathy, cervical region: Secondary | ICD-10-CM

## 2020-01-08 DIAGNOSIS — M4807 Spinal stenosis, lumbosacral region: Secondary | ICD-10-CM

## 2020-01-08 MED ORDER — BIOFREEZE ROLL-ON 4 % EX GEL: CUTANEOUS | 2 refills | Status: DC

## 2020-01-08 MED ORDER — DICLOFENAC SODIUM 1 % EX GEL: CUTANEOUS | 6 refills | Status: AC

## 2020-01-08 NOTE — Patient Instructions (Addendum)
Plan: Knee is suffering from osteoarthritis, only real proven treatments areAvoid overhead lifting and overhead use of the arms. Do not lift greater than 5 lbs. Adjust head rest in vehicle to prevent hyperextension if rear ended. Take extra precautions to avoid falling.Well padded shoes help. Ice the knee that is suffering from osteoarthritis, only real proven treatments are Weight loss, avoid oral NSIADs like motrin, alleve and diclofenac and exercise. May use voltaren gel. Well padded shoes help. Ice the knee 2-3 times a day 15-20 mins at a time.-3 times a day 15-20 mins at a time. Hot showers in the AM.  Injection with steroid may be of benefit. Hemp CBD capsules, amazon.com 5,000-7,000 mg per bottle, 60 capsules per bottle, take one capsule twice a day. Cane in the left hand to use with left leg weight bearing. Follow-Up Instructions: No follow-ups on file.  Continue with PT for cervical spine. Use Biofreeze spray to help relieve pain in the neck.

## 2020-01-08 NOTE — Progress Notes (Signed)
Office Visit Note   Patient: Amber Yang           Date of Birth: 1930/10/14           MRN: [id number] Visit Date: 01/08/2020              Requested by: Amelia Jo, FNP [address] [place],  [place] [address] PCP: Amelia Jo, FNP   Assessment & Plan: Visit Diagnoses:  1. Spondylosis without myelopathy or radiculopathy, cervical region   2. Chronic pain of both shoulders   3. Spinal stenosis of lumbosacral region   4. Peripheral neuropathy, idiopathic     Plan: Knee is suffering from osteoarthritis, only real proven treatments areAvoid overhead lifting and overhead use of the arms. Do not lift greater than 5 lbs. Adjust head rest in vehicle to prevent hyperextension if rear ended. Take extra precautions to avoid falling.Well padded shoes help. Ice the knee that is suffering from osteoarthritis, only real proven treatments are Weight loss, avoid oral NSIADs like motrin, alleve and diclofenac and exercise. May use voltaren gel. Well padded shoes help. Ice the knee 2-3 times a day 15-20 mins at a time.-3 times a day 15-20 mins at a time. Hot showers in the AM.  Injection with steroid may be of benefit. Hemp CBD capsules, amazon.com 5,000-7,000 mg per bottle, 60 capsules per bottle, take one capsule twice a day. Cane in the left hand to use with left leg weight bearing. Follow-Up Instructions: No follow-ups on file.  Tylenol # 3 for more severe pain.  Continue with PT for cervical spine. Use Biofreeze spray to help relieve pain in the neck. Follow-Up Instructions: No follow-ups on file.   Orders:  No orders of the defined types were placed in this encounter.  No orders of the defined types were placed in this encounter.     Procedures: No procedures performed   Clinical Data: No additional findings.   Subjective: Chief Complaint  Patient presents with  . Neck - Follow-up  . Right Knee - Follow-up  . Left Knee - Follow-up    86 year old  female with history of bilateral knee OA and DDD cervical spine with spondylosis and lumbar fusion for L4-5 spondylolisthesis with adjacent level DDD and Had a laminectomy L3-4 previously and most recent MRI with L2-3 spinal stenosis due to epidural lipomatosis. Fusion level and L3-4 are well maintained. She is in a PT program and is better but still has intermittant right shoulder and right arm pain. Previous ACDF C3 to C6 with corpectomy at C4 and C5-6ACDF.  She is experiencing pain  Radiation into the right shoulder and right arm.    Review of Systems  Constitutional: Negative.   HENT: Negative.   Eyes: Negative.   Respiratory: Negative.   Cardiovascular: Negative.   Gastrointestinal: Negative.   Endocrine: Negative.   Genitourinary: Negative.   Musculoskeletal: Negative.   Skin: Negative.   Allergic/Immunologic: Negative.   Neurological: Negative.   Hematological: Negative.   Psychiatric/Behavioral: Negative.      Objective: Vital Signs: BP (!) 149/83 (BP Location: Left Arm, Patient Position: Sitting)   Pulse 76   Ht 5' (1.524 m)   Wt 113 lb (51.3 kg)   BMI 22.07 kg/m   Physical Exam Constitutional:      Appearance: She is well-developed.  HENT:     Head: Normocephalic and atraumatic.  Eyes:     Pupils: Pupils are equal, round, and reactive to light.  Pulmonary:     Effort: Pulmonary effort is normal.     Breath sounds: Normal breath sounds.  Abdominal:     General: Bowel sounds are normal.     Palpations: Abdomen is soft.  Musculoskeletal:     Cervical back: Normal range of motion and neck supple.     Lumbar back: Negative right straight leg raise test and negative left straight leg raise test.  Skin:    General: Skin is warm and dry.     Findings: Bruising (on eloquis) present.  Neurological:     Mental Status: She is alert and oriented to person, place, and time.  Psychiatric:        Behavior: Behavior normal.        Thought Content: Thought content  normal.        Judgment: Judgment normal.     Back Exam   Tenderness  The patient is experiencing tenderness in the cervical and lumbar.  Range of Motion  Extension: abnormal  Flexion: abnormal  Lateral bend right: abnormal  Lateral bend left: abnormal  Rotation right: abnormal  Rotation left: abnormal   Muscle Strength  Right Quadriceps:  5/5  Left Quadriceps:  5/5  Right Hamstrings:  5/5  Left Hamstrings:  5/5   Tests  Straight leg raise right: negative Straight leg raise left: negative  Reflexes  Patellar: 1/4 Achilles: 1/4 Biceps: 1/4  Other  Toe walk: normal Heel walk: normal Sensation: normal Gait: normal  Erythema: no back redness Scars: absent      Specialty Comments:  No specialty comments available.  Imaging: No results found.   PMFS History: Patient Active Problem List   Diagnosis Date Noted  . Postoperative anemia due to acute blood loss 11/23/2013    Priority: Low    Class: Acute  . Shoulder tendinitis 10/05/2014  . Spinal stenosis, lumbar region, with neurogenic claudication 11/21/2013   Past Medical History:  Diagnosis Date  . Anxiety   . Asthma   . Constipation   . COPD (chronic obstructive pulmonary disease) ([facility])   . COPD, severe ([facility])   . Coronary artery disease   . DDD (degenerative disc disease)   . GERD (gastroesophageal reflux disease)   . H/O echocardiogram 01/27/2013  . Hearing loss   . History of colon polyps   . History of nuclear stress test 12/20/2012  . Hyperlipemia, mixed   . Hypertension   . Hypothyroidism   . Mitral valve regurgitation   . Nocturnal leg cramps   . Peripheral vascular disease ([facility])    followed by Dr Sondra Come  . Rheumatoid arthritis ([facility])   . Shortness of breath    exertional  . UTI (urinary tract infection) 05/2013    History reviewed. No pertinent family history.  Past Surgical History:  Procedure Laterality Date  . ABDOMINAL HYSTERECTOMY    . CAROTID ENDARTERECTOMY Right 05/2013  .  CATARACT EXTRACTION, BILATERAL    . CERVICAL FUSION  2012  . COLONOSCOPY W/ POLYPECTOMY  2008  . EYE SURGERY    . FOOT SURGERY Right   . VASCULAR SURGERY     Social History   Occupational History  . Not on file  Tobacco Use  . Smoking status: Former Smoker    Packs/day: 1.00    Years: 40.00    Pack years: 40.00    Types: Cigarettes    Quit date: 11/15/2003    Years since quitting: 16.1  . Smokeless tobacco: Never Used  Substance and Sexual Activity  .  Alcohol use: No  . Drug use: No  . Sexual activity: Never

## 2020-01-10 ENCOUNTER — Other Ambulatory Visit: Payer: Self-pay | Admitting: Specialist

## 2020-01-16 ENCOUNTER — Other Ambulatory Visit: Payer: Self-pay

## 2020-01-16 ENCOUNTER — Ambulatory Visit: Payer: Medicare Other | Admitting: Physical Therapy

## 2020-01-16 ENCOUNTER — Encounter: Payer: Self-pay | Admitting: Physical Therapy

## 2020-01-16 DIAGNOSIS — M542 Cervicalgia: Secondary | ICD-10-CM | POA: Diagnosis not present

## 2020-01-16 DIAGNOSIS — R2681 Unsteadiness on feet: Secondary | ICD-10-CM

## 2020-01-16 DIAGNOSIS — R293 Abnormal posture: Secondary | ICD-10-CM

## 2020-01-16 DIAGNOSIS — R296 Repeated falls: Secondary | ICD-10-CM

## 2020-01-16 DIAGNOSIS — M6281 Muscle weakness (generalized): Secondary | ICD-10-CM

## 2020-01-16 NOTE — Therapy (Signed)
[facility] [address]  [place], [place], [address] Phone: [phone]   Fax:  [phone]  Physical Therapy Discharge Summary  Patient Details  Name: Amber Yang MRN: [id number] Date of Birth: September 25, 1930 Referring Provider (PT): Basil Dess, MD    Progress Note Reporting Period 12/05/19 to 01/16/20  See note below for Objective Data and Assessment of Progress/Goals.    Encounter Date: 01/16/2020   PT End of Session - 01/16/20 1458    Visit Number 6    Number of Visits 9    Date for PT Re-Evaluation 01/30/20    Authorization Type Medicare & BCBS    PT Start Time 1310    PT Stop Time 1404    PT Time Calculation (min) 54 min    Equipment Utilized During Treatment Gait belt    Activity Tolerance Patient tolerated treatment well    Behavior During Therapy WFL for tasks assessed/performed           Past Medical History:  Diagnosis Date  . Anxiety   . Asthma   . Constipation   . COPD (chronic obstructive pulmonary disease) ([facility])   . COPD, severe ([place])   . Coronary artery disease   . DDD (degenerative disc disease)   . GERD (gastroesophageal reflux disease)   . H/O echocardiogram 01/27/2013  . Hearing loss   . History of colon polyps   . History of nuclear stress test 12/20/2012  . Hyperlipemia, mixed   . Hypertension   . Hypothyroidism   . Mitral valve regurgitation   . Nocturnal leg cramps   . Peripheral vascular disease ([facility])    followed by Dr Maryjean Morn  . Rheumatoid arthritis ([place])   . Shortness of breath    exertional  . UTI (urinary tract infection) 05/2013    Past Surgical History:  Procedure Laterality Date  . ABDOMINAL HYSTERECTOMY    . CAROTID ENDARTERECTOMY Right 05/2013  . CATARACT EXTRACTION, BILATERAL    . CERVICAL FUSION  2012  . COLONOSCOPY W/ POLYPECTOMY  2008  . EYE SURGERY    . FOOT SURGERY Right   . VASCULAR SURGERY      There were no vitals filed for this visit.    Subjective Assessment - 01/16/20 1312    Subjective Recent MD appointment went well. Back and neck have been bothering her but not today. Denies recent falls or close calls. Reports that she would like to wrap up with PT at this time d/t having to drive from her home in [place].    Pertinent History exertional SOB, RA, PVD, hypothyroidism, HTN, HLD, GERD, DDD, COPD, asthma, anxiety, cervical fusion 2012, R foot surgery    Diagnostic tests none recent    Patient Stated Goals get rid of pain    Currently in Pain? No/denies              [facility] PT Assessment - 01/16/20 0001      Observation/Other Assessments   Focus on Therapeutic Outcomes (FOTO)  Cervical: 65% (35% limited, 44% predicted)      AROM   Cervical Flexion 44    Cervical Extension 35    Cervical - Right Side Bend 27    Cervical - Left Side Bend 25    Cervical - Right Rotation 42    Cervical - Left Rotation 41      Strength   Right Shoulder Flexion 4+/5    Right Shoulder ABduction 4+/5  Right Shoulder Internal Rotation 4+/5    Right Shoulder External Rotation 4/5    Left Shoulder Flexion 4+/5    Left Shoulder ABduction 4+/5    Left Shoulder Internal Rotation 4/5    Left Shoulder External Rotation 4/5      Berg Balance Test   Sit to Stand Able to stand without using hands and stabilize independently    Standing Unsupported Able to stand safely 2 minutes    Sitting with Back Unsupported but Feet Supported on Floor or Stool Able to sit safely and securely 2 minutes    Stand to Sit Sits safely with minimal use of hands    Transfers Able to transfer safely, minor use of hands    Standing Unsupported with Eyes Closed Able to stand 10 seconds safely    Standing Unsupported with Feet Together Able to place feet together independently and stand 1 minute safely    From Standing, Reach Forward with Outstretched Arm Can reach forward >12 cm safely (5")    From Standing Position, Pick up Object from Floor Able to pick up shoe,  needs supervision    From Standing Position, Turn to Look Behind Over each Shoulder Turn sideways only but maintains balance    Turn 360 Degrees Able to turn 360 degrees safely but slowly    Standing Unsupported, Alternately Place Feet on Step/Stool Needs assistance to keep from falling or unable to try    Standing Unsupported, One Foot in [employer] balance while stepping or standing    Standing on One Leg Tries to lift leg/unable to hold 3 seconds but remains standing independently    Total Score 39                         [facility] Adult PT Treatment/Exercise - 01/16/20 0001      Neuro Re-ed    Neuro Re-ed Details  review of HEP update at counter top for good understanding/carryover including 1/2 tandem balance, toe tap on cone, anterior/posterior weight shifts      Neck Exercises: Machines for Strengthening   Nustep L4 x 6 min (UEs/LEs)      Neck Exercises: Seated   Money 10 reps    Money Limitations yellow TB; sitting with chair w/ backrest    Other Seated Exercise red TB row x10   cues to depress and retract scapulae                 PT Education - 01/16/20 1455    Education Details discussion on objective progress and remaining impairments; update/consolidation of HEP; encouraged patient to maintain a consistent fitness regimen at home and encouraged patient to reach out to her PCP if imbalance worsens- not to wait until a fall occurs    Person(s) Educated Patient    Methods Explanation;Demonstration;Tactile cues;Verbal cues;Handout    Comprehension Verbalized understanding;Returned demonstration            PT Short Term Goals - 01/16/20 1316      PT SHORT TERM GOAL #1   Title Pt will be independent with HEP    Time 3    Period Weeks    Status Achieved    Target Date 12/26/19             PT Long Term Goals - 01/16/20 1316      PT LONG TERM GOAL #1   Title Pt will decrease normal TUG time by > 2 seconds to decrease risk  for falls    Time 8     Period Weeks    Status Achieved      PT LONG TERM GOAL #2   Title Patient to report 70% improvement in neck pain.    Time 8    Period Weeks    Status Achieved   reports 75% improvement     PT LONG TERM GOAL #3   Title Patient to demonstrate cervical AROM WFL without pain limiting.    Time 8    Period Weeks    Status Partially Met   improved in flexion and without pain remaining, but slide decline in other planes     PT LONG TERM GOAL #4   Title Patient to demonstrate B shoulder strength >/=4+/5.    Time 8    Period Weeks    Status Partially Met   improved in R shoulder flexion, abduction, ER     PT LONG TERM GOAL #5   Title Pt will improve Berg score to 48/50 to improve ability to perform ADLs without LOB    Time 8    Period Weeks    Status Partially Met   39/56                Plan - 01/16/20 1506    Clinical Impression Statement Patient reporting recently having pain in the cervical and lumbar spine, but without c/o pain today. Denies recent fall. Reports compliance with HEP and denies issues. Notes that she would like to wrap up with therapy today d/t having to drive from her home in [place]. Patient now reports 75% improvement in cervical pain. Strength testing revealed improvement in R shoulder flexion, abduction, and ER. Cervical AROM has improved in flexion, however with small decline in other planes. Patient demonstrated a decline in Berg score today d/t increased imbalance with dynamic activities. Patient denied noticing worsening balance and suggests that she's having a "bad day" d/t the weather. D/t patient's balance decline and want to D/C from therapy today, took time to encourage patient to maintain a consistent fitness regimen and to reach out to her PCP if she does notice worsening of her balance rather than wait for a fall to occur. Also updated HEP with exercises that patient reported good understanding of to further address remaining impairments. Patient  reported understanding of edu provided today and without complaints at end of session. Patient now ready for D/C with transition to home program per patient's request.    Comorbidities exertional SOB, RA, PVD, hypothyroidism, HTN, HLD, GERD, DDD, COPD, asthma, anxiety, cervical fusion 2012, R foot surgery    Rehab Potential Good    PT Frequency 1x / week    PT Treatment/Interventions ADLs/Self Care Home Management;Cryotherapy;Electrical Stimulation;Moist Heat;Balance training;Therapeutic exercise;Therapeutic activities;Functional mobility training;Stair training;Gait training;Ultrasound;Neuromuscular re-education;Patient/family education;Manual techniques;Taping;Energy conservation;Dry needling;Passive range of motion    PT Next Visit Plan DC at this time    Consulted and Agree with Plan of Care Patient           Patient will benefit from skilled therapeutic intervention in order to improve the following deficits and impairments:  Hypomobility, Pain, Impaired UE functional use, Increased fascial restricitons, Decreased strength, Decreased activity tolerance, Decreased balance, Difficulty walking, Increased muscle spasms, Decreased range of motion, Improper body mechanics, Postural dysfunction, Impaired flexibility  Visit Diagnosis: Cervicalgia  Unsteadiness on feet  Repeated falls  Abnormal posture  Muscle weakness (generalized)     Problem List Patient Active Problem List   Diagnosis Date Noted  .  Shoulder tendinitis 10/05/2014  . Postoperative anemia due to acute blood loss 11/23/2013    Class: Acute  . Spinal stenosis, lumbar region, with neurogenic claudication 11/21/2013    PHYSICAL THERAPY DISCHARGE SUMMARY  Visits from Start of Care: 6  Current functional level related to goals / functional outcomes: See above clinical impression   Remaining deficits: Decreased cervical ROM, shoulder strength, balance   Education / Equipment: HEP  Plan: Patient agrees to  discharge.  Patient goals were partially met. Patient is being discharged due to the patient's request.  ?????     Janene Harvey, PT, DPT 01/16/20 3:09 PM   [facility] [address]  [place], [place], [address] Phone: [phone]   Fax:  [phone]  Name: RUWAYDA CURET MRN: [id number] Date of Birth: July 20, 1930

## 2020-02-16 ENCOUNTER — Other Ambulatory Visit: Payer: Self-pay | Admitting: Specialist

## 2020-03-07 ENCOUNTER — Other Ambulatory Visit: Payer: Self-pay | Admitting: Specialist

## 2020-03-28 ENCOUNTER — Other Ambulatory Visit: Payer: Self-pay | Admitting: Specialist

## 2020-04-08 ENCOUNTER — Ambulatory Visit (INDEPENDENT_AMBULATORY_CARE_PROVIDER_SITE_OTHER): Payer: Medicare Other | Admitting: Specialist

## 2020-04-08 ENCOUNTER — Other Ambulatory Visit: Payer: Self-pay

## 2020-04-08 ENCOUNTER — Encounter: Payer: Self-pay | Admitting: Specialist

## 2020-04-08 VITALS — BP 121/65 | HR 82 | Ht 60.0 in | Wt 113.0 lb

## 2020-04-08 DIAGNOSIS — M4807 Spinal stenosis, lumbosacral region: Secondary | ICD-10-CM

## 2020-04-08 DIAGNOSIS — M1712 Unilateral primary osteoarthritis, left knee: Secondary | ICD-10-CM | POA: Diagnosis not present

## 2020-04-08 DIAGNOSIS — M47812 Spondylosis without myelopathy or radiculopathy, cervical region: Secondary | ICD-10-CM

## 2020-04-08 DIAGNOSIS — Z981 Arthrodesis status: Secondary | ICD-10-CM | POA: Diagnosis not present

## 2020-04-08 MED ORDER — BUPIVACAINE HCL 0.5 % IJ SOLN
3.0000 mL | INTRAMUSCULAR | Status: AC | PRN
  Administered 2020-04-08: 14:00:00 3 mL via INTRA_ARTICULAR

## 2020-04-08 MED ORDER — METHYLPREDNISOLONE ACETATE 40 MG/ML IJ SUSP
40.0000 mg | INTRAMUSCULAR | Status: AC | PRN
  Administered 2020-04-08: 14:00:00 40 mg via INTRA_ARTICULAR

## 2020-04-08 MED ORDER — BIOFREEZE ROLL-ON 4 % EX GEL: CUTANEOUS | 2 refills | Status: AC

## 2020-04-08 NOTE — Addendum Note (Signed)
Addended by: Vira Browns on: 04/08/2020 02:33 PM   Modules accepted: Orders

## 2020-04-08 NOTE — Patient Instructions (Addendum)
Plan:Avoid bending, stooping and avoid lifting weights greater than 10 lbs. Avoid prolong standing and walking. Avoid frequent bending and stooping  No lifting greater than 10 lbs. May use ice or moist heat for pain. Weight loss is of benefit. Handicap license is approved. The main ways of treat osteoarthritis, that are found to be success. Weight loss helps to decrease pain. Exercise is important to maintaining cartilage and thickness and strengthening. Do not take NSAIDs like motrin or alleve are meds decreasing the inflamation as these should not be taken with eloquis due to  Risk of bleeding.  Ice is okay  In afternoon and evening and hot shower in the am Avoid overhead lifting and overhead use of the arms. Do not lift greater than 5 lbs. Adjust head rest in vehicle to prevent hyperextension if rear ended. Take extra precautions to avoid falling, including use of a cane if you feel weak. CT of the cervical spine ordered to assess for adjacent level spondylosis and the state of the multiple level cervical fusion.

## 2020-04-08 NOTE — Progress Notes (Addendum)
Office Visit Note   Patient: Amber Yang           Date of Birth: May 02, 1930           MRN: [id number] Visit Date: 04/08/2020              Requested by: Amelia Jo, FNP [address] [place],  [place] [address] PCP: Amelia Jo, FNP   Assessment & Plan: Visit Diagnoses:  1. Unilateral primary osteoarthritis, left knee   2. Spondylosis without myelopathy or radiculopathy, cervical region   3. History of fusion of cervical spine   4. Spinal stenosis of lumbosacral region     Plan:Avoid bending, stooping and avoid lifting weights greater than 10 lbs. Avoid prolong standing and walking. Avoid frequent bending and stooping  No lifting greater than 10 lbs. May use ice or moist heat for pain. Weight loss is of benefit. Handicap license is approved. The main ways of treat osteoarthritis, that are found to be success. Weight loss helps to decrease pain. Exercise is important to maintaining cartilage and thickness and strengthening. Do not take NSAIDs like motrin or alleve are meds decreasing the inflamation as these should not be taken with eloquis due to  Risk of bleeding.  Ice is okay  In afternoon and evening and hot shower in the am Avoid overhead lifting and overhead use of the arms. Do not lift greater than 5 lbs. Adjust head rest in vehicle to prevent hyperextension if rear ended. Take extra precautions to avoid falling, including use of a cane if you feel weak. CT of the cervical spine ordered to assess for adjacent level spondylosis and the state of the multiple level cervical fusion.   Orders:  Orders Placed This Encounter  Procedures  . Large Joint Inj: L knee   Meds ordered this encounter  Medications  . methylPREDNISolone acetate (DEPO-MEDROL) injection 40 mg  . bupivacaine (MARCAINE) 0.5 % (with pres) injection 3 mL      Procedures: Large Joint Inj: L knee on 04/08/2020 2:18 PM Indications: pain Details: 25 G 1.5 in needle, anterolateral  approach  Arthrogram: No  Medications: 40 mg methylPREDNISolone acetate 40 MG/ML; 3 mL bupivacaine 0.5 % Outcome: tolerated well, no immediate complications Procedure, treatment alternatives, risks and benefits explained, specific risks discussed. Consent was given by the patient. Immediately prior to procedure a time out was called to verify the correct patient, procedure, equipment, support staff and site/side marked as required. Patient was prepped and draped in the usual sterile fashion.       Clinical Data: No additional findings.   Subjective: Chief Complaint  Patient presents with  . Neck - Follow-up  . Lower Back - Follow-up    86 year old female with preexisting osteoarthritis left knee has had injections intermittantly last nearly one year ago. Pain with standing after sitting and first in the AM. Improves with walking. No numbness or paresthesias. No weakness.    Review of Systems  Constitutional: Negative.   HENT: Negative.   Eyes: Negative.   Respiratory: Negative.   Cardiovascular: Negative.   Gastrointestinal: Negative.   Endocrine: Negative.   Genitourinary: Negative.   Musculoskeletal: Negative.   Skin: Negative.   Allergic/Immunologic: Negative.   Neurological: Negative.   Hematological: Negative.   Psychiatric/Behavioral: Negative.      Objective: Vital Signs: BP 121/65 (BP Location: Left Arm, Patient Position: Sitting)   Pulse 82   Ht 5' (1.524 m)   Wt 113 lb (  51.3 kg)   BMI 22.07 kg/m   Physical Exam Constitutional:      Appearance: She is well-developed and well-nourished.  HENT:     Head: Normocephalic and atraumatic.  Eyes:     Extraocular Movements: EOM normal.     Pupils: Pupils are equal, round, and reactive to light.  Pulmonary:     Effort: Pulmonary effort is normal.     Breath sounds: Normal breath sounds.  Abdominal:     General: Bowel sounds are normal.     Palpations: Abdomen is soft.  Musculoskeletal:         General: Normal range of motion.     Cervical back: Normal range of motion and neck supple.     Right knee: Effusion present.     Instability Tests: Medial McMurray test negative and lateral McMurray test negative.  Skin:    General: Skin is warm and dry.  Neurological:     Mental Status: She is alert and oriented to person, place, and time.  Psychiatric:        Mood and Affect: Mood and affect normal.        Behavior: Behavior normal.        Thought Content: Thought content normal.        Judgment: Judgment normal.     Right Knee Exam  Right knee exam is normal.  Tenderness  The patient is experiencing tenderness in the medial joint line and patella.  Range of Motion  Extension: -5  Flexion: 130   Tests  McMurray:  Medial - negative Lateral - negative Varus: negative Valgus: negative Lachman:  Anterior - negative    Posterior - negative Pivot shift: 2+ Patellar apprehension: 2+  Other  Erythema: absent Scars: absent Sensation: normal Swelling: mild Effusion: effusion present      Specialty Comments:  No specialty comments available.  Imaging: No results found.   PMFS History: Patient Active Problem List   Diagnosis Date Noted  . Postoperative anemia due to acute blood loss 11/23/2013    Priority: Low    Class: Acute  . Shoulder tendinitis 10/05/2014  . Spinal stenosis, lumbar region, with neurogenic claudication 11/21/2013   Past Medical History:  Diagnosis Date  . Anxiety   . Asthma   . Constipation   . COPD (chronic obstructive pulmonary disease) ([facility])   . COPD, severe ([facility])   . Coronary artery disease   . DDD (degenerative disc disease)   . GERD (gastroesophageal reflux disease)   . H/O echocardiogram 01/27/2013  . Hearing loss   . History of colon polyps   . History of nuclear stress test 12/20/2012  . Hyperlipemia, mixed   . Hypertension   . Hypothyroidism   . Mitral valve regurgitation   . Nocturnal leg cramps   . Peripheral  vascular disease ([facility])    followed by Dr Sondra Come  . Rheumatoid arthritis ([facility])   . Shortness of breath    exertional  . UTI (urinary tract infection) 05/2013    History reviewed. No pertinent family history.  Past Surgical History:  Procedure Laterality Date  . ABDOMINAL HYSTERECTOMY    . CAROTID ENDARTERECTOMY Right 05/2013  . CATARACT EXTRACTION, BILATERAL    . CERVICAL FUSION  2012  . COLONOSCOPY W/ POLYPECTOMY  2008  . EYE SURGERY    . FOOT SURGERY Right   . VASCULAR SURGERY     Social History   Occupational History  . Not on file  Tobacco Use  . Smoking  status: Former Smoker    Packs/day: 1.00    Years: 40.00    Pack years: 40.00    Types: Cigarettes    Quit date: 11/15/2003    Years since quitting: 16.4  . Smokeless tobacco: Never Used  Substance and Sexual Activity  . Alcohol use: No  . Drug use: No  . Sexual activity: Never

## 2020-04-17 ENCOUNTER — Other Ambulatory Visit: Payer: Self-pay | Admitting: Specialist

## 2020-04-25 ENCOUNTER — Ambulatory Visit
Admission: RE | Admit: 2020-04-25 | Discharge: 2020-04-25 | Disposition: A | Discharge status: Home / Self Care | Payer: Medicare Other | Source: Ambulatory Visit | Attending: Specialist | Admitting: Specialist

## 2020-04-25 ENCOUNTER — Other Ambulatory Visit: Payer: Self-pay

## 2020-04-25 DIAGNOSIS — Z981 Arthrodesis status: Secondary | ICD-10-CM

## 2020-04-25 DIAGNOSIS — M47812 Spondylosis without myelopathy or radiculopathy, cervical region: Secondary | ICD-10-CM

## 2020-05-07 ENCOUNTER — Other Ambulatory Visit: Payer: Self-pay | Admitting: Specialist

## 2020-05-09 ENCOUNTER — Other Ambulatory Visit: Payer: Self-pay

## 2020-05-09 ENCOUNTER — Ambulatory Visit (INDEPENDENT_AMBULATORY_CARE_PROVIDER_SITE_OTHER): Payer: Medicare HMO | Admitting: Specialist

## 2020-05-09 ENCOUNTER — Encounter: Payer: Self-pay | Admitting: Specialist

## 2020-05-09 VITALS — BP 146/94 | HR 83 | Temp 97.8°F | Ht 60.0 in | Wt 113.0 lb

## 2020-05-09 DIAGNOSIS — M4807 Spinal stenosis, lumbosacral region: Secondary | ICD-10-CM

## 2020-05-09 DIAGNOSIS — M47812 Spondylosis without myelopathy or radiculopathy, cervical region: Secondary | ICD-10-CM

## 2020-05-09 DIAGNOSIS — W19XXXA Unspecified fall, initial encounter: Secondary | ICD-10-CM

## 2020-05-09 DIAGNOSIS — Z981 Arthrodesis status: Secondary | ICD-10-CM

## 2020-05-09 MED ORDER — METHYLPREDNISOLONE 4 MG PO TABS: ORAL_TABLET | ORAL | 0 refills | Status: AC

## 2020-05-09 NOTE — Progress Notes (Signed)
Office Visit Note   Patient: Amber Yang           Date of Birth: 17-Sep-1930           MRN: [id number] Visit Date: 05/09/2020              Requested by: Amelia Jo, FNP [address] [place],  [place] [address] PCP: Amelia Jo, FNP   Assessment & Plan: Visit Diagnoses:  1. Spondylosis without myelopathy or radiculopathy, cervical region   2. Spinal stenosis of lumbosacral region   3. History of fusion of cervical spine   4. Fall, initial encounter     Plan: Avoid overhead lifting and overhead use of the arms. Do not lift greater than 5 lbs. Adjust head rest in vehicle to prevent hyperextension if rear ended. Take extra precautions to avoid falling. Medrol 4 mg  Po qd for 14 days then 2mg  for 14 days. Return in 6 week  Follow-Up Instructions: Return in about 6 weeks (around 06/20/2020).   Orders:  No orders of the defined types were placed in this encounter.  No orders of the defined types were placed in this encounter.     Procedures: No procedures performed   Clinical Data: Findings:  Narrative & Impression CLINICAL DATA:  Spinal stenosis. History of cervical fusion. Neck pain.  EXAM: CT CERVICAL SPINE WITHOUT CONTRAST  TECHNIQUE: Multidetector CT imaging of the cervical spine was performed without intravenous contrast. Multiplanar CT image reconstructions were also generated.  COMPARISON:  Cervical spine radiographs 08/22/2018  FINDINGS: Alignment: Slight anterolisthesis C7-T1 otherwise normal alignment  Skull base and vertebrae: Negative for fracture or mass.  Hardware: ACDF C3 through C6 with anterior plate and screws. No screws at C4.  Soft tissues and spinal canal: Extensive atherosclerotic disease especially in the proximal left subclavian artery which may be occluded as well as severe atherosclerotic disease at the left carotid bifurcation. No mass or adenopathy.  Disc levels: C2-3: Disc and facet degeneration.  Negative for spinal or foraminal stenosis. Facets are fused.  C3-4: ACDF. Bilateral facet degeneration and fusion. Small central disc protrusion and mild spinal stenosis. Moderate left foraminal stenosis and mild right foraminal stenosis.  C4-5: ACDF. Moderate uncinate spurring with moderate foraminal stenosis bilaterally.  C5-6: ACDF. Diffuse uncinate spurring with moderate foraminal stenosis bilaterally and mild spinal stenosis.  C6-7: Disc degeneration with diffuse uncinate spurring. Moderate left foraminal narrowing and mild to moderate right foraminal narrowing due to spurring.  C7-T1: Mild anterolisthesis. Bilateral facet degeneration. Negative for stenosis.  Upper chest: Apical emphysema without acute abnormality.  Other: None  IMPRESSION: ACDF C3 through C6 with solid fusion at C3-4, C4-5, and C5-6.  Mild to moderate foraminal encroachment at multiple levels due to spurring.  Advanced atherosclerotic disease.   Electronically Signed   By: 08/24/2018 M.D.   On: 04/25/2020 16:58       Subjective: Chief Complaint  Patient presents with  . Neck - Follow-up    CT Scan Review    87 year old right handed female with history of lumbar fusion and C3-C6 fusion. She is experiencing lower back pain and pain into the right  Posterior cervical spine towards the upper levels. No bowel or bladder difficulty.    Review of Systems  Constitutional: Negative.   HENT: Negative.   Eyes: Negative.   Respiratory: Negative.   Cardiovascular: Negative.   Gastrointestinal: Negative.   Endocrine: Negative.   Genitourinary: Negative.   Musculoskeletal: Negative.  Skin: Negative.   Allergic/Immunologic: Negative.   Neurological: Negative.   Hematological: Negative.   Psychiatric/Behavioral: Negative.      Objective: Vital Signs: BP (!) 146/94 (BP Location: Left Arm, Patient Position: Sitting)   Pulse 83   Temp 97.8 F (36.6 C)   Ht 5' (1.524 m)    Wt 113 lb (51.3 kg)   BMI 22.07 kg/m   Physical Exam Constitutional:      Appearance: She is well-developed and well-nourished.  HENT:     Head: Normocephalic and atraumatic.  Eyes:     Extraocular Movements: EOM normal.     Pupils: Pupils are equal, round, and reactive to light.  Pulmonary:     Effort: Pulmonary effort is normal.     Breath sounds: Normal breath sounds.  Abdominal:     General: Bowel sounds are normal.     Palpations: Abdomen is soft.  Musculoskeletal:     Cervical back: Normal range of motion and neck supple.     Lumbar back: Negative right straight leg raise test and negative left straight leg raise test.  Skin:    General: Skin is warm and dry.  Neurological:     Mental Status: She is alert and oriented to person, place, and time.  Psychiatric:        Mood and Affect: Mood and affect normal.        Behavior: Behavior normal.        Thought Content: Thought content normal.        Judgment: Judgment normal.     Back Exam   Tenderness  The patient is experiencing tenderness in the cervical and lumbar.  Range of Motion  Extension: abnormal  Flexion: abnormal  Lateral bend right: abnormal  Lateral bend left: abnormal  Rotation right: abnormal  Rotation left: abnormal   Muscle Strength  Right Quadriceps:  5/5  Left Quadriceps:  5/5  Right Hamstrings:  5/5  Left Hamstrings:  5/5   Tests  Straight leg raise right: negative Straight leg raise left: negative  Other  Toe walk: abnormal Heel walk: abnormal Sensation: normal Erythema: no back redness Scars: absent      Specialty Comments:  No specialty comments available.  Imaging: No results found.   PMFS History: Patient Active Problem List   Diagnosis Date Noted  . Postoperative anemia due to acute blood loss 11/23/2013    Priority: Low    Class: Acute  . Shoulder tendinitis 10/05/2014  . Spinal stenosis, lumbar region, with neurogenic claudication 11/21/2013   Past  Medical History:  Diagnosis Date  . Anxiety   . Asthma   . Constipation   . COPD (chronic obstructive pulmonary disease) (HCC)   . COPD, severe (HCC)   . Coronary artery disease   . DDD (degenerative disc disease)   . GERD (gastroesophageal reflux disease)   . H/O echocardiogram 01/27/2013  . Hearing loss   . History of colon polyps   . History of nuclear stress test 12/20/2012  . Hyperlipemia, mixed   . Hypertension   . Hypothyroidism   . Mitral valve regurgitation   . Nocturnal leg cramps   . Peripheral vascular disease (HCC)    followed by Dr Sondra Come  . Rheumatoid arthritis (HCC)   . Shortness of breath    exertional  . UTI (urinary tract infection) 05/2013    No family history on file.  Past Surgical History:  Procedure Laterality Date  . ABDOMINAL HYSTERECTOMY    . CAROTID ENDARTERECTOMY  Right 05/2013  . CATARACT EXTRACTION, BILATERAL    . CERVICAL FUSION  2012  . COLONOSCOPY W/ POLYPECTOMY  2008  . EYE SURGERY    . FOOT SURGERY Right   . VASCULAR SURGERY     Social History   Occupational History  . Not on file  Tobacco Use  . Smoking status: Former Smoker    Packs/day: 1.00    Years: 40.00    Pack years: 40.00    Types: Cigarettes    Quit date: 11/15/2003    Years since quitting: 16.4  . Smokeless tobacco: Never Used  Substance and Sexual Activity  . Alcohol use: No  . Drug use: No  . Sexual activity: Never

## 2020-05-09 NOTE — Patient Instructions (Signed)
Avoid overhead lifting and overhead use of the arms. Do not lift greater than 5 lbs. Adjust head rest in vehicle to prevent hyperextension if rear ended. Take extra precautions to avoid falling. Medrol 4 mg  Po qd for 14 days then 2mg  for 14 days. Return in 6 week

## 2020-05-31 ENCOUNTER — Other Ambulatory Visit: Payer: Self-pay | Admitting: Specialist

## 2020-06-21 ENCOUNTER — Other Ambulatory Visit: Payer: Self-pay | Admitting: Specialist

## 2020-06-21 NOTE — Telephone Encounter (Signed)
Patient called. She would like a refill on Tylenol #3 call in. Her call back number is [phone]

## 2020-06-22 MED ORDER — ACETAMINOPHEN-CODEINE #3 300-30 MG PO TABS: ORAL_TABLET | ORAL | 0 refills | Status: DC

## 2020-06-24 ENCOUNTER — Other Ambulatory Visit: Payer: Self-pay

## 2020-06-24 ENCOUNTER — Ambulatory Visit (INDEPENDENT_AMBULATORY_CARE_PROVIDER_SITE_OTHER): Payer: Medicare HMO | Admitting: Specialist

## 2020-06-24 ENCOUNTER — Encounter: Payer: Self-pay | Admitting: Specialist

## 2020-06-24 VITALS — BP 120/71 | HR 66 | Ht 60.0 in | Wt 113.0 lb

## 2020-06-24 DIAGNOSIS — M1712 Unilateral primary osteoarthritis, left knee: Secondary | ICD-10-CM

## 2020-06-24 DIAGNOSIS — M170 Bilateral primary osteoarthritis of knee: Secondary | ICD-10-CM | POA: Diagnosis not present

## 2020-06-24 DIAGNOSIS — M1711 Unilateral primary osteoarthritis, right knee: Secondary | ICD-10-CM

## 2020-06-24 DIAGNOSIS — M660 Rupture of popliteal cyst: Secondary | ICD-10-CM | POA: Diagnosis not present

## 2020-06-24 NOTE — Patient Instructions (Signed)
Plan: Knee is suffering from osteoarthritis, only real proven treatments are Weight loss, NSIADs like diclofenac and exercise. Well padded shoes help. Ice the knee that is suffering from osteoarthritis, only real proven treatments are Weight loss, NSIADs like motrin or celebrex and exercise. Well padded shoes help. Ice the knee 2-3 times a day 15-20 mins at a time.-3 times a day 15-20 mins at a time. Hot showers in the AM.  Injection with steroid may be of benefit. Hemp CBD capsules, [email or web address] 5,000-7,000 mg per bottle, 60 capsules per bottle, take one capsule twice a day. Cane in the left hand to use with left leg weight bearing. Follow-Up Instructions: No follow-ups on file.

## 2020-06-24 NOTE — Progress Notes (Signed)
Office Visit Note   Patient: Amber Yang           Date of Birth: 1931-04-02           MRN: [id number] Visit Date: 06/24/2020              Requested by: Amelia Jo, FNP [address] [place],  [place] [address] PCP: Amelia Jo, FNP   Assessment & Plan: Visit Diagnoses:  1. Unilateral primary osteoarthritis, left knee   2. Unilateral primary osteoarthritis, right knee   3. Baker's cyst, ruptured     Plan: Knee is suffering from osteoarthritis, only real proven treatments are Weight loss, NSIADs like diclofenac and exercise. Well padded shoes help. Ice the knee that is suffering from osteoarthritis, only real proven treatments are Weight loss, NSIADs like motrin or celebrex and exercise. Well padded shoes help. Ice the knee 2-3 times a day 15-20 mins at a time.-3 times a day 15-20 mins at a time. Hot showers in the AM.  Injection with steroid may be of benefit. Hemp CBD capsules, [email or web address] 5,000-7,000 mg per bottle, 60 capsules per bottle, take one capsule twice a day. Cane in the left hand to use with left leg weight bearing. Follow-Up Instructions: No follow-ups on file.   Follow-Up Instructions: Return in about 3 months (around 09/21/2020).   Orders:  No orders of the defined types were placed in this encounter.  No orders of the defined types were placed in this encounter.     Procedures: No procedures performed   Clinical Data: No additional findings.   Subjective: Chief Complaint  Patient presents with  . Neck - Follow-up  . Lower Back - Follow-up    87 year old female with severe osteoarthritis of the knees, had injections done 05/10/2020 and she has had recurrent pain left and right knee.  Had a Baker's cyst that has gone on to rupture left posterolateral knee. Left knee with pain with standing.   Review of Systems  Constitutional: Negative.   HENT: Negative.   Eyes: Negative.   Respiratory: Negative.   Cardiovascular:  Negative.   Gastrointestinal: Negative.   Endocrine: Negative.   Genitourinary: Negative.   Musculoskeletal: Negative.   Skin: Negative.   Allergic/Immunologic: Negative.   Neurological: Negative.   Hematological: Negative.   Psychiatric/Behavioral: Negative.      Objective: Vital Signs: BP 120/71 (BP Location: Left Arm, Patient Position: Sitting)   Pulse 66   Ht 5' (1.524 m)   Wt 113 lb (51.3 kg)   BMI 22.07 kg/m   Physical Exam Constitutional:      Appearance: She is well-developed and well-nourished.  HENT:     Head: Normocephalic and atraumatic.  Eyes:     Extraocular Movements: EOM normal.     Pupils: Pupils are equal, round, and reactive to light.  Pulmonary:     Effort: Pulmonary effort is normal.     Breath sounds: Normal breath sounds.  Abdominal:     General: Bowel sounds are normal.     Palpations: Abdomen is soft.  Musculoskeletal:     Cervical back: Normal range of motion and neck supple.     Right knee:     Instability Tests: Medial McMurray test positive.     Left knee:     Instability Tests: Medial McMurray test positive.  Skin:    General: Skin is warm and dry.  Neurological:     Mental Status: She is alert and oriented  to person, place, and time.  Psychiatric:        Mood and Affect: Mood and affect normal.        Behavior: Behavior normal.        Thought Content: Thought content normal.        Judgment: Judgment normal.     Right Knee Exam   Range of Motion  Extension:  -5 abnormal  Flexion: 100   Tests  McMurray:  Medial - positive  Lachman:  Anterior - negative    Posterior - negative Drawer:  Anterior - negative    Posterior - negative  Other  Erythema: absent Scars: absent   Left Knee Exam   Range of Motion  Extension:  -15 abnormal  Flexion: 80   Tests  McMurray:  Medial - positive  Lachman:  Anterior - negative    Posterior - negative Drawer:  Anterior - negative     Posterior - negative  Other  Erythema:  absent Scars: absent      Specialty Comments:  No specialty comments available.  Imaging: No results found.   PMFS History: Patient Active Problem List   Diagnosis Date Noted  . Postoperative anemia due to acute blood loss 11/23/2013    Priority: Low    Class: Acute  . Shoulder tendinitis 10/05/2014  . Spinal stenosis, lumbar region, with neurogenic claudication 11/21/2013   Past Medical History:  Diagnosis Date  . Anxiety   . Asthma   . Constipation   . COPD (chronic obstructive pulmonary disease) (HCC)   . COPD, severe (HCC)   . Coronary artery disease   . DDD (degenerative disc disease)   . GERD (gastroesophageal reflux disease)   . H/O echocardiogram 01/27/2013  . Hearing loss   . History of colon polyps   . History of nuclear stress test 12/20/2012  . Hyperlipemia, mixed   . Hypertension   . Hypothyroidism   . Mitral valve regurgitation   . Nocturnal leg cramps   . Peripheral vascular disease (HCC)    followed by Dr Sondra Come  . Rheumatoid arthritis (HCC)   . Shortness of breath    exertional  . UTI (urinary tract infection) 05/2013    No family history on file.  Past Surgical History:  Procedure Laterality Date  . ABDOMINAL HYSTERECTOMY    . CAROTID ENDARTERECTOMY Right 05/2013  . CATARACT EXTRACTION, BILATERAL    . CERVICAL FUSION  2012  . COLONOSCOPY W/ POLYPECTOMY  2008  . EYE SURGERY    . FOOT SURGERY Right   . VASCULAR SURGERY     Social History   Occupational History  . Not on file  Tobacco Use  . Smoking status: Former Smoker    Packs/day: 1.00    Years: 40.00    Pack years: 40.00    Types: Cigarettes    Quit date: 11/15/2003    Years since quitting: 16.6  . Smokeless tobacco: Never Used  Substance and Sexual Activity  . Alcohol use: No  . Drug use: No  . Sexual activity: Never

## 2020-07-08 ENCOUNTER — Other Ambulatory Visit: Payer: Self-pay | Admitting: Specialist

## 2020-07-26 ENCOUNTER — Other Ambulatory Visit: Payer: Self-pay | Admitting: Radiology

## 2020-07-26 MED ORDER — ACETAMINOPHEN-CODEINE #3 300-30 MG PO TABS: ORAL_TABLET | ORAL | 0 refills | Status: DC

## 2020-08-16 ENCOUNTER — Other Ambulatory Visit: Payer: Self-pay | Admitting: Specialist

## 2020-09-03 ENCOUNTER — Other Ambulatory Visit: Payer: Self-pay | Admitting: Radiology

## 2020-09-03 NOTE — Telephone Encounter (Signed)
Patients pharmacy sent a paper request for refill.

## 2020-09-04 MED ORDER — ACETAMINOPHEN-CODEINE #3 300-30 MG PO TABS: ORAL_TABLET | ORAL | 0 refills | Status: DC

## 2020-09-27 ENCOUNTER — Other Ambulatory Visit: Payer: Self-pay | Admitting: Specialist

## 2020-09-27 NOTE — Telephone Encounter (Signed)
Please advise 

## 2020-10-17 ENCOUNTER — Other Ambulatory Visit: Payer: Self-pay | Admitting: Radiology

## 2020-10-17 MED ORDER — ACETAMINOPHEN-CODEINE #3 300-30 MG PO TABS: ORAL_TABLET | ORAL | 0 refills | Status: DC

## 2020-10-17 NOTE — Telephone Encounter (Signed)
Pharmacy sent paper request for refill on medicine

## 2020-11-08 ENCOUNTER — Other Ambulatory Visit: Payer: Self-pay | Admitting: Specialist

## 2020-12-02 ENCOUNTER — Other Ambulatory Visit: Payer: Self-pay | Admitting: Radiology

## 2020-12-02 MED ORDER — ACETAMINOPHEN-CODEINE #3 300-30 MG PO TABS: ORAL_TABLET | ORAL | 0 refills | Status: DC

## 2020-12-14 ENCOUNTER — Other Ambulatory Visit: Payer: Self-pay | Admitting: Specialist

## 2021-01-07 ENCOUNTER — Other Ambulatory Visit: Payer: Self-pay | Admitting: Specialist

## 2021-01-07 NOTE — Telephone Encounter (Signed)
Received call from Amber Yang-pharmacist with [facility] asking if Dr. Otelia Yang want patient to continue the Tylenol 3?    Patient received a 7 day supply of the medication. The number to contact [place] is [phone]

## 2021-01-10 MED ORDER — ACETAMINOPHEN-CODEINE #3 300-30 MG PO TABS: ORAL_TABLET | ORAL | 0 refills | Status: DC

## 2021-02-04 ENCOUNTER — Other Ambulatory Visit: Payer: Self-pay | Admitting: Specialist

## 2021-02-25 ENCOUNTER — Other Ambulatory Visit: Payer: Self-pay | Admitting: Radiology

## 2021-02-25 MED ORDER — ACETAMINOPHEN-CODEINE #3 300-30 MG PO TABS: ORAL_TABLET | ORAL | 0 refills | Status: DC

## 2021-03-14 ENCOUNTER — Other Ambulatory Visit: Payer: Self-pay | Admitting: Specialist

## 2021-03-14 NOTE — Telephone Encounter (Signed)
Based on rx  directions 1 every  12 hours as needed patient should have enough for 3 more days. So should be ok through the weekend

## 2021-03-18 ENCOUNTER — Other Ambulatory Visit: Payer: Self-pay | Admitting: Specialist

## 2021-04-26 IMAGING — CT CT CERVICAL SPINE W/O CM
2 series · 10 of 14 positions shown, 12 images · non-contrast
Comparison: Cervical spine radiographs 08/22/2018

CLINICAL DATA: Spinal stenosis. History of cervical fusion. Neck
pain.

EXAM:
CT CERVICAL SPINE WITHOUT CONTRAST
TECHNIQUE: Multidetector CT imaging of the cervical spine was performed without
intravenous contrast. Multiplanar CT image reconstructions were also
generated.

[Series 3: cspine soft · axial · 0.31mm/px · z∈[-253,-149]mm · 5 of 80 slices shown]
[im 14/80  soft-tissue]
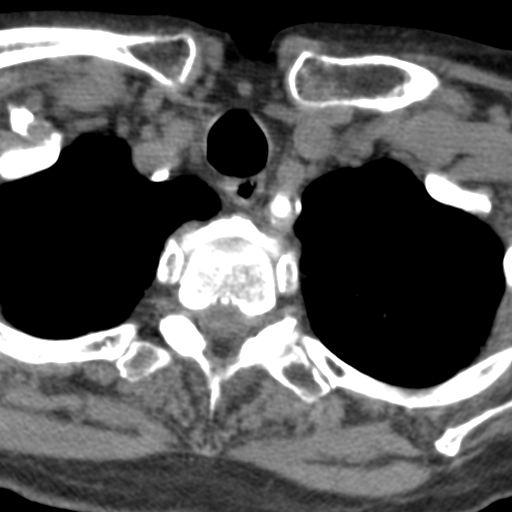
[im 27/80  soft-tissue]
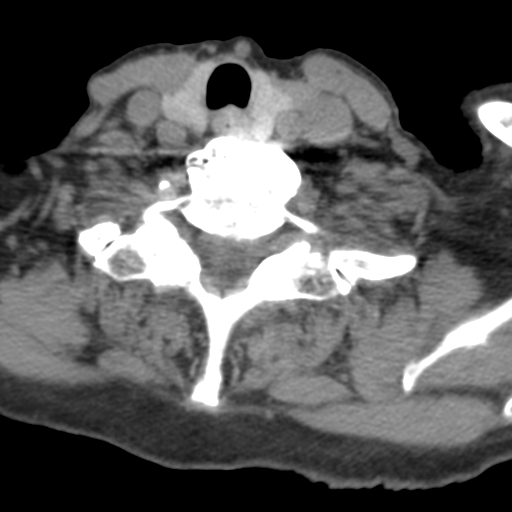
[im 40/80  soft-tissue]
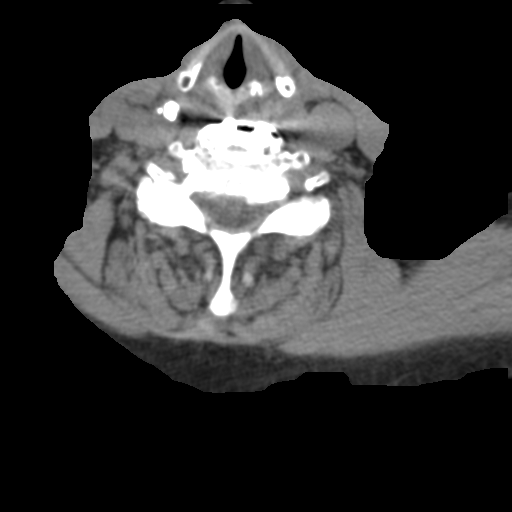
[im 53/80  soft-tissue]
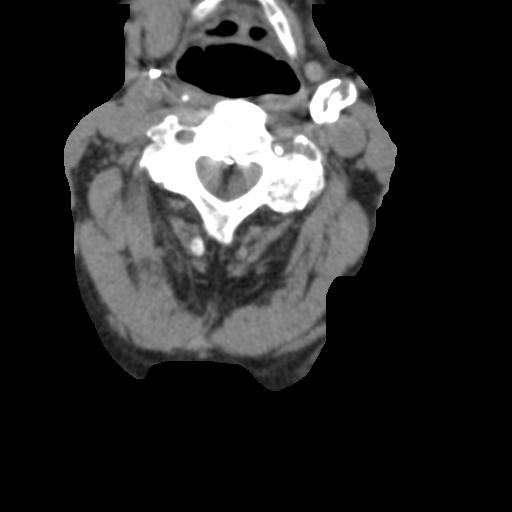
[im 66/80  soft-tissue]
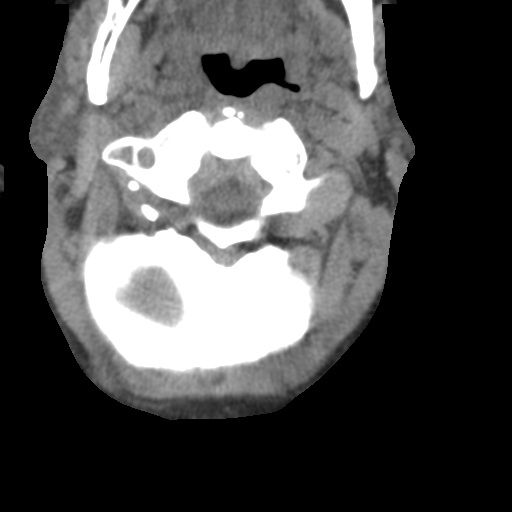

[Series 9: angled axial · axial · 0.29mm/px · z∈[-286,-174]mm · 5 of 91 slices shown, 7 images]
[im 16/91  soft-tissue]
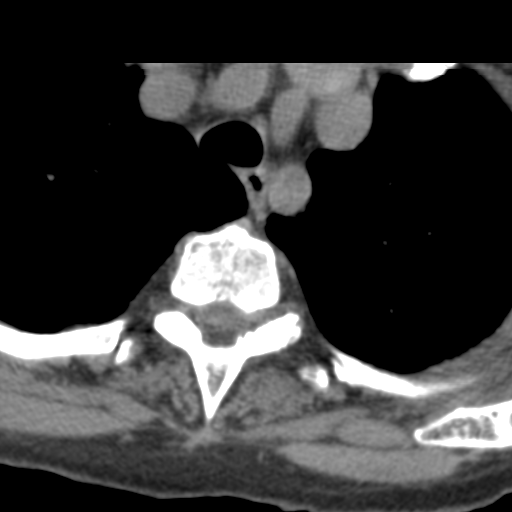
[im 16/91  bone]
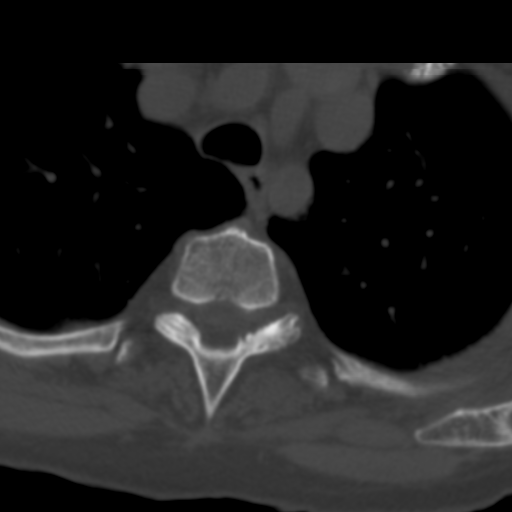
[im 31/91  bone]
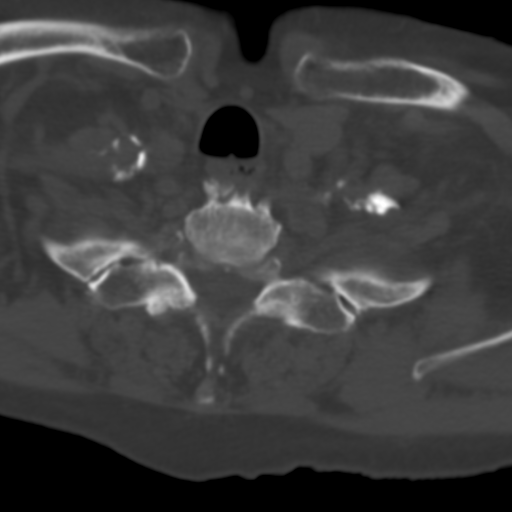
[im 46/91  bone]
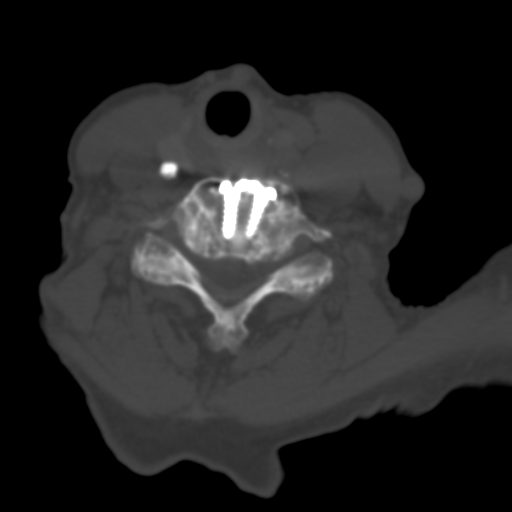
[im 61/91  bone]
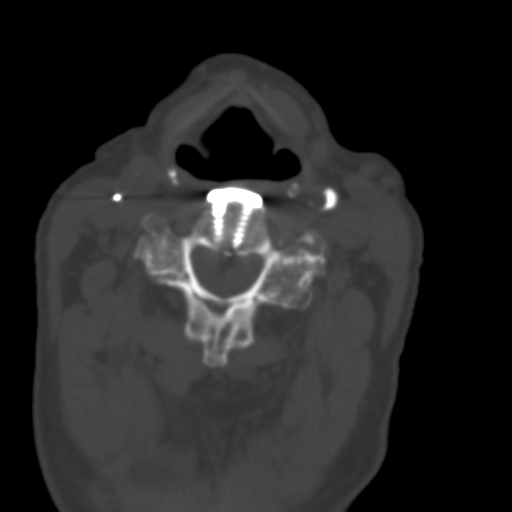
[im 76/91  soft-tissue]
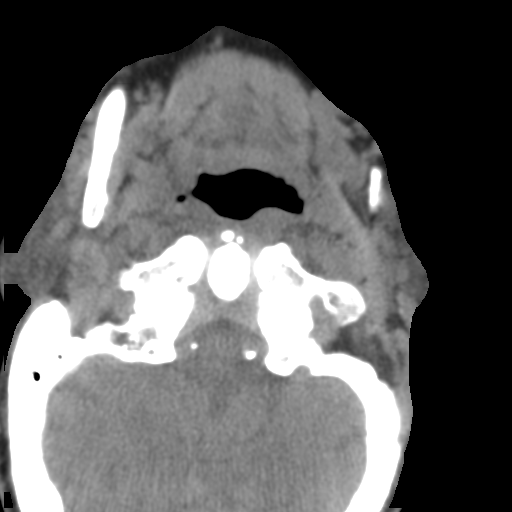
[im 76/91  bone]
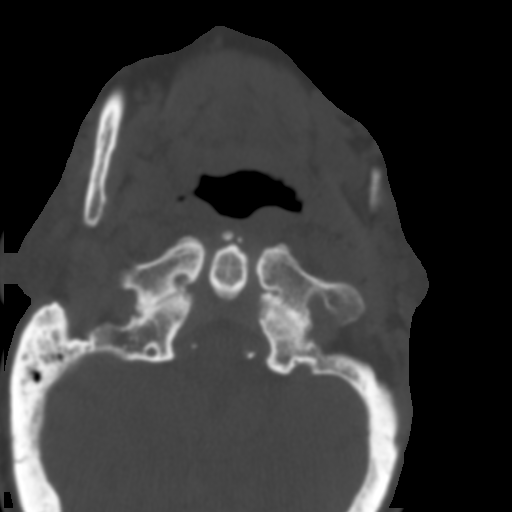

[10 of 14 positions shown; findings below may reference images not displayed]

FINDINGS: Alignment: Slight anterolisthesis C7-T1 otherwise normal alignment

Skull base and vertebrae: Negative for fracture or mass.

Hardware: ACDF C3 through C6 with anterior plate and screws. No
screws at C4.

Soft tissues and spinal canal: Extensive atherosclerotic disease
especially in the proximal left subclavian artery which may be
occluded as well as severe atherosclerotic disease at the left
carotid bifurcation. No mass or adenopathy.

Disc levels: C2-3: Disc and facet degeneration. Negative for spinal
or foraminal stenosis. Facets are fused.

C3-4: ACDF. Bilateral facet degeneration and fusion. Small central
disc protrusion and mild spinal stenosis. Moderate left foraminal
stenosis and mild right foraminal stenosis.

C4-5: ACDF. Moderate uncinate spurring with moderate foraminal
stenosis bilaterally.

C5-6: ACDF. Diffuse uncinate spurring with moderate foraminal
stenosis bilaterally and mild spinal stenosis.

C6-7: Disc degeneration with diffuse uncinate spurring. Moderate
left foraminal narrowing and mild to moderate right foraminal
narrowing due to spurring.

C7-T1: Mild anterolisthesis. Bilateral facet degeneration. Negative
for stenosis.

Upper chest: Apical emphysema without acute abnormality.

Other: None
IMPRESSION: ACDF C3 through C6 with solid fusion at C3-4, C4-5, and C5-6.

Mild to moderate foraminal encroachment at multiple levels due to
spurring.

Advanced atherosclerotic disease.

## 2021-06-02 ENCOUNTER — Other Ambulatory Visit: Payer: Self-pay | Admitting: Radiology

## 2021-06-02 MED ORDER — ACETAMINOPHEN-CODEINE #3 300-30 MG PO TABS: ORAL_TABLET | ORAL | 0 refills | Status: AC

## 2022-12-27 DEATH — deceased
